# Patient Record
Sex: Female | Born: 1955 | Race: White | Hispanic: No | Marital: Married | Smoking: Former smoker
Health system: Southern US, Community
[De-identification: ages and names within clinical notes are randomized; demographics above are authoritative.]

## PROBLEM LIST (undated history)

## (undated) DIAGNOSIS — B029 Zoster without complications: Secondary | ICD-10-CM

## (undated) DIAGNOSIS — R918 Other nonspecific abnormal finding of lung field: Secondary | ICD-10-CM

## (undated) DIAGNOSIS — F32A Depression, unspecified: Secondary | ICD-10-CM

## (undated) DIAGNOSIS — E785 Hyperlipidemia, unspecified: Secondary | ICD-10-CM

## (undated) DIAGNOSIS — T7840XA Allergy, unspecified, initial encounter: Secondary | ICD-10-CM

## (undated) DIAGNOSIS — F329 Major depressive disorder, single episode, unspecified: Secondary | ICD-10-CM

## (undated) DIAGNOSIS — C801 Malignant (primary) neoplasm, unspecified: Secondary | ICD-10-CM

## (undated) DIAGNOSIS — F419 Anxiety disorder, unspecified: Secondary | ICD-10-CM

## (undated) HISTORY — DX: Hyperlipidemia, unspecified: E78.5

## (undated) HISTORY — PX: TONSILLECTOMY AND ADENOIDECTOMY: SUR1326

## (undated) HISTORY — DX: Major depressive disorder, single episode, unspecified: F32.9

## (undated) HISTORY — PX: RIGHT OOPHORECTOMY: SHX2359

## (undated) HISTORY — DX: Anxiety disorder, unspecified: F41.9

## (undated) HISTORY — DX: Depression, unspecified: F32.A

## (undated) HISTORY — DX: Allergy, unspecified, initial encounter: T78.40XA

## (undated) HISTORY — DX: Other nonspecific abnormal finding of lung field: R91.8

## (undated) HISTORY — DX: Zoster without complications: B02.9

## (undated) HISTORY — PX: WISDOM TOOTH EXTRACTION: SHX21

---

## 1998-05-29 ENCOUNTER — Other Ambulatory Visit: Admission: RE | Admit: 1998-05-29 | Discharge: 1998-05-29 | Payer: Self-pay | Admitting: Obstetrics and Gynecology

## 1999-06-13 ENCOUNTER — Other Ambulatory Visit: Admission: RE | Admit: 1999-06-13 | Discharge: 1999-06-13 | Payer: Self-pay | Admitting: Obstetrics and Gynecology

## 2000-06-20 ENCOUNTER — Other Ambulatory Visit: Admission: RE | Admit: 2000-06-20 | Discharge: 2000-06-20 | Payer: Self-pay | Admitting: Obstetrics and Gynecology

## 2001-07-02 ENCOUNTER — Other Ambulatory Visit: Admission: RE | Admit: 2001-07-02 | Discharge: 2001-07-02 | Payer: Self-pay | Admitting: Obstetrics and Gynecology

## 2002-07-06 ENCOUNTER — Other Ambulatory Visit: Admission: RE | Admit: 2002-07-06 | Discharge: 2002-07-06 | Payer: Self-pay | Admitting: Obstetrics and Gynecology

## 2003-09-01 ENCOUNTER — Other Ambulatory Visit: Admission: RE | Admit: 2003-09-01 | Discharge: 2003-09-01 | Payer: Self-pay | Admitting: Obstetrics and Gynecology

## 2004-05-15 ENCOUNTER — Encounter: Admission: RE | Admit: 2004-05-15 | Discharge: 2004-05-15 | Payer: Self-pay | Admitting: Obstetrics and Gynecology

## 2004-09-11 ENCOUNTER — Other Ambulatory Visit: Admission: RE | Admit: 2004-09-11 | Discharge: 2004-09-11 | Payer: Self-pay | Admitting: Obstetrics and Gynecology

## 2005-07-15 ENCOUNTER — Encounter: Admission: RE | Admit: 2005-07-15 | Discharge: 2005-07-15 | Payer: Self-pay | Admitting: Otolaryngology

## 2005-10-08 ENCOUNTER — Other Ambulatory Visit: Admission: RE | Admit: 2005-10-08 | Discharge: 2005-10-08 | Payer: Self-pay | Admitting: Obstetrics and Gynecology

## 2006-07-16 ENCOUNTER — Encounter: Admission: RE | Admit: 2006-07-16 | Discharge: 2006-07-16 | Payer: Self-pay | Admitting: Obstetrics and Gynecology

## 2006-10-14 ENCOUNTER — Other Ambulatory Visit: Admission: RE | Admit: 2006-10-14 | Discharge: 2006-10-14 | Payer: Self-pay | Admitting: Obstetrics and Gynecology

## 2008-06-24 HISTORY — PX: DENTAL SURGERY: SHX609

## 2008-09-14 ENCOUNTER — Encounter: Admission: RE | Admit: 2008-09-14 | Discharge: 2008-09-14 | Payer: Self-pay | Admitting: Family Medicine

## 2008-10-26 ENCOUNTER — Encounter: Admission: RE | Admit: 2008-10-26 | Discharge: 2008-10-26 | Payer: Self-pay | Admitting: Family Medicine

## 2008-11-08 ENCOUNTER — Encounter: Admission: RE | Admit: 2008-11-08 | Discharge: 2008-11-08 | Payer: Self-pay | Admitting: Family Medicine

## 2009-04-25 ENCOUNTER — Encounter: Admission: RE | Admit: 2009-04-25 | Discharge: 2009-04-25 | Payer: Self-pay | Admitting: Family Medicine

## 2009-11-15 ENCOUNTER — Encounter: Admission: RE | Admit: 2009-11-15 | Discharge: 2009-11-15 | Payer: Self-pay | Admitting: Family Medicine

## 2010-07-14 ENCOUNTER — Encounter: Payer: Self-pay | Admitting: Obstetrics and Gynecology

## 2010-07-15 ENCOUNTER — Encounter: Payer: Self-pay | Admitting: Orthopedic Surgery

## 2011-01-07 ENCOUNTER — Other Ambulatory Visit: Payer: Self-pay | Admitting: Family Medicine

## 2011-01-07 DIAGNOSIS — Z1231 Encounter for screening mammogram for malignant neoplasm of breast: Secondary | ICD-10-CM

## 2011-01-16 ENCOUNTER — Ambulatory Visit
Admission: RE | Admit: 2011-01-16 | Discharge: 2011-01-16 | Disposition: A | Discharge status: Home / Self Care | Payer: Private Health Insurance - Indemnity | Source: Ambulatory Visit | Attending: Family Medicine | Admitting: Family Medicine

## 2011-01-16 DIAGNOSIS — Z1231 Encounter for screening mammogram for malignant neoplasm of breast: Secondary | ICD-10-CM

## 2011-02-06 ENCOUNTER — Ambulatory Visit (AMBULATORY_SURGERY_CENTER): Payer: Private Health Insurance - Indemnity | Admitting: *Deleted

## 2011-02-06 VITALS — Ht 66.0 in | Wt 147.0 lb

## 2011-02-06 DIAGNOSIS — Z1211 Encounter for screening for malignant neoplasm of colon: Secondary | ICD-10-CM

## 2011-02-06 MED ORDER — PEG-KCL-NACL-NASULF-NA ASC-C 100 G PO SOLR: ORAL | Status: DC

## 2011-02-20 ENCOUNTER — Encounter: Payer: Self-pay | Admitting: Gastroenterology

## 2011-02-20 ENCOUNTER — Other Ambulatory Visit: Payer: Self-pay

## 2011-02-20 ENCOUNTER — Ambulatory Visit (AMBULATORY_SURGERY_CENTER): Payer: Private Health Insurance - Indemnity | Admitting: Gastroenterology

## 2011-02-20 VITALS — BP 125/77 | HR 65 | Temp 98.4°F | Resp 20 | Ht 66.0 in | Wt 147.0 lb

## 2011-02-20 DIAGNOSIS — D126 Benign neoplasm of colon, unspecified: Secondary | ICD-10-CM

## 2011-02-20 DIAGNOSIS — Z1211 Encounter for screening for malignant neoplasm of colon: Secondary | ICD-10-CM

## 2011-02-20 MED ORDER — HYDROCORTISONE ACE-PRAMOXINE 1-1 % RE CREA: TOPICAL_CREAM | Freq: Three times a day (TID) | RECTAL | Status: AC

## 2011-02-20 MED ORDER — PRAMOXINE-HC 1-2.5 % EX CREA: TOPICAL_CREAM | CUTANEOUS | Status: DC

## 2011-02-20 MED ORDER — SODIUM CHLORIDE 0.9 % IV SOLN: 500.0000 mL | INTRAVENOUS | Status: DC

## 2011-02-20 NOTE — Progress Notes (Signed)
Addended by: Harlow Mares D on: 02/20/2011 11:25 AM   Modules accepted: Orders

## 2011-02-20 NOTE — Patient Instructions (Signed)
Please refer to your blue and neon green sheets for instructions regarding diet and activity for the rest of today.  You may resume your medications as you would normally take them.  Hemorrhoids Hemorrhoids are dilated (enlarged) veins around the rectum. Sometimes clots will form in the veins. This makes them swollen and painful. These are called thrombosed hemorrhoids. Causes of hemorrhoids include:  Pregnancy: this increases the pressure in the hemorrhoidal veins.   Constipation.   Straining to have a bowel movement.  HOME CARE INSTRUCTIONS  Eat a well balanced diet and drink 6 to 8 glasses of water every day to avoid constipation. You may also use a bulk laxative.   Avoid straining to have bowel movements.   Keep anal area dry and clean.   Only take over-the-counter or prescription medicines for pain, discomfort, or fever as directed by your caregiver.  If thrombosed:  Take hot sitz baths for 20 to 30 minutes, 3 to 4 times per day.   If the hemorrhoids are very tender and swollen, place ice packs on area as tolerated. Using ice packs between sitz baths may be helpful. Fill a plastic bag with ice and use a towel between the bag of ice and your skin.   Special creams and suppositories (Anusol, Nupercainal, Wyanoids) may be used or applied as directed.   Do not use a donut shaped pillow or sit on the toilet for long periods. This increases blood pooling and pain.   Move your bowels when your body has the urge; this will require less straining and will decrease pain and pressure.   Only take over-the-counter or prescription medicines for pain, discomfort, or fever as directed by your caregiver.  SEEK MEDICAL CARE IF:  You have increasing pain and swelling that is not controlled with your prescription.   You have uncontrolled bleeding.   You have an inability or difficulty having a bowel movement.   You have pain or inflammation outside the area of the hemorrhoids.   You  have chills and/or an oral temperature that lasts for 2 days or longer, or as your caregiver suggests.  MAKE SURE YOU:   Understand these instructions.   Will watch your condition.   Will get help right away if you are not doing well or get worse.  Document Released: 06/07/2000 Document Re-Released: 05/23/2008 [facility] Patient Information 2011 [place], [place].  Polyps, Colon  A polyp is extra tissue that grows inside your body. Colon polyps grow in the large intestine. The large intestine, also called the colon, is part of your digestive system. It is a long, hollow tube at the end of your digestive tract where your body makes and stores stool. Most polyps are not dangerous. They are benign. This means they are not cancerous. But over time, some types of polyps can turn into cancer. Polyps that are smaller than a pea are usually not harmful. But larger polyps could someday become or may already be cancerous. To be safe, doctors remove all polyps and test them.  WHO GETS POLYPS? Anyone can get polyps, but certain people are more likely than others. You may have a greater chance of getting polyps if:  You are over 50.   You have had polyps before.   Someone in your family has had polyps.   Someone in your family has had cancer of the large intestine.   Find out if someone in your family has had polyps. You may also be more likely to get polyps if  you:   Eat a lot of fatty foods   Smoke   Drink alcohol   Do not exercise  Eat too much  SYMPTOMS Most small polyps do not cause symptoms. People often do not know they have one until their caregiver finds it during a regular checkup or while testing them for something else. Some people do have symptoms like these:  Bleeding from the anus. You might notice blood on your underwear or on toilet paper after you have had a bowel movement.   Constipation or diarrhea that lasts more than a week.   Blood in the stool. Blood can make stool look  black or it can show up as red streaks in the stool.  If you have any of these symptoms, see your caregiver. HOW DOES THE DOCTOR TEST FOR POLYPS? The doctor can use four tests to check for polyps:  Digital rectal exam. The caregiver wears gloves and checks your rectum (the last part of the large intestine) to see if it feels normal. This test would find polyps only in the rectum. Your caregiver may need to do one of the other tests listed below to find polyps higher up in the intestine.   Barium enema. The caregiver puts a liquid called barium into your rectum before taking x-rays of your large intestine. Barium makes your intestine look white in the pictures. Polyps are dark, so they are easy to see.   Sigmoidoscopy. With this test, the caregiver can see inside your large intestine. A thin flexible tube is placed into your rectum. The device is called a sigmoidoscope, which has a light and a tiny video camera in it. The caregiver uses the sigmoidoscope to look at the last third of your large intestine.   Colonoscopy. This test is like sigmoidoscopy, but the caregiver looks at all of the large intestine. It usually requires sedation. This is the most common method for finding and removing polyps.  TREATMENT  The caregiver will remove the polyp during sigmoidoscopy or colonoscopy. The polyp is then tested for cancer.   If you have had polyps, your caregiver may want you to get tested regularly in the future.  PREVENTION There is not one sure way to prevent polyps. You might be able to lower your risk of getting them if you:  Eat more fruits and vegetables and less fatty food.   Do not smoke.   Avoid alcohol.   Exercise every day.   Lose weight if you are overweight.   Eating more calcium and folate can also lower your risk of getting polyps. Some foods that are rich in calcium are milk, cheese, and broccoli. Some foods that are rich in folate are chickpeas, kidney beans, and spinach.    Aspirin might help prevent polyps. Studies are under way.  Document Released: 03/06/2004 Document Re-Released: 11/28/2009 [facility] Patient Information 2011 [place], [place].

## 2011-02-21 ENCOUNTER — Telehealth: Payer: Self-pay

## 2011-02-21 NOTE — Telephone Encounter (Signed)

## 2011-02-28 ENCOUNTER — Encounter: Payer: Self-pay | Admitting: Gastroenterology

## 2011-04-04 NOTE — Progress Notes (Signed)
Addended by: Maple Hudson on: 04/04/2011 09:51 AM   Modules accepted: Level of Service

## 2012-01-30 ENCOUNTER — Other Ambulatory Visit: Payer: Self-pay | Admitting: Family Medicine

## 2012-01-30 DIAGNOSIS — Z1231 Encounter for screening mammogram for malignant neoplasm of breast: Secondary | ICD-10-CM

## 2012-02-14 ENCOUNTER — Ambulatory Visit
Admission: RE | Admit: 2012-02-14 | Discharge: 2012-02-14 | Disposition: A | Discharge status: Home / Self Care | Payer: Private Health Insurance - Indemnity | Source: Ambulatory Visit | Attending: Family Medicine | Admitting: Family Medicine

## 2012-02-14 DIAGNOSIS — Z1231 Encounter for screening mammogram for malignant neoplasm of breast: Secondary | ICD-10-CM

## 2014-07-04 ENCOUNTER — Other Ambulatory Visit: Payer: Self-pay | Admitting: Family Medicine

## 2014-07-04 ENCOUNTER — Other Ambulatory Visit: Payer: Self-pay | Admitting: Physician Assistant

## 2014-07-04 DIAGNOSIS — Z1231 Encounter for screening mammogram for malignant neoplasm of breast: Secondary | ICD-10-CM

## 2014-07-08 ENCOUNTER — Ambulatory Visit: Payer: Private Health Insurance - Indemnity

## 2014-07-15 ENCOUNTER — Ambulatory Visit: Payer: Private Health Insurance - Indemnity

## 2014-07-19 ENCOUNTER — Ambulatory Visit
Admission: RE | Admit: 2014-07-19 | Discharge: 2014-07-19 | Disposition: A | Discharge status: Home / Self Care | Payer: 59 | Source: Ambulatory Visit | Attending: Physician Assistant | Admitting: Physician Assistant

## 2014-07-19 DIAGNOSIS — Z1231 Encounter for screening mammogram for malignant neoplasm of breast: Secondary | ICD-10-CM

## 2016-01-10 ENCOUNTER — Encounter: Payer: Self-pay | Admitting: Internal Medicine

## 2016-01-11 ENCOUNTER — Other Ambulatory Visit: Payer: Self-pay | Admitting: Family Medicine

## 2016-01-11 DIAGNOSIS — Z1231 Encounter for screening mammogram for malignant neoplasm of breast: Secondary | ICD-10-CM

## 2016-01-19 ENCOUNTER — Ambulatory Visit
Admission: RE | Admit: 2016-01-19 | Discharge: 2016-01-19 | Disposition: A | Discharge status: Home / Self Care | Payer: 59 | Source: Ambulatory Visit | Attending: Family Medicine | Admitting: Family Medicine

## 2016-01-19 DIAGNOSIS — Z1231 Encounter for screening mammogram for malignant neoplasm of breast: Secondary | ICD-10-CM

## 2017-07-22 ENCOUNTER — Encounter: Payer: Self-pay | Admitting: Internal Medicine

## 2017-08-26 ENCOUNTER — Encounter (HOSPITAL_BASED_OUTPATIENT_CLINIC_OR_DEPARTMENT_OTHER): Payer: Self-pay

## 2017-08-26 ENCOUNTER — Other Ambulatory Visit: Payer: Self-pay | Admitting: Family Medicine

## 2017-08-26 ENCOUNTER — Ambulatory Visit (HOSPITAL_BASED_OUTPATIENT_CLINIC_OR_DEPARTMENT_OTHER)
Admission: RE | Admit: 2017-08-26 | Discharge: 2017-08-26 | Disposition: A | Discharge status: Home / Self Care | Payer: 59 | Source: Ambulatory Visit | Attending: Family Medicine | Admitting: Family Medicine

## 2017-08-26 DIAGNOSIS — I700 Atherosclerosis of aorta: Secondary | ICD-10-CM | POA: Insufficient documentation

## 2017-08-26 DIAGNOSIS — R918 Other nonspecific abnormal finding of lung field: Secondary | ICD-10-CM | POA: Diagnosis not present

## 2017-08-26 MED ORDER — IOPAMIDOL (ISOVUE-300) INJECTION 61%
100.0000 mL | Freq: Once | INTRAVENOUS | Status: AC | PRN
  Administered 2017-08-26: 80 mL via INTRAVENOUS

## 2017-09-10 ENCOUNTER — Other Ambulatory Visit: Payer: Self-pay | Admitting: *Deleted

## 2017-09-10 ENCOUNTER — Encounter: Payer: Self-pay | Admitting: Cardiothoracic Surgery

## 2017-09-10 ENCOUNTER — Institutional Professional Consult (permissible substitution): Payer: 59 | Admitting: Cardiothoracic Surgery

## 2017-09-10 VITALS — BP 123/68 | HR 79 | Resp 20 | Ht 65.0 in | Wt 127.0 lb

## 2017-09-10 DIAGNOSIS — R918 Other nonspecific abnormal finding of lung field: Secondary | ICD-10-CM | POA: Diagnosis not present

## 2017-09-10 DIAGNOSIS — R911 Solitary pulmonary nodule: Secondary | ICD-10-CM

## 2017-09-10 NOTE — Patient Instructions (Signed)
Pulmonary Nodule A pulmonary nodule is a small, round growth of tissue in the lung. Pulmonary nodules can range in size from less than 1/5 inch (4 mm) to a little bigger than an inch (25 mm). Most pulmonary nodules are detected when imaging tests of the lung are being performed for a different problem. Pulmonary nodules are usually not cancerous (benign). However, some pulmonary nodules are cancerous (malignant). Follow-up treatment or testing is based on the size of the pulmonary nodule and your risk of getting lung cancer. What are the causes? Benign pulmonary nodules can be caused by various things. Some of the causes include:  Bacterial, fungal, or viral infections. This is usually an old infection that is no longer active, but it can sometimes be a current, active infection.  A benign mass of tissue.  Inflammation from conditions such as rheumatoid arthritis.  Abnormal blood vessels in the lungs.  Malignant pulmonary nodules can result from lung cancer or from cancers that spread to the lung from other places in the body. What are the signs or symptoms? Pulmonary nodules usually do not cause symptoms. How is this diagnosed? Most often, pulmonary nodules are found incidentally when an X-ray or CT scan is performed to look for some other problem in the lung area. To help determine whether a pulmonary nodule is benign or malignant, your health care provider will take a medical history and order a variety of tests. Tests done may include:  Blood tests.  A skin test called a tuberculin test. This test is used to determine if you have been exposed to the germ that causes tuberculosis.  Chest X-rays. If possible, a new X-ray may be compared with X-rays you have had in the past.  CT scan. This test shows smaller pulmonary nodules more clearly than an X-ray.  Positron emission tomography (PET) scan. In this test, a safe amount of a radioactive substance is injected into the bloodstream. Then,  the scan takes a picture of the pulmonary nodule. The radioactive substance is eliminated from your body in your urine.  Biopsy. A tiny piece of the pulmonary nodule is removed so it can be checked under a microscope.  How is this treated? Pulmonary nodules that are benign normally do not require any treatment because they usually do not cause symptoms or breathing problems. Your health care provider may want to monitor the pulmonary nodule through follow-up CT scans. The frequency of these CT scans will vary based on the size of the nodule and the risk factors for lung cancer. For example, CT scans will need to be done more frequently if the pulmonary nodule is larger and if you have a history of smoking and a family history of cancer. Further testing or biopsies may be done if any follow-up CT scan shows that the size of the pulmonary nodule has increased. Follow these instructions at home:  Only take over-the-counter or prescription medicines as directed by your health care provider.  Keep all follow-up appointments with your health care provider. Contact a health care provider if:  You have trouble breathing when you are active.  You feel sick or unusually tired.  You do not feel like eating.  You lose weight without trying to.  You develop chills or night sweats. Get help right away if:  You cannot catch your breath, or you begin wheezing.  You cannot stop coughing.  You cough up blood.  You become dizzy or feel like you are going to pass out.  You  have sudden chest pain.  You have a fever or persistent symptoms for more than 2-3 days.  You have a fever and your symptoms suddenly get worse. This information is not intended to replace advice given to you by your health care provider. Make sure you discuss any questions you have with your health care provider. Document Released: 04/07/2009 Document Revised: 11/16/2015 Document Reviewed: 11/30/2012 Elsevier Interactive  Patient Education  2017 [place] Lung cancer occurs when abnormal cells in the lung grow out of control and form a mass (tumor). There are several types of lung cancer. The two most common types are:  Non-small cell. In this type of lung cancer, abnormal cells are larger and grow more slowly than those of small cell lung cancer.  Small cell. In this type of lung cancer, abnormal cells are smaller than those of non-small cell lung cancer. Small cell lung cancer gets worse faster than non-small cell lung cancer.  What are the causes? The leading cause of lung cancer is smoking tobacco. The second leading cause is radon exposure. What increases the risk?  Smoking tobacco.  Exposure to secondhand tobacco smoke.  Exposure to radon gas.  Exposure to asbestos.  Exposure to arsenic in drinking water.  Air pollution.  Family or personal history of lung cancer.  Lung radiation therapy.  Being older than 70 years. What are the signs or symptoms? In the early stages, symptoms may not be present. As the cancer progresses, symptoms may include:  A lasting cough, possibly with blood.  Fatigue.  Unexplained weight loss.  Shortness of breath.  Wheezing.  Chest pain.  Loss of appetite.  Symptoms of advanced lung cancer include:  Hoarseness.  Bone or joint pain.  Weakness.  Nail problems.  Face or arm swelling.  Paralysis of the face.  Drooping eyelids.  How is this diagnosed? Lung cancer can be identified with a physical exam and with tests such as:  A chest X-ray.  A CT scan.  Blood tests.  A biopsy.  After a diagnosis is made, you will have more tests to determine the stage of the cancer. The stages of non-small cell lung cancer are:  Stage 0, also called carcinoma in situ. At this stage, abnormal cells are found in the inner lining of your lung or lungs.  Stage I. At this stage, abnormal cells have grown into a tumor that is no larger  than 5 cm across. The cancer has entered the deeper lung tissue but has not yet entered the lymph nodes or other parts of the body.  Stage II. At this stage, the tumor is 7 cm across or smaller and has entered nearby lymph nodes. Or, the tumor is 5 cm across or smaller and has invaded surrounding tissue but is not found in nearby lymph nodes. There may be more than one tumor present.  Stage III. At this stage, the tumor may be any size. There may be more than one tumor in the lungs. The cancer cells have spread to the lymph nodes and possibly to other organs.  Stage IV. At this stage, there are tumors in both lungs and the cancer has spread to other areas of the body.  The stages of small cell lung cancer are:  Limited. At this stage, the cancer is found only on one side of the chest.  Extensive. At this stage, the cancer is in the lungs and in tissues on the other side of the chest. The cancer  has spread to other organs or is found in the fluid between the layers of your lungs.  How is this treated? Depending on the type and stage of your lung cancer, you may be treated with:  Surgery. This is done to remove a tumor.  Radiation therapy. This treatment destroys cancer cells using X-rays or other types of radiation.  Chemotherapy. This treatment uses medicines to destroy cancer cells.  Targeted therapy. This treatment aims to destroy only cancer cells instead of all cells as other therapies do.  You may also have a combination of treatments. Follow these instructions at home:  Do not use any tobacco products. This includes cigarettes, chewing tobacco, and electronic cigarettes. If you need help quitting, ask your health care provider.  Take medicines only as directed by your health care provider.  Eat a healthy diet. Work with a dietitian to make sure you are getting the nutrition you need.  Consider joining a support group or seeking counseling to help you cope with the stress of  having lung cancer.  Let your cancer specialist (oncologist) know if you are admitted to the hospital.  Keep all follow-up visits as directed by your health care provider. This is important. Contact a health care provider if:  You lose weight without trying.  You have a persistent cough and wheezing.  You feel short of breath.  You tire easily.  You experience bone or joint pain.  You have difficulty swallowing.  You feel hoarse or notice your voice changing.  Your pain medicine is not helping. Get help right away if:  You cough up blood.  You have new breathing problems.  You develop chest pain.  You develop swelling in: ? One or both ankles or legs. ? Your face, neck, or arms.  You are confused.  You experience paralysis in your face or a drooping eyelid. This information is not intended to replace advice given to you by your health care provider. Make sure you discuss any questions you have with your health care provider. Document Released: 09/16/2000 Document Revised: 11/16/2015 Document Reviewed: 10/14/2013 Elsevier Interactive Patient Education  [employer].

## 2017-09-10 NOTE — Progress Notes (Signed)
[address]       [place] [address]             [phone]                    Brandi Crosby [place] Medical Record #[id number] Date of Birth: May 11, 1956  Referring: Kieth Brightly Primary Care: [name], MD Primary Cardiologist: No primary care provider on file.  Chief Complaint:    Chief Complaint  Patient presents with  . Lung Mass    Chest CT 08/26/2017    History of Present Illness:    Brandi Crosby 62 y.o. female is seen in the office  today for evaluation of left lung mass.  The patient has been a smoker for 20 years, most of this was proximally half pack a day.  She is now down to smoking 3 cigarettes a day.  She began having tooth and mouth pain, was seen by endodontist x-rays there suggested sinus infection rather than true dental issue.  She became violently ill with Augmentin treatment with nausea and vomiting.  While being evaluated for the sinus problems she was noted to be wheezing and a chest x-ray was obtained.  This led to a CT scan and referral because of  left lung mass      Current Activity/ Functional Status:  Patient is independent with mobility/ambulation, transfers, ADL's, IADL's.   Zubrod Score: At the time of surgery this patient's most appropriate activity status/level should be described as: [x]     0    Normal activity, no symptoms []     1    Restricted in physical strenuous activity but ambulatory, able to do out light work []     2    Ambulatory and capable of self care, unable to do work activities, up and about               >50 % of waking hours                              []     3    Only limited self care, in bed greater than 50% of waking hours []     4    Completely disabled, no self care, confined to bed or chair []     5    Moribund   Past Medical History:  Diagnosis Date  . Allergy   . Anxiety   . Depression   . Hyperlipemia   . Lung mass   . Shingles     Past Surgical History:   Procedure Laterality Date  . DENTAL SURGERY  2010   implant  . RIGHT OOPHORECTOMY    . TONSILLECTOMY AND ADENOIDECTOMY    . WISDOM TOOTH EXTRACTION      Family History  Problem Relation Age of Onset  . Melanoma Mother   . Early death Mother   . Alzheimer's disease Father   . Hyperlipidemia Father   . Heart disease Sister     Social History   Socioeconomic History  . Marital status: Married    Spouse name: Not on file  . Number of children: Not on file  . Years of education: Not on file  . Highest education level: Not on file  Occupational History  .  Works as Forensic psychologist  Tobacco Use  . Smoking status: Current Every Day Smoker  Packs/day: 0.50    Years: 15.00    Pack years: 7.50    Types: Cigarettes  . Smokeless tobacco: Never Used  Substance and Sexual Activity  . Alcohol use: Yes    Alcohol/week: 1.2 oz    Types: 2 Glasses of wine per week  . Drug use: No  . Sexual activity: Not on file    Social History   Tobacco Use  Smoking Status Current Every Day Smoker  . Packs/day: 0.50  . Years: 15.00  . Pack years: 7.50  . Types: Cigarettes  Smokeless Tobacco Never Used    Social History   Substance and Sexual Activity  Alcohol Use Yes  . Alcohol/week: 1.2 oz  . Types: 2 Glasses of wine per week     Allergies  Allergen Reactions  . Augmentin [Amoxicillin-Pot Clavulanate] Diarrhea and Nausea And Vomiting  . Penicillins Nausea And Vomiting    Current Outpatient Medications  Medication Sig Dispense Refill  . ALPRAZolam (XANAX) 0.5 MG tablet Take 0.25 mg by mouth as needed. Nerves or sleep    . Ascorbic Acid (VITAMIN C) 1000 MG tablet Take 1,000 mg by mouth daily.      . cholecalciferol (VITAMIN D) 1000 units tablet Take 1,000 Units by mouth daily.    [name] escitalopram (LEXAPRO) 10 MG tablet Take 10 mg by mouth Daily.    [name] ibuprofen (ADVIL,MOTRIN) 200 MG tablet Take 400 mg by mouth every 6 (six) hours as needed.       No current  facility-administered medications for this visit.     Pertinent items are noted in HPI.   Review of Systems:  Review of Systems  Constitutional: Positive for malaise/fatigue. Negative for diaphoresis, fever and weight loss.  HENT: Positive for congestion and sinus pain. Negative for ear discharge, ear pain, hearing loss, sore throat and tinnitus.   Eyes: Negative.   Respiratory: Positive for cough and wheezing. Negative for hemoptysis, sputum production, shortness of breath and stridor.   Cardiovascular: Negative.   Gastrointestinal: Negative.  Negative for abdominal pain, diarrhea and vomiting.  Genitourinary: Negative.   Musculoskeletal: Negative.   Skin: Negative.   Neurological: Negative.   Endo/Heme/Allergies: Negative.   Psychiatric/Behavioral: Negative.     Immunizations: Flu up to date [ n ]; Pneumococcal up to date [ n ];   Physical Exam: BP 123/68   Pulse 79   Resp 20   Ht 5\' 5"  (1.651 m)   Wt 127 lb (57.6 kg)   SpO2 98%   BMI 21.13 kg/m   PHYSICAL EXAMINATION: General appearance: alert, cooperative, appears stated age and no distress Head: Normocephalic, without obvious abnormality, atraumatic Neck: no adenopathy, no carotid bruit, no JVD, supple, symmetrical, trachea midline and thyroid not enlarged, symmetric, no tenderness/mass/nodules Lymph nodes: Cervical, supraclavicular, and axillary nodes normal. Resp: clear to auscultation bilaterally Back: symmetric, no curvature. ROM normal. No CVA tenderness. Cardio: regular rate and rhythm, S1, S2 normal, no murmur, click, rub or gallop GI: soft, non-tender; bowel sounds normal; no masses,  no organomegaly Extremities: extremities normal, atraumatic, no cyanosis or edema and Homans sign is negative, no sign of DVT Neurologic: Grossly normal Palpable radial DP and PT pulses  Diagnostic Studies & Laboratory data:     Recent Radiology Findings:   Ct Chest W Contrast  Result Date: 08/26/2017 CLINICAL DATA:   Abnormal chest radiography yesterday.  Smoker. EXAM: CT CHEST WITH CONTRAST TECHNIQUE: Multidetector CT imaging of the chest was performed during intravenous contrast administration. CONTRAST:  19mL ISOVUE-300  IOPAMIDOL (ISOVUE-300) INJECTION 61% COMPARISON:  Chest radiography 08/25/2017 FINDINGS: Cardiovascular: Heart size is normal. There is ordinary aortic atherosclerosis without aneurysm or dissection. Minimal coronary artery calcification evident. Mediastinum/Nodes: No enlarged mediastinal or hilar lymph nodes on either side. Lungs/Pleura: In the right lung, there is a 2 mm calcified granuloma laterally on image 49. There are a few other tiny subpleural nodular shadows which are not significant. In the left lung, there is a 3-3.5 cm mass like density anteriorly in the midportion of the left lower lobe with a broad surface along the major fissure. Branching tubular densities emanate from the inferior margin consistent with obstructed bronchi. Although the differential diagnosis does include a very large example of bronchial atresia, this certainly could represent a malignant pulmonary lesion. There is a small cluster of tree-in-bud pulmonary opacities extending superior to the main lesion, as expected with more proximal bronchial obstruction. Medially in the left lower lobe in the superior segment, image 67, there is a 3 mm opacity which is nonspecific. Upper Abdomen: Adrenal glands are within normal limits. No significant upper abdominal finding. Musculoskeletal: Curvature in degenerative changes of the spine. No skeletal destructive lesions are seen. IMPRESSION: 3-3.5 cm mass anteriorly within the midportion of the left lower lobe with a broad surface along the major fissure. Malignancy is the primary concern. No sign of metastatic adenopathy. Multi disciplinary thoracic Oncology referral would be appropriate in this case. Aortic Atherosclerosis (ICD10-I70.0). Electronically Signed   By: Nelson Chimes M.D.    On: 08/26/2017 14:57     I have independently reviewed the above radiology studies  and reviewed the findings with the patient.   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   As soon as the PET scan is done I will see the patient back in office in proceed with further workup and evaluation.      Grace Isaac MD      [address] [place] [address] Office [phone]   Beeper [id number]  09/11/2017 8:42 AM

## 2017-09-15 ENCOUNTER — Ambulatory Visit (HOSPITAL_COMMUNITY)
Admission: RE | Admit: 2017-09-15 | Discharge: 2017-09-15 | Disposition: A | Discharge status: Home / Self Care | Payer: 59 | Source: Ambulatory Visit | Attending: Cardiothoracic Surgery | Admitting: Cardiothoracic Surgery

## 2017-09-15 DIAGNOSIS — R918 Other nonspecific abnormal finding of lung field: Secondary | ICD-10-CM | POA: Diagnosis present

## 2017-09-15 DIAGNOSIS — J449 Chronic obstructive pulmonary disease, unspecified: Secondary | ICD-10-CM | POA: Insufficient documentation

## 2017-09-15 LAB — PULMONARY FUNCTION TEST
DL/VA % pred: 97 %
DL/VA: 4.8 ml/min/mmHg/L
DLCO unc % pred: 81 %
DLCO unc: 20.79 ml/min/mmHg
FEF 25-75 Post: 1.83 L/sec
FEF 25-75 Pre: 1.07 L/sec
FEF2575-%Change-Post: 71 %
FEF2575-%Pred-Post: 76 %
FEF2575-%Pred-Pre: 44 %
FEV1-%Change-Post: 16 %
FEV1-%Pred-Post: 76 %
FEV1-%Pred-Pre: 65 %
FEV1-Post: 2.02 L
FEV1-Pre: 1.73 L
FEV1FVC-%Change-Post: 1 %
FEV1FVC-%Pred-Pre: 87 %
FEV6-%Change-Post: 13 %
FEV6-%Pred-Post: 87 %
FEV6-%Pred-Pre: 77 %
FEV6-Post: 2.88 L
FEV6-Pre: 2.54 L
FEV6FVC-%Change-Post: -1 %
FEV6FVC-%Pred-Post: 102 %
FEV6FVC-%Pred-Pre: 104 %
FVC-%Change-Post: 15 %
FVC-%Pred-Post: 85 %
FVC-%Pred-Pre: 74 %
FVC-Post: 2.93 L
FVC-Pre: 2.54 L
Post FEV1/FVC ratio: 69 %
Post FEV6/FVC ratio: 98 %
Pre FEV1/FVC ratio: 68 %
Pre FEV6/FVC Ratio: 100 %
RV % pred: 124 %
RV: 2.55 L
TLC % pred: 97 %
TLC: 5.09 L

## 2017-09-15 MED ORDER — ALBUTEROL SULFATE (2.5 MG/3ML) 0.083% IN NEBU
2.5000 mg | INHALATION_SOLUTION | Freq: Once | RESPIRATORY_TRACT | Status: AC
  Administered 2017-09-15: 2.5 mg via RESPIRATORY_TRACT

## 2017-09-18 ENCOUNTER — Encounter (HOSPITAL_COMMUNITY)
Admission: RE | Admit: 2017-09-18 | Discharge: 2017-09-18 | Disposition: A | Discharge status: Home / Self Care | Payer: 59 | Source: Ambulatory Visit | Attending: Cardiothoracic Surgery | Admitting: Cardiothoracic Surgery

## 2017-09-18 DIAGNOSIS — R918 Other nonspecific abnormal finding of lung field: Secondary | ICD-10-CM | POA: Diagnosis not present

## 2017-09-18 LAB — GLUCOSE, CAPILLARY: Glucose-Capillary: 99 mg/dL (ref 65–99)

## 2017-09-18 MED ORDER — FLUDEOXYGLUCOSE F - 18 (FDG) INJECTION
6.2000 | Freq: Once | INTRAVENOUS | Status: AC | PRN
  Administered 2017-09-18: 6.2 via INTRAVENOUS

## 2017-09-19 ENCOUNTER — Other Ambulatory Visit: Payer: Self-pay | Admitting: *Deleted

## 2017-09-19 ENCOUNTER — Ambulatory Visit: Payer: 59 | Admitting: Cardiothoracic Surgery

## 2017-09-19 VITALS — BP 140/84 | HR 88 | Resp 20 | Ht 65.0 in | Wt 126.0 lb

## 2017-09-19 DIAGNOSIS — R918 Other nonspecific abnormal finding of lung field: Secondary | ICD-10-CM

## 2017-09-19 DIAGNOSIS — J984 Other disorders of lung: Secondary | ICD-10-CM

## 2017-09-19 NOTE — Patient Instructions (Signed)
Pulmonary Nodule A pulmonary nodule is a small, round growth of tissue in the lung. Pulmonary nodules can range in size from less than 1/5 inch (4 mm) to a little bigger than an inch (25 mm). Most pulmonary nodules are detected when imaging tests of the lung are being performed for a different problem. Pulmonary nodules are usually not cancerous (benign). However, some pulmonary nodules are cancerous (malignant). Follow-up treatment or testing is based on the size of the pulmonary nodule and your risk of getting lung cancer. What are the causes? Benign pulmonary nodules can be caused by various things. Some of the causes include:  Bacterial, fungal, or viral infections. This is usually an old infection that is no longer active, but it can sometimes be a current, active infection.  A benign mass of tissue.  Inflammation from conditions such as rheumatoid arthritis.  Abnormal blood vessels in the lungs.  Malignant pulmonary nodules can result from lung cancer or from cancers that spread to the lung from other places in the body. What are the signs or symptoms? Pulmonary nodules usually do not cause symptoms. How is this diagnosed? Most often, pulmonary nodules are found incidentally when an X-ray or CT scan is performed to look for some other problem in the lung area. To help determine whether a pulmonary nodule is benign or malignant, your health care provider will take a medical history and order a variety of tests. Tests done may include:  Blood tests.  A skin test called a tuberculin test. This test is used to determine if you have been exposed to the germ that causes tuberculosis.  Chest X-rays. If possible, a new X-ray may be compared with X-rays you have had in the past.  CT scan. This test shows smaller pulmonary nodules more clearly than an X-ray.  Positron emission tomography (PET) scan. In this test, a safe amount of a radioactive substance is injected into the bloodstream. Then,  the scan takes a picture of the pulmonary nodule. The radioactive substance is eliminated from your body in your urine.  Biopsy. A tiny piece of the pulmonary nodule is removed so it can be checked under a microscope.  How is this treated? Pulmonary nodules that are benign normally do not require any treatment because they usually do not cause symptoms or breathing problems. Your health care provider may want to monitor the pulmonary nodule through follow-up CT scans. The frequency of these CT scans will vary based on the size of the nodule and the risk factors for lung cancer. For example, CT scans will need to be done more frequently if the pulmonary nodule is larger and if you have a history of smoking and a family history of cancer. Further testing or biopsies may be done if any follow-up CT scan shows that the size of the pulmonary nodule has increased. Follow these instructions at home:  Only take over-the-counter or prescription medicines as directed by your health care provider.  Keep all follow-up appointments with your health care provider. Contact a health care provider if:  You have trouble breathing when you are active.  You feel sick or unusually tired.  You do not feel like eating.  You lose weight without trying to.  You develop chills or night sweats. Get help right away if:  You cannot catch your breath, or you begin wheezing.  You cannot stop coughing.  You cough up blood.  You become dizzy or feel like you are going to pass out.  You  have sudden chest pain.  You have a fever or persistent symptoms for more than 2-3 days.  You have a fever and your symptoms suddenly get worse. This information is not intended to replace advice given to you by your health care provider. Make sure you discuss any questions you have with your health care provider. Document Released: 04/07/2009 Document Revised: 11/16/2015 Document Reviewed: 11/30/2012 Elsevier Interactive  Patient Education  2017 [place] Lung cancer occurs when abnormal cells in the lung grow out of control and form a mass (tumor). There are several types of lung cancer. The two most common types are:  Non-small cell. In this type of lung cancer, abnormal cells are larger and grow more slowly than those of small cell lung cancer.  Small cell. In this type of lung cancer, abnormal cells are smaller than those of non-small cell lung cancer. Small cell lung cancer gets worse faster than non-small cell lung cancer.  What are the causes? The leading cause of lung cancer is smoking tobacco. The second leading cause is radon exposure. What increases the risk?  Smoking tobacco.  Exposure to secondhand tobacco smoke.  Exposure to radon gas.  Exposure to asbestos.  Exposure to arsenic in drinking water.  Air pollution.  Family or personal history of lung cancer.  Lung radiation therapy.  Being older than 16 years. What are the signs or symptoms? In the early stages, symptoms may not be present. As the cancer progresses, symptoms may include:  A lasting cough, possibly with blood.  Fatigue.  Unexplained weight loss.  Shortness of breath.  Wheezing.  Chest pain.  Loss of appetite.  Symptoms of advanced lung cancer include:  Hoarseness.  Bone or joint pain.  Weakness.  Nail problems.  Face or arm swelling.  Paralysis of the face.  Drooping eyelids.  How is this diagnosed? Lung cancer can be identified with a physical exam and with tests such as:  A chest X-ray.  A CT scan.  Blood tests.  A biopsy.  After a diagnosis is made, you will have more tests to determine the stage of the cancer. The stages of non-small cell lung cancer are:  Stage 0, also called carcinoma in situ. At this stage, abnormal cells are found in the inner lining of your lung or lungs.  Stage I. At this stage, abnormal cells have grown into a tumor that is no larger  than 5 cm across. The cancer has entered the deeper lung tissue but has not yet entered the lymph nodes or other parts of the body.  Stage II. At this stage, the tumor is 7 cm across or smaller and has entered nearby lymph nodes. Or, the tumor is 5 cm across or smaller and has invaded surrounding tissue but is not found in nearby lymph nodes. There may be more than one tumor present.  Stage III. At this stage, the tumor may be any size. There may be more than one tumor in the lungs. The cancer cells have spread to the lymph nodes and possibly to other organs.  Stage IV. At this stage, there are tumors in both lungs and the cancer has spread to other areas of the body.  The stages of small cell lung cancer are:  Limited. At this stage, the cancer is found only on one side of the chest.  Extensive. At this stage, the cancer is in the lungs and in tissues on the other side of the chest. The cancer  has spread to other organs or is found in the fluid between the layers of your lungs.  How is this treated? Depending on the type and stage of your lung cancer, you may be treated with:  Surgery. This is done to remove a tumor.  Radiation therapy. This treatment destroys cancer cells using X-rays or other types of radiation.  Chemotherapy. This treatment uses medicines to destroy cancer cells.  Targeted therapy. This treatment aims to destroy only cancer cells instead of all cells as other therapies do.  You may also have a combination of treatments. Follow these instructions at home:  Do not use any tobacco products. This includes cigarettes, chewing tobacco, and electronic cigarettes. If you need help quitting, ask your health care provider.  Take medicines only as directed by your health care provider.  Eat a healthy diet. Work with a dietitian to make sure you are getting the nutrition you need.  Consider joining a support group or seeking counseling to help you cope with the stress of  having lung cancer.  Let your cancer specialist (oncologist) know if you are admitted to the hospital.  Keep all follow-up visits as directed by your health care provider. This is important. Contact a health care provider if:  You lose weight without trying.  You have a persistent cough and wheezing.  You feel short of breath.  You tire easily.  You experience bone or joint pain.  You have difficulty swallowing.  You feel hoarse or notice your voice changing.  Your pain medicine is not helping. Get help right away if:  You cough up blood.  You have new breathing problems.  You develop chest pain.  You develop swelling in: ? One or both ankles or legs. ? Your face, neck, or arms.  You are confused.  You experience paralysis in your face or a drooping eyelid. This information is not intended to replace advice given to you by your health care provider. Make sure you discuss any questions you have with your health care provider. Document Released: 09/16/2000 Document Revised: 11/16/2015 Document Reviewed: 10/14/2013 Elsevier Interactive Patient Education  2018 [employer].  Lung Resection A lung resection is a procedure to remove part or all of a lung. When an entire lung is removed, the procedure is called a pneumonectomy. When only part of a lung is removed, the procedure is called a lobectomy. A lung resection is typically done to get rid of a tumor or cancer, but it may be done to treat other conditions. This procedure can help relieve some or all of your symptoms and can also help keep the problem from getting worse. Lung resection may provide the best chance for curing your disease. However, the procedure may not necessarily cure lung cancer if that is the problem. Tell a health care provider about:  Any allergies you have.  All medicines you are taking, including vitamins, herbs, eye drops, creams, and over-the-counter medicines.  Any problems you or family  members have had with anesthetic medicines.  Any blood disorders you have.  Any surgeries you have had.  Any medical conditions you have. What are the risks? Generally, lung resection is a safe procedure. However, problems can occur and include:  Excessive bleeding.  Infection.  Inability to breathe without a ventilator.  Persistent shortness of breath.  Heart problems, including abnormal rhythms and a risk of heart attack or heart failure.  Blood clots.  Injury to a blood vessel.  Injury to a nerve.  Failure to heal properly.  Stroke.  Bronchopleural fistula. This is a small hole between one of the main breathing tubes (bronchus) and the lining of the lungs. This is rare.  Reaction to anesthesia.  What happens before the procedure? You may have tests done before the procedure, including:  Blood tests.  Urine tests.  X-rays.  Other imaging tests (such as CT scans, MRI scans, and PET scans). These tests are done to find the exact size and location of the condition being treated with this surgery.  Pulmonary function tests. These are breathing tests to assess the function of your lungs before surgery and to decide how to best help your breathing after surgery.  Heart testing. This is done to make sure your heart is strong enough for the procedure.  Bronchoscopy. This is a technique that allows your health care provider to look at the inside of your airways. This is done using a soft, flexible tube (bronchoscope). Along with imaging tests, this can help your health care provider know the exact location and size of the area that will be removed during surgery.  Lymph node sampling. This may need to be done to see if the tumor has spread. It may be done as a separate surgery or right before your lung resection procedure.  What happens during the procedure?  An IV tube will be placed in your arm. You will be given a medicine that makes you fall asleep (general  anesthetic). You may also get pain medicine through a thin, flexible tube (catheter) in your back.  A breathing tube will be placed in your throat.  Once the surgical team has prepared you for surgery, your surgeon will make an incision on your side. Some resections are done through large incisions, while others can be done through small incisions using smaller instruments and assisted with small cameras (laparoscopic surgery).  Your surgeon will carefully cut the veins, arteries, and bronchus leading to your lung. After being cut, each of these pieces will be sewn or stapled closed. The lung or part of the lung will then be removed.  Your surgeon will check inside your chest to make sure there is no bleeding in or around the lungs. Lymph nodes near the lung may also be removed for later tests.  Your surgeon may put tubes into your chest to drain extra fluid and air after surgery.  Your incision will be closed. This may be done using: ? Stitches that absorb into your body and do not need to be removed. ? Stitches that must be removed. ? Staples that must be removed. What happens after the procedure?  You will be taken to the recovery area and your progress will be monitored. You may still have a breathing tube and other tubes or catheters in your body immediately after surgery. These will be removed during your recovery. You may be put on a respirator following surgery if some assistance is needed to help your breathing. When you are awake and not experiencing immediate problems from surgery, you will be moved to the intensive care unit (ICU) where you will continue your recovery.  You may feel pain in your chest and throat. Sometimes during recovery, patients may shiver or feel nauseous. You will be given medicine to help with pain and nausea.  The breathing tube will be taken out as soon as your health care providers feel you can breathe on your own. For most people, this happens on the same  day as the  surgery.  If your surgery and time in the ICU go well, most of the tubes and equipment will be taken out within 1-2 days after surgery. This is about how long most people stay in the ICU. You may need to stay longer, depending on how you are doing.  You should also start respiratory therapy in the ICU. This therapy uses breathing exercises to help your other lung stay healthy and get stronger.  As you improve, you will be moved to a regular hospital room for continued respiratory therapy, help with your bladder and bowels, and to continue medicines.  After your lung or part of your lung is taken out, there will be a space inside your chest. This space will often fill up with fluid over time. The amount of time this takes is different for each person.  You will receive care until you are doing well and your health care provider feels it is safe for you to go home or to transfer to an extended care facility. This information is not intended to replace advice given to you by your health care provider. Make sure you discuss any questions you have with your health care provider. Document Released: 08/31/2002 Document Revised: 11/16/2015 Document Reviewed: 07/30/2013 Elsevier Interactive Patient Education  2018 [place].  Lung Resection, Care After Refer to this sheet in the next few weeks. These instructions provide you with information on caring for yourself after your procedure. Your health care provider may also give you more specific instructions. Your treatment has been planned according to current medical practices, but problems sometimes occur. Call your health care provider if you have any problems or questions after your procedure. What can I expect after the procedure? After your procedure, it is typical to have the following:  You may feel pain in your chest and throat.  Patients may sometimes shiver or feel nauseous during recovery.  Follow these instructions at  home:  You may resume a normal diet and activities as directed by your health care provider.  Do not use any tobacco products, including cigarettes, chewing tobacco, or electronic cigarettes. If you need help quitting, ask your health care provider.  There are many different ways to close and cover an incision, including stitches, skin glue, and adhesive strips. Follow your health care provider's instructions on: ? Incision care. ? Bandage (dressing) changes and removal. ? Incision closure removal.  Take medicines only as directed by your health care provider.  Keep all follow-up visits as directed by your health care provider. This is important.  Try to breathe deeply and cough as directed. Holding a pillow firmly over your ribs may help with discomfort.  If you were given an incentive spirometer in the hospital, continue to use it as directed by your health care provider.  Walk as directed by your health care provider.  You may take a shower and gently wash the area of your incision with water and soap as directed by your health care provider. Do not use anything else to clean your incision except as directed by your health care provider. Do not take baths, swim, or use a hot tub until your health care provider approves. Contact a health care provider if:  You notice redness, swelling, or increasing pain at the incision site.  You are bleeding at the incision site.  You see pus coming from the incision site.  You notice a bad smell coming from the incision site or bandage.  Your incision breaks open.  You cough up blood or pus, or you develop a cough that produces bad-smelling sputum.  You have pain or swelling in your legs.  You have increasing pain that is not controlled with medicine.  You have trouble managing any of the tubes that have been left in place after surgery.  You have fever or chills. Get help right away if:  You have chest pain or an irregular or rapid  heartbeat.  You have dizzy episodes or faint.  You have shortness of breath or difficulty breathing.  You have persistent nausea or vomiting.  You have a rash. This information is not intended to replace advice given to you by your health care provider. Make sure you discuss any questions you have with your health care provider. Document Released: 12/28/2004 Document Revised: 11/16/2015 Document Reviewed: 07/30/2013 Elsevier Interactive Patient Education  2018 [employer].

## 2017-09-19 NOTE — Progress Notes (Signed)
[address]       [name] [address]             [phone]                    Brandi Crosby [place] Medical Record #[id number] Date of Birth: [birth year]  Referring: Kieth Brightly Primary Care: [name], MD Primary Cardiologist: No primary care provider on file.  Chief Complaint:    Chief Complaint  Patient presents with  . Lung Mass    History of Present Illness:    Brandi Crosby 62 y.o. female is seen in the office  Last week  for evaluation of left lung mass.  The patient has been a smoker for 20 years, most of this was proximally half pack a day.  She is now down to smoking 3 cigarettes a day.  She began having tooth and mouth pain, was seen by endodontist x-rays there suggested sinus infection rather than true dental issue.  She became violently ill with Augmentin treatment with nausea and vomiting.  While being evaluated for the sinus problems she was noted to be wheezing and a chest x-ray was obtained.  This led to a CT scan and referral because of  left lung mass   Since seen last week, PET scan and PFT done    Current Activity/ Functional Status:  Patient is independent with mobility/ambulation, transfers, ADL's, IADL's.   Zubrod Score: At the time of surgery this patient's most appropriate activity status/level should be described as: [x]     0    Normal activity, no symptoms []     1    Restricted in physical strenuous activity but ambulatory, able to do out light work []     2    Ambulatory and capable of self care, unable to do work activities, up and about               >50 % of waking hours                              []     3    Only limited self care, in bed greater than 50% of waking hours []     4    Completely disabled, no self care, confined to bed or chair []     5    Moribund   Past Medical History:  Diagnosis Date  . Allergy   . Anxiety   . Depression   . Hyperlipemia   . Lung mass   . Shingles       Past Surgical History:  Procedure Laterality Date  . DENTAL SURGERY  2010   implant  . RIGHT OOPHORECTOMY    . TONSILLECTOMY AND ADENOIDECTOMY    . WISDOM TOOTH EXTRACTION      Family History  Problem Relation Age of Onset  . Melanoma Mother   . Early death Mother   . Alzheimer's disease Father   . Hyperlipidemia Father   . Heart disease Sister     Social History   Socioeconomic History  . Marital status: Married    Spouse name: Not on file  . Number of children: Not on file  . Years of education: Not on file  . Highest education level: Not on file  Occupational History  .  Works as Forensic psychologist  Tobacco Use  . Smoking status: Current  Every Day Smoker    Packs/day: 0.50    Years: 15.00    Pack years: 7.50    Types: Cigarettes  . Smokeless tobacco: Never Used  Substance and Sexual Activity  . Alcohol use: Yes    Alcohol/week: 1.2 oz    Types: 2 Glasses of wine per week  . Drug use: No  . Sexual activity: Not on file    Social History   Tobacco Use  Smoking Status Current Every Day Smoker  . Packs/day: 0.50  . Years: 15.00  . Pack years: 7.50  . Types: Cigarettes  Smokeless Tobacco Never Used    Social History   Substance and Sexual Activity  Alcohol Use Yes  . Alcohol/week: 1.2 oz  . Types: 2 Glasses of wine per week     Allergies  Allergen Reactions  . Augmentin [Amoxicillin-Pot Clavulanate] Diarrhea and Nausea And Vomiting  . Penicillins Nausea And Vomiting and Other (See Comments)    Has patient had a PCN reaction causing immediate rash, facial/tongue/throat swelling, SOB or lightheadedness with hypotension: Yes Has patient had a PCN reaction causing severe rash involving mucus membranes or skin necrosis: No Has patient had a PCN reaction that required hospitalization: No Has patient had a PCN reaction occurring within the last 10 years: Yes If all of the above answers are "NO", then may proceed with Cephalosporin use.     Current  Outpatient Medications  Medication Sig Dispense Refill  . ALPRAZolam (XANAX) 0.5 MG tablet Take 0.5 mg by mouth at bedtime as needed for anxiety or sleep.     . Ascorbic Acid (VITAMIN C) 1000 MG tablet Take 1,000 mg by mouth daily.      . cholecalciferol (VITAMIN D) 1000 units tablet Take 1,000 Units by mouth daily.    [name] escitalopram (LEXAPRO) 10 MG tablet Take 10 mg by mouth daily.     [name] ibuprofen (ADVIL,MOTRIN) 200 MG tablet Take 400 mg by mouth every 6 (six) hours as needed for headache or moderate pain.      No current facility-administered medications for this visit.     Pertinent items are noted in HPI.   Review of Systems:  Review of Systems  Constitutional: Positive for malaise/fatigue. Negative for diaphoresis, fever and weight loss.  HENT: Positive for congestion and sinus pain. Negative for ear discharge, ear pain, hearing loss, sore throat and tinnitus.   Eyes: Negative.   Respiratory: Positive for cough and wheezing. Negative for hemoptysis, sputum production, shortness of breath and stridor.   Cardiovascular: Negative.   Gastrointestinal: Negative.  Negative for abdominal pain, diarrhea and vomiting.  Genitourinary: Negative.   Musculoskeletal: Negative.   Skin: Negative.   Neurological: Negative.   Endo/Heme/Allergies: Negative.   Psychiatric/Behavioral: Negative.     Immunizations: Flu up to date [ n ]; Pneumococcal up to date [ n ];   Physical Exam: BP 140/84   Pulse 88   Resp 20   Ht 5\' 5"  (1.651 m)   Wt 126 lb (57.2 kg)   SpO2 99% Comment: RA  BMI 20.97 kg/m   PHYSICAL EXAMINATION: General appearance: alert and cooperative Head: Normocephalic, without obvious abnormality, atraumatic Neck: no adenopathy, no carotid bruit, no JVD, supple, symmetrical, trachea midline and thyroid not enlarged, symmetric, no tenderness/mass/nodules Lymph nodes: Cervical, supraclavicular, and axillary nodes normal. Resp: clear to auscultation bilaterally Back: symmetric,  no curvature. ROM normal. No CVA tenderness. Cardio: regular rate and rhythm, S1, S2 normal, no murmur, click, rub or gallop GI: soft,  non-tender; bowel sounds normal; no masses,  no organomegaly Extremities: extremities normal, atraumatic, no cyanosis or edema Neurologic: Grossly normal  Diagnostic Studies & Laboratory data:     Recent Radiology Findings: Nm Pet Image Initial (pi) Skull Base To Thigh  Result Date: 09/18/2017 CLINICAL DATA:  Initial treatment strategy for left lower lobe pulmonary lesion. EXAM: NUCLEAR MEDICINE PET SKULL BASE TO THIGH TECHNIQUE: 6.2 mCi F-18 FDG was injected intravenously. Full-ring PET imaging was performed from the skull base to thigh after the radiotracer. CT data was obtained and used for attenuation correction and anatomic localization. Fasting blood glucose: 99 mg/dl COMPARISON:  CT chest 08/26/2017 FINDINGS: Mediastinal blood pool activity: SUV max 2.02 NECK: There is a 18.5 x 13.5 mm solid lesion in the posterior inferior aspect of the right parotid gland which is hypermetabolic with SUV max of 4.09. 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 No associated neck adenopathy. Recommend ENT consultation/evaluation and consideration for biopsy. Incidental CT findings: none CHEST: The 2.7 cm left lower lobe pulmonary lesion is hypermetabolic with SUV max of 7.35 and consistent with primary lung neoplasm. No enlarged or hypermetabolic mediastinal or hilar lymph nodes. No other pulmonary lesions are identified. Stable underlying emphysematous changes. No breast masses, supraclavicular or axillary adenopathy. Small scattered lymph nodes are noted. Incidental CT findings: None ABDOMEN/PELVIS: No abnormal hypermetabolic activity within the liver, pancreas, adrenal glands, or spleen. No hypermetabolic lymph nodes in the abdomen or pelvis. Incidental CT findings: none SKELETON: No focal hypermetabolic activity to suggest skeletal metastasis. Incidental CT  findings: none IMPRESSION: 1. 2.7 cm left lower lobe pulmonary lesion is hypermetabolic and consistent with primary lung neoplasm. No mediastinal or hilar adenopathy or evidence of metastatic disease. 2. 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 Recommend ENT consultation. Electronically Signed   By: Marijo Sanes M.D.   On: 09/18/2017 09:37    I have independently reviewed the above radiology studies  and reviewed the findings with the patient.    Ct Chest W Contrast  Result Date: 08/26/2017 CLINICAL DATA:  Abnormal chest radiography yesterday.  Smoker. EXAM: CT CHEST WITH CONTRAST TECHNIQUE: Multidetector CT imaging of the chest was performed during intravenous contrast administration. CONTRAST:  59mL ISOVUE-300 IOPAMIDOL (ISOVUE-300) INJECTION 61% COMPARISON:  Chest radiography 08/25/2017 FINDINGS: Cardiovascular: Heart size is normal. There is ordinary aortic atherosclerosis without aneurysm or dissection. Minimal coronary artery calcification evident. Mediastinum/Nodes: No enlarged mediastinal or hilar lymph nodes on either side. Lungs/Pleura: In the right lung, there is a 2 mm calcified granuloma laterally on image 49. There are a few other tiny subpleural nodular shadows which are not significant. In the left lung, there is a 3-3.5 cm mass like density anteriorly in the midportion of the left lower lobe with a broad surface along the major fissure. Branching tubular densities emanate from the inferior margin consistent with obstructed bronchi. Although the differential diagnosis does include a very large example of bronchial atresia, this certainly could represent a malignant pulmonary lesion. There is a small cluster of tree-in-bud pulmonary opacities extending superior to the main lesion, as expected with more proximal bronchial obstruction. Medially in the left lower lobe in the superior segment, image 67, there is a 3 mm opacity which is  nonspecific. Upper Abdomen: Adrenal glands are within normal limits. No significant upper abdominal finding. Musculoskeletal: Curvature in degenerative changes of the spine. No skeletal destructive lesions are seen. IMPRESSION: 3-3.5 cm mass anteriorly within the midportion of the left lower  lobe with a broad surface along the major fissure. Malignancy is the primary concern. No sign of metastatic adenopathy. Multi disciplinary thoracic Oncology referral would be appropriate in this case. Aortic Atherosclerosis (ICD10-I70.0). Electronically Signed   By: Nelson Chimes M.D.   On: 08/26/2017 14:57     I have independently reviewed the above radiology studies  and reviewed the findings with the patient.   Recent Lab Findings: No results found for: WBC, HGB, HCT, PLT, GLUCOSE, CHOL, TRIG, HDL, LDLDIRECT, LDLCALC, ALT, AST, NA, K, CL, CREATININE, BUN, CO2, TSH, INR, GLUF, HGBA1C  PFT's 08/2017 FEV1 1.73 65%  With broncho dilator +16% DLCO 20.79  81% Interpretation: The FVC, FEV1, FEV1/FVC ratio and FEF25-75% are reduced indicating airway obstruction. The increased airway resistance and decreased specific conductance indicate a central airway disease. The SVC is reduced, but the TLC is within normal limits. Following administration of bronchodilators, there is a significant response indicated by the increased FVC. The diffusing capacity is normal. However, the diffusing capacity was not corrected for the patient's hemoglobin. Conclusions: Moderate airway obstruction is present. 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 No mediastinal or hilar adenopathy or evidence of  metastatic disease.    2/   Solid hypermetabolic right parotid gland lesion worrisome for primary parotid gland neoplasm.  3/    Moderate Obstructive Airways Disease by pfts  With a highly suspicious radiographic findings including the PET scan recommended to the patient that we proceed with primary resection with bronchoscopy of her treatment and to obtain a tissue diagnosis.  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 Risks and treatment options have been discussed with patient and her husband in detail, including surgical risk of death infection bleeding blood transfusion, prolonged air leak pulmonary embolus, cardiac complications.  The patient is willing to proceed and had her questions answered .  Grace Isaac MD      [address] [place] [address] Office [phone]   Beeper [id number]  09/22/2017 9:37 PM

## 2017-09-23 ENCOUNTER — Encounter: Payer: 59 | Admitting: Internal Medicine

## 2017-09-24 NOTE — Pre-Procedure Instructions (Signed)
Brandi Crosby  09/24/2017      [id number] - [place], [place] - [address] [place] [address] Phone: [phone] Fax: [phone]    Your procedure is scheduled on Tuesday April 9.  Report to [facility] Admitting at 5:30 A.M.  Call this number if you have problems the morning of surgery:  [phone]   Remember:  Do not eat food or drink liquids after midnight.  Take these medicines the morning of surgery with A SIP OF WATER: Escitalopram (Lexapro)  7 days prior to surgery STOP taking any Aspirin(unless otherwise instructed by your surgeon), Aleve, Naproxen, Ibuprofen, Motrin, Advil, Goody's, BC's, all herbal medications, fish oil, and all vitamins    Do not wear jewelry, make-up or nail polish.  Do not wear lotions, powders, or perfumes, or deodorant.  Do not shave 48 hours prior to surgery.  Men may shave face and neck.  Do not bring valuables to the hospital.  [facility] is not responsible for any belongings or valuables.  Contacts, dentures or bridgework may not be worn into surgery.  Leave your suitcase in the car.  After surgery it may be brought to your room.  For patients admitted to the hospital, discharge time will be determined by your treatment team.  Patients discharged the day of surgery will not be allowed to drive home.   Special instructions:    Brandi Crosby- Preparing For Surgery  Before surgery, you can play an important role. Because skin is not sterile, your skin needs to be as free of germs as possible. You can reduce the number of germs on your skin by washing with CHG (chlorahexidine gluconate) Soap before surgery.  CHG is an antiseptic cleaner which kills germs and bonds with the skin to continue killing germs even after washing.  Please do not use if you have an allergy to CHG or antibacterial soaps. If your skin becomes reddened/irritated stop using the CHG.  Do not shave  (including legs and underarms) for at least 48 hours prior to first CHG shower. It is OK to shave your face.  Please follow these instructions carefully.   1. Shower the NIGHT BEFORE SURGERY and the MORNING OF SURGERY with CHG.   2. If you chose to wash your hair, wash your hair first as usual with your normal shampoo.  3. After you shampoo, rinse your hair and body thoroughly to remove the shampoo.  4. Use CHG as you would any other liquid soap. You can apply CHG directly to the skin and wash gently with a scrungie or a clean washcloth.   5. Apply the CHG Soap to your body ONLY FROM THE NECK DOWN.  Do not use on open wounds or open sores. Avoid contact with your eyes, ears, mouth and genitals (private parts). Wash Face and genitals (private parts)  with your normal soap.  6. Wash thoroughly, paying special attention to the area where your surgery will be performed.  7. Thoroughly rinse your body with warm water from the neck down.  8. DO NOT shower/wash with your normal soap after using and rinsing off the CHG Soap.  9. Pat yourself dry with a CLEAN TOWEL.  10. Wear CLEAN PAJAMAS to bed the night before surgery, wear comfortable clothes the morning of surgery  11. Place CLEAN SHEETS on your bed the night of your first shower and DO NOT SLEEP WITH PETS.  Day of Surgery: Do not apply any deodorants/lotions. Please wear clean clothes to the hospital/surgery center.      Please read over the following fact sheets that you were given. Coughing and Deep Breathing, MRSA Information and Surgical Site Infection Prevention

## 2017-09-25 ENCOUNTER — Other Ambulatory Visit: Payer: Self-pay

## 2017-09-25 ENCOUNTER — Encounter (HOSPITAL_COMMUNITY)
Admission: RE | Admit: 2017-09-25 | Discharge: 2017-09-25 | Disposition: A | Discharge status: Home / Self Care | Payer: 59 | Source: Ambulatory Visit | Attending: Cardiothoracic Surgery | Admitting: Cardiothoracic Surgery

## 2017-09-25 ENCOUNTER — Encounter (HOSPITAL_COMMUNITY): Payer: Self-pay

## 2017-09-25 DIAGNOSIS — Z01812 Encounter for preprocedural laboratory examination: Secondary | ICD-10-CM | POA: Insufficient documentation

## 2017-09-25 DIAGNOSIS — R918 Other nonspecific abnormal finding of lung field: Secondary | ICD-10-CM | POA: Insufficient documentation

## 2017-09-25 DIAGNOSIS — Z01818 Encounter for other preprocedural examination: Secondary | ICD-10-CM | POA: Diagnosis present

## 2017-09-25 DIAGNOSIS — J984 Other disorders of lung: Secondary | ICD-10-CM

## 2017-09-25 DIAGNOSIS — Z0183 Encounter for blood typing: Secondary | ICD-10-CM | POA: Insufficient documentation

## 2017-09-25 LAB — CBC
HCT: 40.1 % (ref 36.0–46.0)
Hemoglobin: 13.5 g/dL (ref 12.0–15.0)
MCH: 32.1 pg (ref 26.0–34.0)
MCHC: 33.7 g/dL (ref 30.0–36.0)
MCV: 95.5 fL (ref 78.0–100.0)
Platelets: 216 10*3/uL (ref 150–400)
RBC: 4.2 MIL/uL (ref 3.87–5.11)
RDW: 12.5 % (ref 11.5–15.5)
WBC: 6.8 10*3/uL (ref 4.0–10.5)

## 2017-09-25 LAB — URINALYSIS, ROUTINE W REFLEX MICROSCOPIC
Bilirubin Urine: NEGATIVE
Glucose, UA: NEGATIVE mg/dL
Hgb urine dipstick: NEGATIVE
Ketones, ur: NEGATIVE mg/dL
Leukocytes, UA: NEGATIVE
Nitrite: NEGATIVE
Protein, ur: NEGATIVE mg/dL
Specific Gravity, Urine: 1.012 (ref 1.005–1.030)
pH: 5 (ref 5.0–8.0)

## 2017-09-25 LAB — COMPREHENSIVE METABOLIC PANEL
ALT: 23 U/L (ref 14–54)
AST: 21 U/L (ref 15–41)
Albumin: 4.4 g/dL (ref 3.5–5.0)
Alkaline Phosphatase: 64 U/L (ref 38–126)
Anion gap: 13 (ref 5–15)
BUN: 12 mg/dL (ref 6–20)
CO2: 19 mmol/L — ABNORMAL LOW (ref 22–32)
Calcium: 9.2 mg/dL (ref 8.9–10.3)
Chloride: 104 mmol/L (ref 101–111)
Creatinine, Ser: 0.67 mg/dL (ref 0.44–1.00)
GFR calc Af Amer: >60 mL/min (ref >60)
GFR calc non Af Amer: >60 mL/min (ref >60)
Glucose, Bld: 97 mg/dL (ref 65–99)
Potassium: 4.4 mmol/L (ref 3.5–5.1)
Sodium: 136 mmol/L (ref 135–145)
Total Bilirubin: 0.8 mg/dL (ref 0.3–1.2)
Total Protein: 6.9 g/dL (ref 6.5–8.1)

## 2017-09-25 LAB — BLOOD GAS, ARTERIAL
Acid-base deficit: 0.1 mmol/L (ref 0.0–2.0)
Bicarbonate: 23.7 mmol/L (ref 20.0–28.0)
Drawn by: 470591
FIO2: 21
O2 Saturation: 97.6 %
Patient temperature: 98.6
pCO2 arterial: 36.2 mmHg (ref 32.0–48.0)
pH, Arterial: 7.432 (ref 7.350–7.450)
pO2, Arterial: 94.1 mmHg (ref 83.0–108.0)

## 2017-09-25 LAB — ABO/RH: ABO/RH(D): O POS

## 2017-09-25 LAB — SURGICAL PCR SCREEN
MRSA, PCR: NEGATIVE
Staphylococcus aureus: NEGATIVE

## 2017-09-25 LAB — TYPE AND SCREEN
ABO/RH(D): O POS
Antibody Screen: NEGATIVE

## 2017-09-25 LAB — PROTIME-INR
INR: 0.98
Prothrombin Time: 12.9 seconds (ref 11.4–15.2)

## 2017-09-25 LAB — APTT: aPTT: 28 seconds (ref 24–36)

## 2017-09-25 NOTE — Progress Notes (Signed)
PCP - Dr. Laurice [facility] Cardiologist - patient denies  Chest x-ray - n/a EKG - 09/25/2017 Stress Test - patient denies ECHO - patient denies Cardiac Cath - patient denies  Sleep Study - patient denies  Anesthesia review: n/a  Patient denies shortness of breath, fever, cough and chest pain at PAT appointment   Patient verbalized understanding of instructions that were given to them at the PAT appointment. Patient was also instructed that they will need to review over the PAT instructions again at home before surgery.

## 2017-09-30 ENCOUNTER — Inpatient Hospital Stay (HOSPITAL_COMMUNITY): Payer: 59

## 2017-09-30 ENCOUNTER — Inpatient Hospital Stay (HOSPITAL_COMMUNITY): Payer: 59 | Admitting: Certified Registered"

## 2017-09-30 ENCOUNTER — Encounter (HOSPITAL_COMMUNITY): Payer: Self-pay

## 2017-09-30 ENCOUNTER — Inpatient Hospital Stay (HOSPITAL_COMMUNITY)
Admission: RE | Admit: 2017-09-30 | Discharge: 2017-10-04 | DRG: 164 | Disposition: A | Discharge status: Home / Self Care | Payer: 59 | Source: Ambulatory Visit | Attending: Cardiothoracic Surgery | Admitting: Cardiothoracic Surgery

## 2017-09-30 ENCOUNTER — Encounter (HOSPITAL_COMMUNITY)
Admission: RE | Disposition: A | Discharge status: Home / Self Care | Payer: Self-pay | Source: Ambulatory Visit | Attending: Cardiothoracic Surgery

## 2017-09-30 DIAGNOSIS — Z808 Family history of malignant neoplasm of other organs or systems: Secondary | ICD-10-CM | POA: Diagnosis not present

## 2017-09-30 DIAGNOSIS — C3432 Malignant neoplasm of lower lobe, left bronchus or lung: Principal | ICD-10-CM | POA: Diagnosis present

## 2017-09-30 DIAGNOSIS — D62 Acute posthemorrhagic anemia: Secondary | ICD-10-CM | POA: Diagnosis not present

## 2017-09-30 DIAGNOSIS — K5900 Constipation, unspecified: Secondary | ICD-10-CM | POA: Diagnosis present

## 2017-09-30 DIAGNOSIS — I10 Essential (primary) hypertension: Secondary | ICD-10-CM | POA: Diagnosis present

## 2017-09-30 DIAGNOSIS — F419 Anxiety disorder, unspecified: Secondary | ICD-10-CM | POA: Diagnosis present

## 2017-09-30 DIAGNOSIS — Z8349 Family history of other endocrine, nutritional and metabolic diseases: Secondary | ICD-10-CM

## 2017-09-30 DIAGNOSIS — Z902 Acquired absence of lung [part of]: Secondary | ICD-10-CM

## 2017-09-30 DIAGNOSIS — R918 Other nonspecific abnormal finding of lung field: Secondary | ICD-10-CM | POA: Diagnosis present

## 2017-09-30 DIAGNOSIS — R222 Localized swelling, mass and lump, trunk: Secondary | ICD-10-CM

## 2017-09-30 DIAGNOSIS — Z90721 Acquired absence of ovaries, unilateral: Secondary | ICD-10-CM

## 2017-09-30 DIAGNOSIS — E785 Hyperlipidemia, unspecified: Secondary | ICD-10-CM | POA: Diagnosis present

## 2017-09-30 DIAGNOSIS — F329 Major depressive disorder, single episode, unspecified: Secondary | ICD-10-CM | POA: Diagnosis present

## 2017-09-30 DIAGNOSIS — Z880 Allergy status to penicillin: Secondary | ICD-10-CM

## 2017-09-30 DIAGNOSIS — Z8249 Family history of ischemic heart disease and other diseases of the circulatory system: Secondary | ICD-10-CM

## 2017-09-30 DIAGNOSIS — F17210 Nicotine dependence, cigarettes, uncomplicated: Secondary | ICD-10-CM | POA: Diagnosis present

## 2017-09-30 DIAGNOSIS — Z881 Allergy status to other antibiotic agents status: Secondary | ICD-10-CM

## 2017-09-30 DIAGNOSIS — Z9689 Presence of other specified functional implants: Secondary | ICD-10-CM

## 2017-09-30 DIAGNOSIS — J984 Other disorders of lung: Secondary | ICD-10-CM

## 2017-09-30 DIAGNOSIS — Z4682 Encounter for fitting and adjustment of non-vascular catheter: Secondary | ICD-10-CM

## 2017-09-30 DIAGNOSIS — J939 Pneumothorax, unspecified: Secondary | ICD-10-CM

## 2017-09-30 HISTORY — PX: VIDEO BRONCHOSCOPY: SHX5072

## 2017-09-30 HISTORY — PX: NODE DISSECTION: SHX5269

## 2017-09-30 HISTORY — PX: VIDEO ASSISTED THORACOSCOPY (VATS)/WEDGE RESECTION: SHX6174

## 2017-09-30 SURGERY — BRONCHOSCOPY, VIDEO-ASSISTED
Anesthesia: General | Site: Chest

## 2017-09-30 MED ORDER — OXYCODONE HCL 5 MG PO TABS: 5.0000 mg | ORAL_TABLET | Freq: Once | ORAL | Status: DC | PRN

## 2017-09-30 MED ORDER — EPINEPHRINE PF 1 MG/ML IJ SOLN
INTRAMUSCULAR | Status: DC | PRN
  Administered 2017-09-30: 1 mg

## 2017-09-30 MED ORDER — ONDANSETRON HCL 4 MG/2ML IJ SOLN: 4.0000 mg | Freq: Four times a day (QID) | INTRAMUSCULAR | Status: DC | PRN

## 2017-09-30 MED ORDER — LACTATED RINGERS IV SOLN
INTRAVENOUS | Status: DC
  Administered 2017-09-30 (×2): via INTRAVENOUS

## 2017-09-30 MED ORDER — PROPOFOL 10 MG/ML IV BOLUS
INTRAVENOUS | Status: DC | PRN
  Administered 2017-09-30: 50 mg via INTRAVENOUS
  Administered 2017-09-30: 150 mg via INTRAVENOUS
  Administered 2017-09-30: 50 mg via INTRAVENOUS

## 2017-09-30 MED ORDER — SUGAMMADEX SODIUM 200 MG/2ML IV SOLN
INTRAVENOUS | Status: DC | PRN
  Administered 2017-09-30: 120 mg via INTRAVENOUS

## 2017-09-30 MED ORDER — PROPOFOL 10 MG/ML IV BOLUS
INTRAVENOUS | Status: AC
  Filled 2017-09-30: qty 20

## 2017-09-30 MED ORDER — GLYCOPYRROLATE 0.2 MG/ML IJ SOLN
INTRAMUSCULAR | Status: DC | PRN
  Administered 2017-09-30: 0.2 mg via INTRAVENOUS

## 2017-09-30 MED ORDER — ALPRAZOLAM 0.5 MG PO TABS
0.5000 mg | ORAL_TABLET | Freq: Every evening | ORAL | Status: DC | PRN
Start: 2017-09-30 — End: 2017-10-04
  Administered 2017-10-02 – 2017-10-03 (×2): 0.5 mg via ORAL
  Filled 2017-09-30 (×2): qty 1

## 2017-09-30 MED ORDER — EPINEPHRINE PF 1 MG/ML IJ SOLN
INTRAMUSCULAR | Status: AC
  Filled 2017-09-30: qty 2

## 2017-09-30 MED ORDER — HEMOSTATIC AGENTS (NO CHARGE) OPTIME
TOPICAL | Status: DC | PRN
  Administered 2017-09-30: 1 via TOPICAL

## 2017-09-30 MED ORDER — ESCITALOPRAM OXALATE 10 MG PO TABS
10.0000 mg | ORAL_TABLET | Freq: Every day | ORAL | Status: DC
  Administered 2017-10-01 – 2017-10-04 (×4): 10 mg via ORAL
  Filled 2017-09-30 (×4): qty 1

## 2017-09-30 MED ORDER — FENTANYL CITRATE (PF) 250 MCG/5ML IJ SOLN
INTRAMUSCULAR | Status: AC
  Filled 2017-09-30: qty 5

## 2017-09-30 MED ORDER — ROCURONIUM BROMIDE 100 MG/10ML IV SOLN
INTRAVENOUS | Status: DC | PRN
  Administered 2017-09-30 (×2): 10 mg via INTRAVENOUS
  Administered 2017-09-30: 50 mg via INTRAVENOUS
  Administered 2017-09-30 (×2): 30 mg via INTRAVENOUS

## 2017-09-30 MED ORDER — 0.9 % SODIUM CHLORIDE (POUR BTL) OPTIME
TOPICAL | Status: DC | PRN
  Administered 2017-09-30: 1000 mL
  Administered 2017-09-30: 3000 mL

## 2017-09-30 MED ORDER — SODIUM CHLORIDE 0.9% FLUSH: 9.0000 mL | INTRAVENOUS | Status: DC | PRN

## 2017-09-30 MED ORDER — BUPIVACAINE 0.5 % ON-Q PUMP SINGLE CATH 400 ML
400.0000 mL | INJECTION | Status: DC
  Filled 2017-09-30: qty 400

## 2017-09-30 MED ORDER — FENTANYL 40 MCG/ML IV SOLN
INTRAVENOUS | Status: DC
  Administered 2017-09-30: 14:00:00 via INTRAVENOUS
  Administered 2017-09-30: 50 ug via INTRAVENOUS
  Administered 2017-09-30: 80 ug via INTRAVENOUS
  Administered 2017-10-01 (×2): 20 ug via INTRAVENOUS
  Administered 2017-10-01: 80 ug via INTRAVENOUS
  Administered 2017-10-01: 40 ug via INTRAVENOUS
  Administered 2017-10-01 – 2017-10-02 (×3): 10 ug via INTRAVENOUS
  Filled 2017-09-30: qty 25

## 2017-09-30 MED ORDER — NALOXONE HCL 0.4 MG/ML IJ SOLN: 0.4000 mg | INTRAMUSCULAR | Status: DC | PRN

## 2017-09-30 MED ORDER — BUPIVACAINE HCL (PF) 0.5 % IJ SOLN
INTRAMUSCULAR | Status: AC
  Filled 2017-09-30: qty 10

## 2017-09-30 MED ORDER — VANCOMYCIN HCL IN DEXTROSE 1-5 GM/200ML-% IV SOLN
1000.0000 mg | Freq: Two times a day (BID) | INTRAVENOUS | Status: AC
  Administered 2017-09-30: 1000 mg via INTRAVENOUS
  Filled 2017-09-30: qty 200

## 2017-09-30 MED ORDER — DIPHENHYDRAMINE HCL 12.5 MG/5ML PO ELIX
12.5000 mg | ORAL_SOLUTION | Freq: Four times a day (QID) | ORAL | Status: DC | PRN
  Filled 2017-09-30: qty 5

## 2017-09-30 MED ORDER — LACTATED RINGERS IV SOLN
INTRAVENOUS | Status: DC | PRN
  Administered 2017-09-30: 07:00:00 via INTRAVENOUS

## 2017-09-30 MED ORDER — BISACODYL 5 MG PO TBEC
10.0000 mg | DELAYED_RELEASE_TABLET | Freq: Every day | ORAL | Status: DC
  Administered 2017-10-01 – 2017-10-03 (×2): 10 mg via ORAL
  Filled 2017-09-30 (×4): qty 2

## 2017-09-30 MED ORDER — DIPHENHYDRAMINE HCL 50 MG/ML IJ SOLN: 12.5000 mg | Freq: Four times a day (QID) | INTRAMUSCULAR | Status: DC | PRN

## 2017-09-30 MED ORDER — BUPIVACAINE HCL 0.5 % IJ SOLN
INTRAMUSCULAR | Status: DC | PRN
  Administered 2017-09-30: 5 mL

## 2017-09-30 MED ORDER — EPINEPHRINE PF 1 MG/ML IJ SOLN
INTRAMUSCULAR | Status: AC
  Filled 2017-09-30: qty 1

## 2017-09-30 MED ORDER — ACETAMINOPHEN 500 MG PO TABS
1000.0000 mg | ORAL_TABLET | Freq: Four times a day (QID) | ORAL | Status: DC
  Administered 2017-09-30 – 2017-10-04 (×13): 1000 mg via ORAL
  Filled 2017-09-30 (×13): qty 2

## 2017-09-30 MED ORDER — DEXTROSE-NACL 5-0.45 % IV SOLN
INTRAVENOUS | Status: DC
  Administered 2017-09-30 – 2017-10-01 (×3): via INTRAVENOUS

## 2017-09-30 MED ORDER — ONDANSETRON HCL 4 MG/2ML IJ SOLN
INTRAMUSCULAR | Status: DC | PRN
  Administered 2017-09-30: 4 mg via INTRAVENOUS

## 2017-09-30 MED ORDER — SENNOSIDES-DOCUSATE SODIUM 8.6-50 MG PO TABS
1.0000 | ORAL_TABLET | Freq: Every day | ORAL | Status: DC
  Administered 2017-10-01 – 2017-10-02 (×2): 1 via ORAL
  Filled 2017-09-30 (×4): qty 1

## 2017-09-30 MED ORDER — OXYCODONE HCL 5 MG/5ML PO SOLN: 5.0000 mg | Freq: Once | ORAL | Status: DC | PRN

## 2017-09-30 MED ORDER — HEMOSTATIC AGENTS (NO CHARGE) OPTIME
TOPICAL | Status: DC | PRN
  Administered 2017-09-30 (×2): 1 via TOPICAL

## 2017-09-30 MED ORDER — LEVALBUTEROL HCL 0.63 MG/3ML IN NEBU
0.6300 mg | INHALATION_SOLUTION | Freq: Three times a day (TID) | RESPIRATORY_TRACT | Status: DC
  Administered 2017-10-01 – 2017-10-03 (×5): 0.63 mg via RESPIRATORY_TRACT
  Filled 2017-09-30 (×9): qty 3

## 2017-09-30 MED ORDER — FENTANYL CITRATE (PF) 100 MCG/2ML IJ SOLN
INTRAMUSCULAR | Status: DC | PRN
  Administered 2017-09-30 (×2): 25 ug via INTRAVENOUS
  Administered 2017-09-30 (×5): 50 ug via INTRAVENOUS
  Administered 2017-09-30: 25 ug via INTRAVENOUS
  Administered 2017-09-30: 50 ug via INTRAVENOUS
  Administered 2017-09-30: 25 ug via INTRAVENOUS

## 2017-09-30 MED ORDER — LEVALBUTEROL HCL 0.63 MG/3ML IN NEBU
0.6300 mg | INHALATION_SOLUTION | Freq: Four times a day (QID) | RESPIRATORY_TRACT | Status: DC
  Administered 2017-09-30 (×2): 0.63 mg via RESPIRATORY_TRACT
  Filled 2017-09-30 (×2): qty 3

## 2017-09-30 MED ORDER — TRAMADOL HCL 50 MG PO TABS
50.0000 mg | ORAL_TABLET | Freq: Four times a day (QID) | ORAL | Status: DC | PRN
  Administered 2017-10-03: 50 mg via ORAL
  Filled 2017-09-30: qty 1

## 2017-09-30 MED ORDER — MIDAZOLAM HCL 5 MG/5ML IJ SOLN
INTRAMUSCULAR | Status: DC | PRN
  Administered 2017-09-30: .5 mg via INTRAVENOUS
  Administered 2017-09-30: 1 mg via INTRAVENOUS
  Administered 2017-09-30: 0.5 mg via INTRAVENOUS

## 2017-09-30 MED ORDER — ACETAMINOPHEN 160 MG/5ML PO SOLN: 1000.0000 mg | Freq: Four times a day (QID) | ORAL | Status: DC

## 2017-09-30 MED ORDER — VANCOMYCIN HCL IN DEXTROSE 1-5 GM/200ML-% IV SOLN
1000.0000 mg | INTRAVENOUS | Status: AC
  Administered 2017-09-30: 1000 mg via INTRAVENOUS
  Filled 2017-09-30: qty 200

## 2017-09-30 MED ORDER — DEXAMETHASONE SODIUM PHOSPHATE 10 MG/ML IJ SOLN
INTRAMUSCULAR | Status: DC | PRN
  Administered 2017-09-30: 8 mg via INTRAVENOUS

## 2017-09-30 MED ORDER — POTASSIUM CHLORIDE 10 MEQ/50ML IV SOLN: 10.0000 meq | Freq: Every day | INTRAVENOUS | Status: DC | PRN

## 2017-09-30 MED ORDER — OXYCODONE HCL 5 MG PO TABS
5.0000 mg | ORAL_TABLET | ORAL | Status: DC | PRN
  Administered 2017-10-03 (×2): 5 mg via ORAL
  Filled 2017-09-30 (×2): qty 1

## 2017-09-30 MED ORDER — METOCLOPRAMIDE HCL 5 MG/ML IJ SOLN
10.0000 mg | Freq: Four times a day (QID) | INTRAMUSCULAR | Status: AC
  Administered 2017-09-30 – 2017-10-01 (×4): 10 mg via INTRAVENOUS
  Filled 2017-09-30 (×4): qty 2

## 2017-09-30 MED ORDER — HYDROMORPHONE HCL 1 MG/ML IJ SOLN: 0.2500 mg | INTRAMUSCULAR | Status: DC | PRN

## 2017-09-30 MED ORDER — MIDAZOLAM HCL 2 MG/2ML IJ SOLN
INTRAMUSCULAR | Status: AC
  Filled 2017-09-30: qty 2

## 2017-09-30 SURGICAL SUPPLY — 104 items
ADH SKN CLS APL DERMABOND .7 (GAUZE/BANDAGES/DRESSINGS) ×2
APL SRG 22X2 LUM MLBL SLNT (VASCULAR PRODUCTS)
APL SRG 7X2 LUM MLBL SLNT (VASCULAR PRODUCTS)
APPLICATOR TIP COSEAL (VASCULAR PRODUCTS) IMPLANT
APPLICATOR TIP EXT COSEAL (VASCULAR PRODUCTS) IMPLANT
BLADE SURG 11 STRL SS (BLADE) ×3 IMPLANT
BRUSH CYTOL CELLEBRITY 1.5X140 (MISCELLANEOUS) IMPLANT
BRUSH CYTOL CELLEBRITY 1.9X150 (MISCELLANEOUS) ×3 IMPLANT
CANISTER SUCT 3000ML PPV (MISCELLANEOUS) ×3 IMPLANT
CAP TITANIUM CLICK LOCK 3D (MISCELLANEOUS) ×3 IMPLANT
CATH KIT ON Q 5IN SLV (PAIN MANAGEMENT) ×3 IMPLANT
CATH MALECOT BARD  28FR (CATHETERS) ×3 IMPLANT
CATH THORACIC 28FR (CATHETERS) ×3 IMPLANT
CATH THORACIC 36FR (CATHETERS) IMPLANT
CATH THORACIC 36FR RT ANG (CATHETERS) IMPLANT
CLIP VESOCCLUDE MED 6/CT (CLIP) ×3 IMPLANT
CONN ST 1/4X3/8  BEN (MISCELLANEOUS) ×2
CONN ST 1/4X3/8 BEN (MISCELLANEOUS) ×4 IMPLANT
CONT SPEC 4OZ CLIKSEAL STRL BL (MISCELLANEOUS) ×48 IMPLANT
COVER BACK TABLE 60X90IN (DRAPES) ×3 IMPLANT
COVER SURGICAL LIGHT HANDLE (MISCELLANEOUS) ×3 IMPLANT
DECANTER SPIKE VIAL GLASS SM (MISCELLANEOUS) ×3 IMPLANT
DERMABOND ADVANCED (GAUZE/BANDAGES/DRESSINGS) ×1
DERMABOND ADVANCED .7 DNX12 (GAUZE/BANDAGES/DRESSINGS) ×2 IMPLANT
DRAIN CHANNEL 28F RND 3/8 FF (WOUND CARE) IMPLANT
DRAIN CHANNEL 32F RND 10.7 FF (WOUND CARE) IMPLANT
DRAPE LAPAROSCOPIC ABDOMINAL (DRAPES) ×3 IMPLANT
DRAPE WARM FLUID 44X44 (DRAPE) ×3 IMPLANT
DRILL BIT 7/64X5 (BIT) ×3 IMPLANT
ELECT BLADE 4.0 EZ CLEAN MEGAD (MISCELLANEOUS) ×3
ELECT BLADE 6.5 EXT (BLADE) ×3 IMPLANT
ELECT REM PT RETURN 9FT ADLT (ELECTROSURGICAL) ×3
ELECTRODE BLDE 4.0 EZ CLN MEGD (MISCELLANEOUS) ×2 IMPLANT
ELECTRODE REM PT RTRN 9FT ADLT (ELECTROSURGICAL) ×2 IMPLANT
FILTER STRAW FLUID ASPIR (MISCELLANEOUS) ×3 IMPLANT
FORCEPS BIOP RJ4 1.8 (CUTTING FORCEPS) IMPLANT
GAUZE SPONGE 4X4 12PLY STRL (GAUZE/BANDAGES/DRESSINGS) ×3 IMPLANT
GAUZE SPONGE 4X4 12PLY STRL LF (GAUZE/BANDAGES/DRESSINGS) ×3 IMPLANT
GLOVE BIO SURGEON STRL SZ 6.5 (GLOVE) ×18 IMPLANT
GLOVE BIOGEL PI IND STRL 6 (GLOVE) ×6 IMPLANT
GLOVE BIOGEL PI INDICATOR 6 (GLOVE) ×3
GOWN STRL REUS W/ TWL LRG LVL3 (GOWN DISPOSABLE) ×14 IMPLANT
GOWN STRL REUS W/TWL LRG LVL3 (GOWN DISPOSABLE) ×21
HEMOSTAT SURGICEL 2X14 (HEMOSTASIS) ×3 IMPLANT
KIT BASIN OR (CUSTOM PROCEDURE TRAY) ×3 IMPLANT
KIT CLEAN ENDO COMPLIANCE (KITS) ×3 IMPLANT
KIT SUCTION CATH 14FR (SUCTIONS) ×3 IMPLANT
KIT TURNOVER KIT B (KITS) ×3 IMPLANT
MARKER SKIN DUAL TIP RULER LAB (MISCELLANEOUS) ×3 IMPLANT
NS IRRIG 1000ML POUR BTL (IV SOLUTION) ×6 IMPLANT
OIL SILICONE PENTAX (PARTS (SERVICE/REPAIRS)) ×6 IMPLANT
PACK CHEST (CUSTOM PROCEDURE TRAY) ×3 IMPLANT
PAD ARMBOARD 7.5X6 YLW CONV (MISCELLANEOUS) ×9 IMPLANT
PASSER SUT SWANSON 36MM LOOP (INSTRUMENTS) ×3 IMPLANT
POUCH ENDO CATCH II 15MM (MISCELLANEOUS) ×6 IMPLANT
RELOAD STAPLER LINE PROX 30 GR (STAPLE) ×2 IMPLANT
SCISSORS LAP 5X35 DISP (ENDOMECHANICALS) IMPLANT
SEALANT PROGEL (MISCELLANEOUS) ×3 IMPLANT
SEALANT SURG COSEAL 4ML (VASCULAR PRODUCTS) IMPLANT
SEALANT SURG COSEAL 8ML (VASCULAR PRODUCTS) IMPLANT
SOLUTION ANTI FOG 6CC (MISCELLANEOUS) ×6 IMPLANT
STAPLE RELOAD 2.5MM WHITE (STAPLE) ×9 IMPLANT
STAPLE RELOAD 45 GRN (STAPLE) ×6 IMPLANT
STAPLE RELOAD 45MM GOLD (STAPLE) ×3 IMPLANT
STAPLE RELOAD 45MM GREEN (STAPLE) ×9
STAPLER ECHELON POWERED (MISCELLANEOUS) ×3 IMPLANT
STAPLER RELOAD LINE PROX 30 GR (STAPLE) ×3
STAPLER VASCULAR ECHELON 35 (CUTTER) ×3 IMPLANT
SUT PROLENE 3 0 SH DA (SUTURE) IMPLANT
SUT PROLENE 4 0 RB 1 (SUTURE)
SUT PROLENE 4-0 RB1 .5 CRCL 36 (SUTURE) IMPLANT
SUT SILK  1 MH (SUTURE) ×9
SUT SILK 1 MH (SUTURE) ×18 IMPLANT
SUT SILK 1 TIES 10X30 (SUTURE) IMPLANT
SUT SILK 2 0 SH (SUTURE) IMPLANT
SUT SILK 2 0SH CR/8 30 (SUTURE) ×9 IMPLANT
SUT SILK 3 0SH CR/8 30 (SUTURE) IMPLANT
SUT STEEL 1 (SUTURE) IMPLANT
SUT VIC AB 0 CTX 18 (SUTURE) ×3 IMPLANT
SUT VIC AB 1 CTX 18 (SUTURE) ×3 IMPLANT
SUT VIC AB 1 CTX 36 (SUTURE) ×3
SUT VIC AB 1 CTX36XBRD ANBCTR (SUTURE) ×2 IMPLANT
SUT VIC AB 2-0 CTX 36 (SUTURE) ×3 IMPLANT
SUT VIC AB 2-0 UR6 27 (SUTURE) IMPLANT
SUT VIC AB 3-0 SH 8-18 (SUTURE) IMPLANT
SUT VIC AB 3-0 X1 27 (SUTURE) ×3 IMPLANT
SUT VICRYL 0 UR6 27IN ABS (SUTURE) IMPLANT
SUT VICRYL 2 TP 1 (SUTURE) ×3 IMPLANT
SYR 20ML ECCENTRIC (SYRINGE) ×9 IMPLANT
SYRINGE 3CC LL L/F (MISCELLANEOUS) ×3 IMPLANT
SYSTEM SAHARA CHEST DRAIN ATS (WOUND CARE) ×3 IMPLANT
TAPE CLOTH SURG 6X10 WHT LF (GAUZE/BANDAGES/DRESSINGS) ×3 IMPLANT
TAPE UMBILICAL COTTON 1/8X30 (MISCELLANEOUS) IMPLANT
TOWEL GREEN STERILE (TOWEL DISPOSABLE) ×3 IMPLANT
TOWEL GREEN STERILE FF (TOWEL DISPOSABLE) ×3 IMPLANT
TRAP SPECIMEN MUCOUS 40CC (MISCELLANEOUS) ×3 IMPLANT
TRAY FOLEY W/METER SILVER 16FR (SET/KITS/TRAYS/PACK) ×3 IMPLANT
TROCAR BLADELESS 12MM (ENDOMECHANICALS) IMPLANT
TROCAR XCEL 12X100 BLDLESS (ENDOMECHANICALS) IMPLANT
TROCAR XCEL NON-BLD 5MMX100MML (ENDOMECHANICALS) ×3 IMPLANT
TUBE CONNECTING 20X1/4 (TUBING) ×3 IMPLANT
TUNNELER SHEATH ON-Q 11GX8 DSP (PAIN MANAGEMENT) ×3 IMPLANT
VALVE DISPOSABLE (MISCELLANEOUS) ×3 IMPLANT
WATER STERILE IRR 1000ML POUR (IV SOLUTION) ×6 IMPLANT

## 2017-09-30 NOTE — H&P (Signed)
[address]       [place] [address]             [phone]                    Brandi Crosby [facility] Medical Record #[id number] Date of Birth: 26-Jan-1956   Referring: Kieth Brightly Primary Care: [name], MD Primary Cardiologist: No primary care provider on file.  Chief Complaint:       Chief Complaint  Patient presents with  . Lung Mass    History of Present Illness:    Brandi Crosby 62 y.o. female was  seen in the office  Last week  for evaluation of left lung mass.  The patient has been a smoker for 20 years, most of this was proximally half pack a day.  She is now down to smoking 3 cigarettes a day.  She began having tooth and mouth pain, was seen by endodontist x-rays there suggested sinus infection rather than true dental issue.  She became violently ill with Augmentin treatment with nausea and vomiting.  While being evaluated for the sinus problems she was noted to be wheezing and a chest x-ray was obtained.  This led to a CT scan and referral because of  left lung mass   Since seen last week, PET scan and PFT done    Current Activity/ Functional Status:  Patient is independent with mobility/ambulation, transfers, ADL's, IADL's.   Zubrod Score: At the time of surgery this patient's most appropriate activity status/level should be described as: [x]     0    Normal activity, no symptoms []     1    Restricted in physical strenuous activity but ambulatory, able to do out light work []     2    Ambulatory and capable of self care, unable to do work activities, up and about               >50 % of waking hours                              []     3    Only limited self care, in bed greater than 50% of waking hours []     4    Completely disabled, no self care, confined to bed or chair []     5    Moribund   Past Medical History:  Diagnosis Date  . Allergy   . Anxiety   . Depression   . Hyperlipemia   . Lung mass   .  Shingles     Past Surgical History:  Procedure Laterality Date  . DENTAL SURGERY  2010   implant  . RIGHT OOPHORECTOMY    . TONSILLECTOMY AND ADENOIDECTOMY    . WISDOM TOOTH EXTRACTION      Family History  Problem Relation Age of Onset  . Melanoma Mother   . Early death Mother   . Alzheimer's disease Father   . Hyperlipidemia Father   . Heart disease Sister     Social History   Socioeconomic History  . Marital status: Married    Spouse name: Not on file  . Number of children: Not on file  . Years of education: Not on file  . Highest education level: Not on file  Occupational History  .  Works as Forensic psychologist  Tobacco Use  .  Smoking status: Current Every Day Smoker    Packs/day: 0.50    Years: 15.00    Pack years: 7.50    Types: Cigarettes  . Smokeless tobacco: Never Used  Substance and Sexual Activity  . Alcohol use: Yes    Alcohol/week: 1.2 oz    Types: 2 Glasses of wine per week  . Drug use: No  . Sexual activity: Not on file    Social History   Tobacco Use  Smoking Status Current Every Day Smoker  . Packs/day: 0.25  . Years: 15.00  . Pack years: 3.75  . Types: Cigarettes  Smokeless Tobacco Never Used    Social History   Substance and Sexual Activity  Alcohol Use Yes  . Alcohol/week: 8.4 oz  . Types: 14 Glasses of wine per week     Allergies  Allergen Reactions  . Augmentin [Amoxicillin-Pot Clavulanate] Diarrhea and Nausea And Vomiting  . Penicillins Nausea And Vomiting and Other (See Comments)    Has patient had a PCN reaction causing immediate rash, facial/tongue/throat swelling, SOB or lightheadedness with hypotension: Yes Has patient had a PCN reaction causing severe rash involving mucus membranes or skin necrosis: No Has patient had a PCN reaction that required hospitalization: No Has patient had a PCN reaction occurring within the last 10 years: Yes If all of the above answers are "NO", then may proceed with Cephalosporin use.      Current Facility-Administered Medications  Medication Dose Route Frequency Provider Last Rate Last Dose  . lactated ringers infusion   Intravenous Continuous Rica Koyanagi, MD 10 mL/hr at 09/30/17 [phone]    . vancomycin (VANCOCIN) IVPB 1000 mg/200 mL premix  1,000 mg Intravenous On Call to OR Grace Isaac, MD 200 mL/hr at 09/30/17 0621 1,000 mg at 09/30/17 9604   Facility-Administered Medications Ordered in Other Encounters  Medication Dose Route Frequency Provider Last Rate Last Dose  . fentaNYL (SUBLIMAZE) injection    Anesthesia Intra-op Josie Dixon, CRNA   25 mcg at 09/30/17 0659  . midazolam (VERSED) 5 MG/5ML injection    Anesthesia Intra-op Josie Dixon, CRNA   0.5 mg at 09/30/17 0658    Pertinent items are noted in HPI.   Review of Systems:  Review of Systems  Constitutional: Positive for malaise/fatigue. Negative for diaphoresis, fever and weight loss.  HENT: Positive for congestion and sinus pain. Negative for ear discharge, ear pain, hearing loss, sore throat and tinnitus.   Eyes: Negative.   Respiratory: Positive for cough and wheezing. Negative for hemoptysis, sputum production, shortness of breath and stridor.   Cardiovascular: Negative.   Gastrointestinal: Negative.  Negative for abdominal pain, diarrhea and vomiting.  Genitourinary: Negative.   Musculoskeletal: Negative.   Skin: Negative.   Neurological: Negative.   Endo/Heme/Allergies: Negative.   Psychiatric/Behavioral: Negative.     Immunizations: Flu up to date [ n ]; Pneumococcal up to date [ n ];   Physical Exam: BP 135/61   Pulse 64   Temp 97.9 F (36.6 C) (Oral)   Resp 20   Ht 5\' 5"  (1.651 m)   Wt 126 lb (57.2 kg)   SpO2 99%   BMI 20.97 kg/m   PHYSICAL EXAMINATION: General appearance: alert and cooperative Head: Normocephalic, without obvious abnormality, atraumatic Neck: no adenopathy, no carotid bruit, no JVD, supple, symmetrical, trachea midline and thyroid not enlarged, symmetric,  no tenderness/mass/nodules Lymph nodes: Cervical, supraclavicular, and axillary nodes normal. Resp: clear to auscultation bilaterally Back: symmetric, no curvature. ROM normal. No CVA tenderness. Cardio:  regular rate and rhythm, S1, S2 normal, no murmur, click, rub or gallop GI: soft, non-tender; bowel sounds normal; no masses,  no organomegaly Extremities: extremities normal, atraumatic, no cyanosis or edema Neurologic: Grossly normal  Diagnostic Studies & Laboratory data:     Recent Radiology Findings: Nm Pet Image Initial (pi) Skull Base To Thigh  Result Date: 09/18/2017 CLINICAL DATA:  Initial treatment strategy for left lower lobe pulmonary lesion. EXAM: NUCLEAR MEDICINE PET SKULL BASE TO THIGH TECHNIQUE: 6.2 mCi F-18 FDG was injected intravenously. Full-ring PET imaging was performed from the skull base to thigh after the radiotracer. CT data was obtained and used for attenuation correction and anatomic localization. Fasting blood glucose: 99 mg/dl COMPARISON:  CT chest 08/26/2017 FINDINGS: Mediastinal blood pool activity: SUV max 2.02 NECK: There is a 18.5 x 13.5 mm solid lesion in the posterior inferior aspect of the right parotid gland which is hypermetabolic with SUV max of 1.61. 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 No associated neck adenopathy. Recommend ENT consultation/evaluation and consideration for biopsy. Incidental CT findings: none CHEST: The 2.7 cm left lower lobe pulmonary lesion is hypermetabolic with SUV max of 0.96 and consistent with primary lung neoplasm. No enlarged or hypermetabolic mediastinal or hilar lymph nodes. No other pulmonary lesions are identified. Stable underlying emphysematous changes. No breast masses, supraclavicular or axillary adenopathy. Small scattered lymph nodes are noted. Incidental CT findings: None ABDOMEN/PELVIS: No abnormal hypermetabolic activity within the liver, pancreas, adrenal glands, or spleen. No hypermetabolic lymph  nodes in the abdomen or pelvis. Incidental CT findings: none SKELETON: No focal hypermetabolic activity to suggest skeletal metastasis. Incidental CT findings: none IMPRESSION: 1. 2.7 cm left lower lobe pulmonary lesion is hypermetabolic and consistent with primary lung neoplasm. No mediastinal or hilar adenopathy or evidence of metastatic disease. 2. 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 Recommend ENT consultation. Electronically Signed   By: Marijo Sanes M.D.   On: 09/18/2017 09:37    I have independently reviewed the above radiology studies  and reviewed the findings with the patient.    Ct Chest W Contrast  Result Date: 08/26/2017 CLINICAL DATA:  Abnormal chest radiography yesterday.  Smoker. EXAM: CT CHEST WITH CONTRAST TECHNIQUE: Multidetector CT imaging of the chest was performed during intravenous contrast administration. CONTRAST:  49mL ISOVUE-300 IOPAMIDOL (ISOVUE-300) INJECTION 61% COMPARISON:  Chest radiography 08/25/2017 FINDINGS: Cardiovascular: Heart size is normal. There is ordinary aortic atherosclerosis without aneurysm or dissection. Minimal coronary artery calcification evident. Mediastinum/Nodes: No enlarged mediastinal or hilar lymph nodes on either side. Lungs/Pleura: In the right lung, there is a 2 mm calcified granuloma laterally on image 49. There are a few other tiny subpleural nodular shadows which are not significant. In the left lung, there is a 3-3.5 cm mass like density anteriorly in the midportion of the left lower lobe with a broad surface along the major fissure. Branching tubular densities emanate from the inferior margin consistent with obstructed bronchi. Although the differential diagnosis does include a very large example of bronchial atresia, this certainly could represent a malignant pulmonary lesion. There is a small cluster of tree-in-bud pulmonary opacities extending superior to the main lesion, as expected  with more proximal bronchial obstruction. Medially in the left lower lobe in the superior segment, image 67, there is a 3 mm opacity which is nonspecific. Upper Abdomen: Adrenal glands are within normal limits. No significant upper abdominal finding. Musculoskeletal: Curvature in degenerative changes of the spine. No skeletal destructive  lesions are seen. IMPRESSION: 3-3.5 cm mass anteriorly within the midportion of the left lower lobe with a broad surface along the major fissure. Malignancy is the primary concern. No sign of metastatic adenopathy. Multi disciplinary thoracic Oncology referral would be appropriate in this case. Aortic Atherosclerosis (ICD10-I70.0). Electronically Signed   By: Nelson Chimes M.D.   On: 08/26/2017 14:57     I have independently reviewed the above radiology studies  and reviewed the findings with the patient.   Recent Lab Findings: Lab Results  Component Value Date   WBC 6.8 09/25/2017   HGB 13.5 09/25/2017   HCT 40.1 09/25/2017   PLT 216 09/25/2017   GLUCOSE 97 09/25/2017   ALT 23 09/25/2017   AST 21 09/25/2017   NA 136 09/25/2017   K 4.4 09/25/2017   CL 104 09/25/2017   CREATININE 0.67 09/25/2017   BUN 12 09/25/2017   CO2 19 (L) 09/25/2017   INR 0.98 09/25/2017    PFT's 08/2017 FEV1 1.73 65%  With broncho dilator +16% DLCO 20.79  81% Interpretation: The FVC, FEV1, FEV1/FVC ratio and FEF25-75% are reduced indicating airway obstruction. The increased airway resistance and decreased specific conductance indicate a central airway disease. The SVC is reduced, but the TLC is within normal limits. Following administration of bronchodilators, there is a significant response indicated by the increased FVC. The diffusing capacity is normal. However, the diffusing capacity was not corrected for the patient's hemoglobin. Conclusions: Moderate airway obstruction is present. 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 No mediastinal or hilar adenopathy or evidence of metastatic disease.    2/   Solid hypermetabolic right parotid gland lesion worrisome for primary parotid gland neoplasm.  3/    Moderate Obstructive Airways Disease by pfts  With a highly suspicious radiographic findings including the PET scan I have  recommended to the patient that we proceed with primary resection with bronchoscopy of her treatment and to obtain a tissue diagnosis.  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 The risks of the procedure including death, infection, persistent air leak stroke, myocardial infarction, bleeding, blood transfusion have all been discussed specifically.  I have quoted Pattricia Boss a 2 % of perioperative mortality and a complication rate as high as 30 %. The patient's questions have been answered.AKSHITA ITALIANO is willing  to proceed with the planned procedure.  Grace Isaac MD      [address] [place] [address] Office [phone]   Beeper [phone]  09/30/2017 7:17 AM

## 2017-09-30 NOTE — Progress Notes (Signed)
[facility] BRIEF SICU PROGRESS NOTE  Day of Surgery  S/P Procedure(s) (LRB): VIDEO BRONCHOSCOPY (N/A) VIDEO ASSISTED THORACOSCOPY (VATS)/LUNG RESECTION (Left) NODE DISSECTION (Left)   No complaints Minimal pain Breathing comfortably NSR w/ stable BP Chest tube output low  Plan: Continue routine early postop  Rexene Alberts, MD 09/30/2017 6:45 PM

## 2017-09-30 NOTE — Anesthesia Procedure Notes (Signed)
Arterial Line Insertion Start/End4/02/2018 6:52 AM, 09/30/2017 6:55 AM Performed by: Josie Dixon, CRNA, CRNA  Patient location: Pre-op. Preanesthetic checklist: patient identified, IV checked, site marked, risks and benefits discussed, surgical consent, monitors and equipment checked, pre-op evaluation and anesthesia consent Lidocaine 1% used for infiltration and patient sedated Right, radial was placed Catheter size: 20 G Hand hygiene performed , maximum sterile barriers used  and Seldinger technique used Allen's test indicative of satisfactory collateral circulation Attempts: 1 Procedure performed without using ultrasound guided technique. Ultrasound Notes:anatomy identified, needle tip was noted to be adjacent to the nerve/plexus identified and no ultrasound evidence of intravascular and/or intraneural injection Following insertion, dressing applied and Biopatch. Post procedure assessment: normal  Patient tolerated the procedure well with no immediate complications.

## 2017-09-30 NOTE — Progress Notes (Signed)
Patient complaining about something leaking on her neck. All connections tightened. RN noticed central line pulled out more than previous. Called Dr. Orene Desanctis who states to pull the central line.

## 2017-09-30 NOTE — Anesthesia Procedure Notes (Addendum)
Procedure Name: Intubation Date/Time: 09/30/2017 7:41 AM Performed by: Josie Dixon, CRNA Pre-anesthesia Checklist: Patient identified, Emergency Drugs available, Suction available and Patient being monitored Patient Re-evaluated:Patient Re-evaluated prior to induction Oxygen Delivery Method: Circle system utilized Preoxygenation: Pre-oxygenation with 100% oxygen Induction Type: IV induction Ventilation: Mask ventilation throughout procedure Laryngoscope Size: Mac and 3 Grade View: Grade I Tube type: Oral Tube size: 8.5 (per surgeon's request. ) mm Number of attempts: 1 Airway Equipment and Method: Stylet Placement Confirmation: ETT inserted through vocal cords under direct vision,  positive ETCO2 and breath sounds checked- equal and bilateral Secured at: 20 cm Tube secured with: Tape Dental Injury: Teeth and Oropharynx as per pre-operative assessment  Comments: 0254: SLT changed to DLT; mask ventilation performed; 37Fr was too big; Dr. Orene Desanctis intubated with 35Fr. Grade 1 view with Miller 2.

## 2017-09-30 NOTE — Anesthesia Procedure Notes (Addendum)
Procedure Name: Intubation Date/Time: 09/30/2017 8:14 AM Performed by: Josie Dixon, CRNA Pre-anesthesia Checklist: Patient identified, Emergency Drugs available, Suction available and Patient being monitored Patient Re-evaluated:Patient Re-evaluated prior to induction Oxygen Delivery Method: Circle system utilized Preoxygenation: Pre-oxygenation with 100% oxygen Induction Type: IV induction Ventilation: Mask ventilation throughout procedure Laryngoscope Size: Miller and 2 Grade View: Grade I Endobronchial tube: Left and Double lumen EBT and 35 Fr Laser Tube: Cuffed inflated with minimal occlusive pressure - saline Number of attempts: 2 (37 Fr tube was too big; Dr. Orene Desanctis intubated with 35Fr DLT.) Airway Equipment and Method: Stylet and Fiberoptic brochoscope Placement Confirmation: ETT inserted through vocal cords under direct vision,  positive ETCO2 and breath sounds checked- equal and bilateral Secured at: 29 (at the lips.) cm Tube secured with: Tape Dental Injury: Teeth and Oropharynx as per pre-operative assessment

## 2017-09-30 NOTE — Transfer of Care (Signed)
Immediate Anesthesia Transfer of Care Note  Patient: Brandi Crosby  Procedure(s) Performed: VIDEO BRONCHOSCOPY (N/A ) VIDEO ASSISTED THORACOSCOPY (VATS)/LUNG RESECTION (Left Chest) NODE DISSECTION (Left Chest)  Patient Location: PACU  Anesthesia Type:General  Level of Consciousness: awake, alert  and oriented  Airway & Oxygen Therapy: Patient connected to face mask oxygen  Post-op Assessment: Post -op Vital signs reviewed and stable  Post vital signs: stable  Last Vitals:  Vitals Value Taken Time  BP    Temp    Pulse    Resp    SpO2      Last Pain:  Vitals:   09/30/17 0603  TempSrc:   PainSc: 0-No pain         Complications: No apparent anesthesia complications

## 2017-09-30 NOTE — Anesthesia Procedure Notes (Addendum)
Central Venous Catheter Insertion Performed by: Rica Koyanagi, MD, anesthesiologist Start/End4/02/2018 7:00 AM, 09/30/2017 7:13 AM Patient location: Pre-op. Preanesthetic checklist: patient identified, IV checked, site marked, risks and benefits discussed, surgical consent, monitors and equipment checked, pre-op evaluation and timeout performed Position: Trendelenburg Lidocaine 1% used for infiltration Hand hygiene performed , maximum sterile barriers used  and Seldinger technique used Catheter size: 7.5 Fr Central line was placed.Double lumen Procedure performed using ultrasound guided technique. Ultrasound Notes:anatomy identified, needle tip was noted to be adjacent to the nerve/plexus identified, no ultrasound evidence of intravascular and/or intraneural injection and image(s) printed for medical record Attempts: 1 Following insertion, line sutured, dressing applied and Biopatch. Post procedure assessment: blood return through all ports  Patient tolerated the procedure well with no immediate complications.

## 2017-09-30 NOTE — Brief Op Note (Addendum)
09/30/2017  12:40 PM  PATIENT:  Pattricia Boss  62 y.o. female  PRE-OPERATIVE DIAGNOSIS:  LLL LUNG MASS  POST-OPERATIVE DIAGNOSIS:  LLL LUNG MASS  PROCEDURE:  Procedure(s):  VIDEO BRONCHOSCOPY (N/A)  VIDEO ASSISTED THORACOSCOPY/MINI THORACOTOMY -Left Lower Lobectomy -Lymph Node Dissection -Insertion of On-Q Anesthetic Catheter  SURGEON:  Surgeon(s) and Role:    * Grace Isaac, MD - Primary  PHYSICIAN ASSISTANT: Ellwood Handler PA-C  ANESTHESIA:   general  EBL:  350 mL   BLOOD ADMINISTERED:none  DRAINS: 28 Straight and 28 Blake   LOCAL MEDICATIONS USED:  BUPIVICAINE   SPECIMEN:  Source of Specimen:  Left Lower Lobe, Lymph Nodes, Additional Bronchial Margin  DISPOSITION OF SPECIMEN:  PATHOLOGY  COUNTS:  YES  TOURNIQUET:  * No tourniquets in log *  DICTATION: .Dragon Dictation  PLAN OF CARE: Admit to inpatient   PATIENT DISPOSITION:  ICU - extubated and stable.   Delay start of Pharmacological VTE agent (>24hrs) due to surgical blood loss or risk of bleeding: yes

## 2017-09-30 NOTE — Anesthesia Preprocedure Evaluation (Addendum)
Anesthesia Evaluation  Patient identified by MRN, date of birth, ID band Patient awake    Reviewed: Allergy & Precautions, NPO status , Patient's Chart, lab work & pertinent test results  Airway Mallampati: II  TM Distance: <3 FB Neck ROM: Full  Mouth opening: Limited Mouth Opening  Dental  (+) Teeth Intact   Pulmonary Current Smoker,  Lung mass   breath sounds clear to auscultation       Cardiovascular negative cardio ROS   Rhythm:Regular Rate:Normal     Neuro/Psych    GI/Hepatic negative GI ROS, Neg liver ROS,   Endo/Other  negative endocrine ROS  Renal/GU negative Renal ROS     Musculoskeletal   Abdominal   Peds  Hematology negative hematology ROS (+)   Anesthesia Other Findings   Reproductive/Obstetrics                            Anesthesia Physical Anesthesia Plan  ASA: III  Anesthesia Plan: General   Post-op Pain Management:    Induction: Intravenous  PONV Risk Score and Plan: 3 and Treatment may vary due to age or medical condition, Dexamethasone, Ondansetron and Midazolam  Airway Management Planned: Double Lumen EBT  Additional Equipment: Arterial line and CVP  Intra-op Plan:   Post-operative Plan: Extubation in OR  Informed Consent: I have reviewed the patients History and Physical, chart, labs and discussed the procedure including the risks, benefits and alternatives for the proposed anesthesia with the patient or authorized representative who has indicated his/her understanding and acceptance.   Dental advisory given  Plan Discussed with: CRNA  Anesthesia Plan Comments:        Anesthesia Quick Evaluation

## 2017-10-01 ENCOUNTER — Other Ambulatory Visit: Payer: Self-pay

## 2017-10-01 ENCOUNTER — Encounter (HOSPITAL_COMMUNITY): Payer: Self-pay | Admitting: Cardiothoracic Surgery

## 2017-10-01 ENCOUNTER — Inpatient Hospital Stay (HOSPITAL_COMMUNITY): Payer: 59

## 2017-10-01 LAB — BASIC METABOLIC PANEL
Anion gap: 11 (ref 5–15)
BUN: <5 mg/dL — ABNORMAL LOW (ref 6–20)
CO2: 24 mmol/L (ref 22–32)
Calcium: 8.6 mg/dL — ABNORMAL LOW (ref 8.9–10.3)
Chloride: 103 mmol/L (ref 101–111)
Creatinine, Ser: 0.74 mg/dL (ref 0.44–1.00)
GFR calc Af Amer: >60 mL/min (ref >60)
GFR calc non Af Amer: >60 mL/min (ref >60)
Glucose, Bld: 127 mg/dL — ABNORMAL HIGH (ref 65–99)
Potassium: 3.7 mmol/L (ref 3.5–5.1)
Sodium: 138 mmol/L (ref 135–145)

## 2017-10-01 LAB — CBC
HCT: 35.6 % — ABNORMAL LOW (ref 36.0–46.0)
Hemoglobin: 12.2 g/dL (ref 12.0–15.0)
MCH: 32.8 pg (ref 26.0–34.0)
MCHC: 34.3 g/dL (ref 30.0–36.0)
MCV: 95.7 fL (ref 78.0–100.0)
Platelets: 238 10*3/uL (ref 150–400)
RBC: 3.72 MIL/uL — ABNORMAL LOW (ref 3.87–5.11)
RDW: 12.3 % (ref 11.5–15.5)
WBC: 14.5 10*3/uL — ABNORMAL HIGH (ref 4.0–10.5)

## 2017-10-01 LAB — POCT I-STAT 3, ART BLOOD GAS (G3+)
Acid-Base Excess: 1 mmol/L (ref 0.0–2.0)
Bicarbonate: 24.9 mmol/L (ref 20.0–28.0)
O2 Saturation: 93 %
Patient temperature: 98.1
TCO2: 26 mmol/L (ref 22–32)
pCO2 arterial: 36.8 mmHg (ref 32.0–48.0)
pH, Arterial: 7.437 (ref 7.350–7.450)
pO2, Arterial: 63 mmHg — ABNORMAL LOW (ref 83.0–108.0)

## 2017-10-01 NOTE — Progress Notes (Signed)
Pt arrived on unit from [facility]. Current dressing on chest tube site soaked through. This RN changed dressing. Noted air movement around chest tube site when changing the dressing. No air leak present in sahara. Tacy Dura, [place] paged and stated that air movement was not new and to continue to monitor site and sahara for air leak. RN will continue to monitor pt.

## 2017-10-01 NOTE — Progress Notes (Addendum)
[facility] DAILY ICU PROGRESS NOTE                   [address]            [place], [address]          [phone]   1 Day Post-Op Procedure(s) (LRB): VIDEO BRONCHOSCOPY (N/A) VIDEO ASSISTED THORACOSCOPY (VATS)/LUNG RESECTION (Left) NODE DISSECTION (Left)  Total Length of Stay:  LOS: 1 day   Subjective:  States doing okay.  Has some pain which is relieved with pain medication.  Denies N/V.  Tolerating oral intake.  Objective: Vital signs in last 24 hours: Temp:  [97.7 F (36.5 C)-99.5 F (37.5 C)] 99.5 F (37.5 C) (04/10 0400) Pulse Rate:  [65-84] 72 (04/09 2100) Cardiac Rhythm: Normal sinus rhythm (04/09 1530) Resp:  [9-35] 16 (04/10 0632) BP: (98-133)/(54-90) 133/75 (04/09 1658) SpO2:  [95 %-99 %] 98 % (04/10 4696) Arterial Line BP: (115-131)/(59-71) 119/71 (04/09 2100) FiO2 (%):  [98 %] 98 % (04/10 0632)  Filed Weights   09/30/17 0546  Weight: 126 lb (57.2 kg)    Weight change:    Intake/Output from previous day: 04/09 0701 - 04/10 0700 In: 3385 [I.V.:3085; IV Piggyback:200] Out: 3435 [Urine:2755; Blood:350; Chest Tube:330]  Current Meds: Scheduled Meds: . acetaminophen  1,000 mg Oral Q6H   Or  . acetaminophen (TYLENOL) oral liquid 160 mg/5 mL  1,000 mg Oral Q6H  . bisacodyl  10 mg Oral Daily  . escitalopram  10 mg Oral Daily  . fentaNYL   Intravenous Q4H  . levalbuterol  0.63 mg Nebulization TID  . metoCLOPramide (REGLAN) injection  10 mg Intravenous Q6H  . senna-docusate  1 tablet Oral QHS   Continuous Infusions: . dextrose 5 % and 0.45% NaCl 100 mL/hr at 10/01/17 0630  . potassium chloride     PRN Meds:.ALPRAZolam, diphenhydrAMINE **OR** diphenhydrAMINE, naloxone **AND** sodium chloride flush, ondansetron (ZOFRAN) IV, oxyCODONE, potassium chloride, traMADol  General appearance: alert, cooperative and no distress Heart: regular rate and rhythm Lungs: clear to auscultation bilaterally Abdomen: soft, non-tender; bowel sounds normal; no  masses,  no organomegaly Extremities: extremities normal, atraumatic, no cyanosis or edema Wound: clean and dr  Lab Results: CBC: Recent Labs    10/01/17 0518  WBC 14.5*  HGB 12.2  HCT 35.6*  PLT 238   BMET:  Recent Labs    10/01/17 0518  NA 138  K 3.7  CL 103  CO2 24  GLUCOSE 127*  BUN <5*  CREATININE 0.74  CALCIUM 8.6*    CMET: Lab Results  Component Value Date   WBC 14.5 (H) 10/01/2017   HGB 12.2 10/01/2017   HCT 35.6 (L) 10/01/2017   PLT 238 10/01/2017   GLUCOSE 127 (H) 10/01/2017   ALT 23 09/25/2017   AST 21 09/25/2017   NA 138 10/01/2017   K 3.7 10/01/2017   CL 103 10/01/2017   CREATININE 0.74 10/01/2017   BUN <5 (L) 10/01/2017   CO2 24 10/01/2017   INR 0.98 09/25/2017      PT/INR: No results for input(s): LABPROT, INR in the last 72 hours. Radiology: Dg Chest Port 1 View  Result Date: 09/30/2017 CLINICAL DATA:  Status post VATS.  Left chest tubes. EXAM: PORTABLE CHEST 1 VIEW COMPARISON:  09/30/2017. FINDINGS: Right IJ line noted with tip over superior vena cava. Postsurgical changes left lung with left chest tubes noted. Mild left apical and left base pneumothorax may be present. Atelectasis right upper lung. Mild  subsegmental atelectasis both lung bases. Cardiomegaly with normal pulmonary vascularity. IMPRESSION: 1. Postsurgical changes left lung with chest tubes noted. Mild left apical and left base pneumothorax . Right IJ line in good anatomic position. 2. Right upper lobe atelectasis. Mild subsegmental atelectasis both lung bases. Critical Value/emergent results were called by telephone at the time of interpretation on 09/30/2017 at 1:32 pm to nurse Corey Skains, who verbally acknowledged these results. Electronically Signed   By: Marcello Moores  Register   On: 09/30/2017 13:35     Assessment/Plan: S/P Procedure(s) (LRB): VIDEO BRONCHOSCOPY (N/A) VIDEO ASSISTED THORACOSCOPY (VATS)/LUNG RESECTION (Left) NODE DISSECTION (Left)  1. S/P Left Lower Lobectomy- chest  tube output has been low since surgery, no air leak is present, CXR is free from pneumothorax.. Will place chest tubes to water seal today 2. CV- hemodynamically stable 3. Pulm- wean oxygen as tolerated, continue IS 4. D/C Arterial line 5. GI- tolerating oral intake, ok to progress diet, will place IV Fluid to KVO 6. D/C Foley catheter- patient would like out, instructed patient to see how she feels and make sure she can get up and move around and then we can remove catheter this afternoon if she doesn't have difficulty. 7. Dispo- patient stable, chest tubes to water seal, d/c arterial line, fluids to [facility], possibly remove foley later today, transfer to [place] 10/01/2017 7:54 AM   No air leak  Ct to water seal I have seen and examined Brandi Crosby and agree with the above assessment  and plan.  Grace Isaac MD Beeper [phone] Office [phone] 10/01/2017 9:04 AM

## 2017-10-01 NOTE — Care Management Note (Signed)
Case Management Note Marvetta Gibbons RN, BSN Unit 4E-Case Manager-- [place] coverage [phone]  Patient Details  Name: Brandi Crosby MRN: [id number] Date of Birth: 17-Mar-1956  Subjective/Objective:   Pt admitted s/p VIDEO BRONCHOSCOPY (N/A) [place] (VATS)/LUNG RESECTION (Left) NODE DISSECTION (Left)   Action/Plan: PTA pt lived at home with spouse- anticipate return home- CM to follow for transition of care needs-  Expected Discharge Date:                  Expected Discharge Plan:  Home/Self Care  In-House Referral:     Discharge planning Services  CM Consult  Post Acute Care Choice:    Choice offered to:     DME Arranged:    DME Agency:     HH Arranged:    [place] Agency:     Status of Service:  In process, will continue to follow  If discussed at Long Length of Stay Meetings, dates discussed:    Discharge Disposition:   Additional Comments:  Dawayne Patricia, RN 10/01/2017, 11:30 AM

## 2017-10-01 NOTE — Plan of Care (Signed)
  Problem: Respiratory: Goal: Respiratory status will improve Outcome: Progressing   Problem: Clinical Measurements: Goal: Respiratory complications will improve Outcome: Progressing

## 2017-10-01 NOTE — Progress Notes (Signed)
Nurse paged me as patient with bloody ooze from left chest tube site. She has already changed the dressing once. I removed the dressing. The anterior chest tube site has minor bloody ooze. All sutures are intact. Nurse instructed to place abd, 4x4s and tape. Change as needed. She also has minor sero sanguinous ooze from On Q site.

## 2017-10-01 NOTE — Anesthesia Postprocedure Evaluation (Signed)
Anesthesia Post Note  Patient: Brandi Crosby  Procedure(s) Performed: VIDEO BRONCHOSCOPY (N/A ) VIDEO ASSISTED THORACOSCOPY (VATS)/LUNG RESECTION (Left Chest) NODE DISSECTION (Left Chest)     Patient location during evaluation: PACU Anesthesia Type: General Level of consciousness: awake and sedated Pain management: pain level controlled Vital Signs Assessment: post-procedure vital signs reviewed and stable Respiratory status: spontaneous breathing, nonlabored ventilation, respiratory function stable and patient connected to nasal cannula oxygen Cardiovascular status: blood pressure returned to baseline and stable Postop Assessment: no apparent nausea or vomiting Anesthetic complications: no    Last Vitals:  Vitals:   10/01/17 0808 10/01/17 0832  BP:  135/70  Pulse:  72  Resp: 17 19  Temp: 36.8 C   SpO2: 97% 93%    Last Pain:  Vitals:   10/01/17 0808  TempSrc: Oral  PainSc: 2                  Paiton Fosco,JAMES TERRILL

## 2017-10-01 NOTE — Discharge Summary (Signed)
Physician Discharge Summary  Patient ID: Brandi Crosby MRN: [id number] DOB/AGE: [birth year] 62 y.o.  Admit date: 09/30/2017 Discharge date: 10/04/2017  Admission Diagnoses:  Patient Active Problem List   Diagnosis Date Noted  . S/P lobectomy of lung 09/30/2017   Discharge Diagnoses:   Patient Active Problem List   Diagnosis Date Noted  . S/P lobectomy of lung 09/30/2017    Discharged Condition: good  History of Present Illness:  Brandi Crosby is a 62 yo white female referred to [facility] for evaluation of a left sided lung mass.  The patient has a long standing smoking history of a 1/2 ppd for the past 20 years.  She developed a complaint of tooth and mouth pain.  She was evaluated at an Endodontist office at which time xrays were obtained.  These showed evidence of a sinus infection rather than oral issues.  She was treated with Augmentin, but became quite ill due to the medication.  During her course of treatment she was noted to have wheezing on exam and a CXR was obtained which was abnormal.  CT scan was obtained and revealed the left lung mass.   PET CT scan was also obtained and the lesion was noted to be hypermetabolic.  She was evaluated by Dr. Servando Snare who recommended she undergo VATS procedure with surgical resection for definitive diagnosis.  The risks and benefits of the procedure were explained to the patient and she was agreeable to proceed.   Hospital Course:   Brandi Crosby presented to [facility] on 09/30/2017.  She was taken to the operating room and underwent Video Bronchoscopy. During this procedure bronchial brushings were obtained and ultimately showed malignancy.  Surgery continued with VATS procedure with Mini Thoracotomy with Left Lower Lobectomy, lymph node sampling, and insertion of ON-Q anesthetic catheter.  She tolerated the procedure without difficulty, was extubated and taken to the SICU in stable condition.  She did well post operatively. Her arterial line and  foley were removed without difficulty. Follow up CXR was free from pneuothorax.  Her chest tubes did not exhibit and air leak and were placed on water seal on POD #1. She was ambulating and felt stable for transfer to the step down unit on 10/01/2017. Anterior chest tube was removed on 04/11 and remaining chest tube was removed on 04/12. Follow up chest x ray showed minor atx, tiny pneumothorax that is stable on left . [name]    Significant Diagnostic Studies: nuclear medicine:   PET CT:1. 2.7 cm left lower lobe pulmonary lesion is hypermetabolic and consistent with primary lung neoplasm. No mediastinal or hilar adenopathy or evidence of metastatic disease. 2. 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 Recommend ENT consultation.  Treatments: surgery:   FINAL DIAGNOSIS Diagnosis: PATHOLOGY 1. 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 Bronchial margin(s), resection, New - NO CARCINOMA IDENTIFIED 3. Lymph node, biopsy, 8 - NO CARCINOMA IDENTIFIED IN ONE LYMPH NODE (0/1) - SEE COMMENT 4. Lymph node, biopsy, 11 - NO CARCINOMA IDENTIFIED IN ONE LYMPH NODE (0/1) - SEE COMMENT 5. Lymph node, biopsy, 12L - NO CARCINOMA IDENTIFIED IN ONE LYMPH NODE (0/1) - SEE COMMENT 6. Lymph node, biopsy, 10 - NO CARCINOMA IDENTIFIED IN ONE LYMPH NODE (0/1) - SEE COMMENT 7. Lymph node, biopsy, 11 #2 - NO CARCINOMA IDENTIFIED IN ONE LYMPH NODE (0/1) -  SEE COMMENT 8. Lymph node, biopsy, 4L - NO CARCINOMA IDENTIFIED IN ONE LYMPH NODE (0/1) - SEE COMMENT 9. Lymph node, biopsy, 10 #2 - NO CARCINOMA IDENTIFIED IN ONE LYMPH NODE (0/1) - SEE COMMENT 1 of 3 FINAL for Brandi Crosby, Brandi Crosby ([id number]) Microscopic Comment 1. LUNG: Procedure: Lobectomy Specimen Laterality: Left Tumor Site: Lower lobe Tumor Size: 3.5  cm Tumor Focality: Unifocal Histologic Type: Small cell carcinoma Visceral Pleura Invasion: Not identified Lymphovascular Invasion: Not identified Direct Invasion of Adjacent Structures: Tumor invades the bronchus Margins: Uninvolved by carcinoma Treatment effect: No known presurgical therapy 1080Regional Lymph Nodes: Number of Lymph Nodes Involved: 0 Number of Lymph Nodes Examined: 7 Pathologic Stage Classification (pTNM, AJCC 8th Edition): pT2a pN0 Representative Tumor Block: 1D Comment(s): The tumor is positive for cytokeratin AE1/3 and synaptophysin but negative for chromogranin. Dr. Vic Ripper reviewed the case and agrees with the above diagnosis. 3. 4, and 5. Cytokeratin AE1/3 was performed to exclude nodal metastasis. 6. -9. Cytokeratin AE1/3 is pending to exclude nodal metastasis. Thressa Sheller MD Pathologist, Electronic Signature (Case signed 10/03/2017) Intraoperative Diagnosis 1. LEFT LOWER LOBE, FROZEN SECTION DIAGNOSIS (THREE BLOCKS) - SMALL CELL CARCINOMA. VASCULAR MARGIN IS NEGATIVE. BRONCHIAL MARGIN SHOWS DETACHED FRAGMENTS OF TUMOR IN THE BRONCHIAL LUMEN. TUMOR GROSSLY ABUTS THE BRONCHIAL MARGIN. (NK) 2. NEW BRONCHIAL MARGIN, FROZEN SECTION DIAGNOSIS - NEGATIVE FOR CARCINOMA. (NK) Specimen Gross and Clinical Information Specimen(s) Obtained: 1. Lung, resection (segmental or lobe), Left Lower Lobe 2. Bronchial margin(s), resection, New 3. Lymph node, biopsy, 8 4. Lymph node, biopsy, 11 5. Lymph node, biopsy, 12L 6. Lymph node, biopsy, 10 7. Lymph node, biopsy, 11 #2 8. Lymph node, biopsy, 4L 9. Lymph node, biopsy, 10 #2 Specimen Clinical Information 1. LLL lung mass (nt) 2 of 3 FINAL for Brandi Crosby, Brandi Crosby ([id number]) Gross 1. Received fresh for rapid intraoperative consult is a 15.5 x 10.2 x 4.6 cm, 196-gram lobe of lung, clinically left lower lobe. The pleura is smooth, tan-red to anthracotic. The cut surface shows a 3.5 x 3.2 x 1.8 cm firm tan nodular mass.  The mass is located at the hilum and involves the lumen of the bronchus with intraluminal tumor abutting the stapled bronchial margin of resection. The vascular margins grossly appear negative. The remainder of the parenchyma is spongy tan-red. At the hilum there is a 0.7 cm fragment of anthracotic lymph node. Sections are submitted for frozen section in three blocks. Sections are submitted in a total of eight cassettes. A = frozen section of bronchial margin. B = frozen section of vascular margins. C = frozen section of tumor. D, E, F = tumor. G = uninvolved parenchyma. H = lymph node. 2. Received fresh for rapid intraoperative consult is a 1.5 x 1.2 x 0.5 cm portion of bronchus with staple lines present at the ends. The specimen is oriented with black suture at the new bronchial margin and blue ink on the old margin. A section parallel to the staples at the new margin is submitted for frozen section in one block. 3. Received fresh are 1.0 x 0.8 x 0.3 cm of soft tan-yellow to anthracotic tissue. The specimen is submitted in toto. 4. Received fresh are 0.6 x 0.6 x 0.2 cm of soft to anthracotic tissue. The specimen is submitted in toto. 5. Received fresh are 0.4 x 0.4 x 0.2 cm of soft tan tissue. The specimen is submitted in toto. 6. Received fresh are 0.7 x 0.5 x 0.2 cm of soft tan tissue. The specimen is submitted  in toto. 7. Received fresh are 0.6 x 0.5 x 0.2 cm of soft to anthracotic tissue. The specimen is submitted in toto. 8. Received fresh are 0.6 x 0.3 x 0.2 cm of soft tan tissue. The specimen is submitted in toto. 9. Received fresh are 0.5 x 0.5 x 0.3 cm of soft to anthracotic tissue. The specimen is submitted in toto. (GRP:ecj 09/30/2017) Stain(s) used in Diagnosis: The following stain(s) were used in diagnosing the case: Chromogranin A, Synaptophysin, CK AE1AE3. The control(s) stained appropriately. Disclaimer Some of these immunohistochemical stains may have been developed and the  performance characteristics determined by [facility]. Some may not have been cleared or approved by the U.S. Food and Drug Administration. The FDA has determined that such clearance or approval is not necessary. This test is used for clinical purposes. It should not be regarded as investigational or for research. This laboratory is certified under the [place] (CLIA-88) as qualified to perform high complexity clinical laboratory testing. Report signed out from the following location(s) Technical Component was performed at [facility] [address] [place], [place], [place] [address]. CLIA #: [id number], Interpretation was performed at [facility] [place], [place], [place] [address]. CLIA #: [id number], 3 of       Discharge Exam: Blood pressure 122/64, pulse 82, temperature 98.6 F (37 C), temperature source Oral, resp. rate 16, height 5\' 5"  (1.651 m), weight 156 lb 6.4 oz (70.9 kg), SpO2 100 %.  General appearance: alert, cooperative and no distress Heart: regular rate and rhythm Lungs: mildly dim in bases Abdomen: benign Extremities: no edema or calf tenderness Wound: incis healing well   Disposition: Discharge disposition: 01-Home or Self Care       Discharge Instructions    Discharge patient   Complete by:  As directed    Discharge disposition:  01-Home or Self Care   Discharge patient date:  10/04/2017     Allergies as of 10/04/2017      Reactions   Augmentin [amoxicillin-pot Clavulanate] Diarrhea, Nausea And Vomiting   Penicillins Nausea And Vomiting, Other (See Comments)   Has patient had a PCN reaction causing immediate rash, facial/tongue/throat swelling, SOB or lightheadedness with hypotension: Yes Has patient had a PCN reaction causing severe rash involving mucus membranes or skin necrosis: No Has patient had a PCN reaction that required hospitalization: No Has patient  had a PCN reaction occurring within the last 10 years: Yes If all of the above answers are "NO", then may proceed with Cephalosporin use.      Medication List    TAKE these medications   ALPRAZolam 0.5 MG tablet Commonly known as:  XANAX Take 0.5 mg by mouth at bedtime as needed for anxiety or sleep.   cholecalciferol 1000 units tablet Commonly known as:  VITAMIN D Take 1,000 Units by mouth daily.   escitalopram 10 MG tablet Commonly known as:  LEXAPRO Take 10 mg by mouth daily.   ibuprofen 200 MG tablet Commonly known as:  ADVIL,MOTRIN Take 400 mg by mouth every 6 (six) hours as needed for headache or moderate pain.   oxyCODONE 5 MG immediate release tablet Commonly known as:  Oxy IR/ROXICODONE Take 1-2 tablets (5-10 mg total) by mouth every 6 (six) hours as needed for up to 7 days for severe pain.   vitamin C 1000 MG tablet Take 1,000 mg by mouth daily.      Follow-up Information    Lanelle Bal  B, MD Follow up on 10/16/2017.   Specialty:  Cardiothoracic Surgery Why:  Appointment is at 3:00, please get CXR at 2:30 at [facility] located on first floor of our office building Contact information: [place] [address] [phone]           Signed: John Giovanni 10/04/2017, 10:24 AM

## 2017-10-02 ENCOUNTER — Inpatient Hospital Stay (HOSPITAL_COMMUNITY): Payer: 59

## 2017-10-02 LAB — CBC
HCT: 28.1 % — ABNORMAL LOW (ref 36.0–46.0)
Hemoglobin: 9.7 g/dL — ABNORMAL LOW (ref 12.0–15.0)
MCH: 33 pg (ref 26.0–34.0)
MCHC: 34.5 g/dL (ref 30.0–36.0)
MCV: 95.6 fL (ref 78.0–100.0)
Platelets: 176 10*3/uL (ref 150–400)
RBC: 2.94 MIL/uL — ABNORMAL LOW (ref 3.87–5.11)
RDW: 12.3 % (ref 11.5–15.5)
WBC: 10 10*3/uL (ref 4.0–10.5)

## 2017-10-02 LAB — COMPREHENSIVE METABOLIC PANEL
ALT: 19 U/L (ref 14–54)
AST: 20 U/L (ref 15–41)
Albumin: 3.3 g/dL — ABNORMAL LOW (ref 3.5–5.0)
Alkaline Phosphatase: 48 U/L (ref 38–126)
Anion gap: 8 (ref 5–15)
BUN: <5 mg/dL — ABNORMAL LOW (ref 6–20)
CO2: 26 mmol/L (ref 22–32)
Calcium: 8.7 mg/dL — ABNORMAL LOW (ref 8.9–10.3)
Chloride: 105 mmol/L (ref 101–111)
Creatinine, Ser: 0.64 mg/dL (ref 0.44–1.00)
GFR calc Af Amer: >60 mL/min (ref >60)
GFR calc non Af Amer: >60 mL/min (ref >60)
Glucose, Bld: 131 mg/dL — ABNORMAL HIGH (ref 65–99)
Potassium: 3.4 mmol/L — ABNORMAL LOW (ref 3.5–5.1)
Sodium: 139 mmol/L (ref 135–145)
Total Bilirubin: 1 mg/dL (ref 0.3–1.2)
Total Protein: 5.6 g/dL — ABNORMAL LOW (ref 6.5–8.1)

## 2017-10-02 MED ORDER — FENTANYL 40 MCG/ML IV SOLN
INTRAVENOUS | Status: AC
  Administered 2017-10-02: 0 ug via INTRAVENOUS

## 2017-10-02 MED ORDER — POTASSIUM CHLORIDE CRYS ER 20 MEQ PO TBCR
40.0000 meq | EXTENDED_RELEASE_TABLET | Freq: Once | ORAL | Status: AC
  Administered 2017-10-02: 40 meq via ORAL
  Filled 2017-10-02: qty 2

## 2017-10-02 MED ORDER — POTASSIUM CHLORIDE 20 MEQ PO PACK
40.0000 meq | PACK | Freq: Once | ORAL | Status: DC
  Filled 2017-10-02: qty 2

## 2017-10-02 NOTE — Progress Notes (Addendum)
      [address]       [place] [address]             [phone]       2 Days Post-Op Procedure(s) (LRB): VIDEO BRONCHOSCOPY (N/A) VIDEO ASSISTED THORACOSCOPY (VATS)/LUNG RESECTION (Left) NODE DISSECTION (Left)  Subjective: Patient has no specific complaints this am. She is passing flatus.  Objective: Vital signs in last 24 hours: Temp:  [98 F (36.7 C)-98.6 F (37 C)] 98.6 F (37 C) (04/11 0325) Pulse Rate:  [70-82] 80 (04/11 0535) Cardiac Rhythm: Normal sinus rhythm (04/11 0325) Resp:  [12-21] 20 (04/11 0535) BP: (135-158)/(56-80) 158/56 (04/11 0325) SpO2:  [93 %-98 %] 96 % (04/11 0535) FiO2 (%):  [97 %] 97 % (04/10 1450) Weight:  [156 lb 6.4 oz (70.9 kg)] 156 lb 6.4 oz (70.9 kg) (04/11 0628)      Intake/Output from previous day: 04/10 0701 - 04/11 0700 In: 712 [P.O.:240; I.V.:472] Out: 410 [Urine:200; Chest Tube:210]   Physical Exam:  Cardiovascular: RRR, Pulmonary: Coarse on the left and clear on the right. Abdomen: Soft, non tender, bowel sounds present. Extremities: No lower extremity edema. Wounds: There is bloody drainage from chest tube sites Chest Tubes:to water seal, no air leak  Lab Results: CBC: Recent Labs    10/01/17 0518 10/02/17 0316  WBC 14.5* 10.0  HGB 12.2 9.7*  HCT 35.6* 28.1*  PLT 238 176   BMET:  Recent Labs    10/01/17 0518 10/02/17 0316  NA 138 139  K 3.7 3.4*  CL 103 105  CO2 24 26  GLUCOSE 127* 131*  BUN <5* <5*  CREATININE 0.74 0.64  CALCIUM 8.6* 8.7*    PT/INR: No results for input(s): LABPROT, INR in the last 72 hours. ABG:  INR: Will add last result for INR, ABG once components are confirmed Will add last 4 CBG results once components are confirmed  Assessment/Plan:  1. CV - SR in the 70-80's. 2.  Pulmonary - Chest tubes with 210 last 24 hours. Chest tubes are to water seal . There is no air leak. CXR this am appears stable. Hope to remove one chest tube soon. Encourage incentive  spirometer.Await final pathology. 3. Ambulate tid  Donielle M ZimmermanPA-C 10/02/2017,7:43 AM     Will d/c anterio chest tube  Path still pending  Expected Acute  Blood - loss Anemia- continue to monitor  I have seen and examined Pattricia Boss and agree with the above assessment  and plan.  Grace Isaac MD Beeper [phone] Office [phone] 10/02/2017 8:40 AM

## 2017-10-02 NOTE — Progress Notes (Signed)
A lot of drainage from Chest tube insertion area, changed dressing three times. Anterior chest tube removed at 1230. Wasted fentanyl 15 ml, witness by [employer]. HS [employer]

## 2017-10-03 ENCOUNTER — Inpatient Hospital Stay (HOSPITAL_COMMUNITY): Payer: 59

## 2017-10-03 LAB — BASIC METABOLIC PANEL
Anion gap: 8 (ref 5–15)
BUN: <5 mg/dL — ABNORMAL LOW (ref 6–20)
CO2: 24 mmol/L (ref 22–32)
Calcium: 8.6 mg/dL — ABNORMAL LOW (ref 8.9–10.3)
Chloride: 105 mmol/L (ref 101–111)
Creatinine, Ser: 0.63 mg/dL (ref 0.44–1.00)
GFR calc Af Amer: >60 mL/min (ref >60)
GFR calc non Af Amer: >60 mL/min (ref >60)
Glucose, Bld: 103 mg/dL — ABNORMAL HIGH (ref 65–99)
Potassium: 3.4 mmol/L — ABNORMAL LOW (ref 3.5–5.1)
Sodium: 137 mmol/L (ref 135–145)

## 2017-10-03 LAB — CBC
HCT: 26.3 % — ABNORMAL LOW (ref 36.0–46.0)
Hemoglobin: 9.2 g/dL — ABNORMAL LOW (ref 12.0–15.0)
MCH: 33 pg (ref 26.0–34.0)
MCHC: 35 g/dL (ref 30.0–36.0)
MCV: 94.3 fL (ref 78.0–100.0)
Platelets: 161 10*3/uL (ref 150–400)
RBC: 2.79 MIL/uL — ABNORMAL LOW (ref 3.87–5.11)
RDW: 12.2 % (ref 11.5–15.5)
WBC: 7.3 10*3/uL (ref 4.0–10.5)

## 2017-10-03 MED ORDER — POLYETHYLENE GLYCOL 3350 17 G PO PACK
17.0000 g | PACK | Freq: Every day | ORAL | Status: DC | PRN
  Administered 2017-10-03: 17 g via ORAL
  Filled 2017-10-03: qty 1

## 2017-10-03 MED ORDER — LEVALBUTEROL HCL 0.63 MG/3ML IN NEBU
0.6300 mg | INHALATION_SOLUTION | Freq: Two times a day (BID) | RESPIRATORY_TRACT | Status: DC
  Filled 2017-10-03: qty 3

## 2017-10-03 NOTE — Progress Notes (Addendum)
      [address]       [place] [address]             [phone]      3 Days Post-Op Procedure(s) (LRB): VIDEO BRONCHOSCOPY (N/A) VIDEO ASSISTED THORACOSCOPY (VATS)/LUNG RESECTION (Left) NODE DISSECTION (Left)   Subjective:  Patient feels depressed this morning.  Doesn't have any specific complaints.  Just tired of being at hospital, wants chest tube out.  No BM  Objective: Vital signs in last 24 hours: Temp:  [97.9 F (36.6 C)-98.8 F (37.1 C)] 98.1 F (36.7 C) (04/12 0747) Pulse Rate:  [74-81] 78 (04/12 0747) Cardiac Rhythm: Normal sinus rhythm (04/12 0747) Resp:  [17-22] 17 (04/12 0747) BP: (125-151)/(56-86) 151/69 (04/12 0747) SpO2:  [96 %-100 %] 98 % (04/12 0747)  Intake/Output from previous day: 04/11 0701 - 04/12 0700 In: 855 [P.O.:800; I.V.:55] Out: 50 [Chest Tube:50]  General appearance: alert, cooperative and no distress Heart: regular rate and rhythm Lungs: clear to auscultation bilaterally Abdomen: soft, non-tender; bowel sounds normal; no masses,  no organomegaly Extremities: extremities normal, atraumatic, no cyanosis or edema Wound: clean and dry  Lab Results: Recent Labs    10/02/17 0316 10/03/17 0323  WBC 10.0 7.3  HGB 9.7* 9.2*  HCT 28.1* 26.3*  PLT 176 161   BMET:  Recent Labs    10/02/17 0316 10/03/17 0323  NA 139 137  K 3.4* 3.4*  CL 105 105  CO2 26 24  GLUCOSE 131* 103*  BUN <5* <5*  CREATININE 0.64 0.63  CALCIUM 8.7* 8.6*    PT/INR: No results for input(s): LABPROT, INR in the last 72 hours. ABG    Component Value Date/Time   PHART 7.437 10/01/2017 0525   HCO3 24.9 10/01/2017 0525   TCO2 26 10/01/2017 0525   ACIDBASEDEF 0.1 09/25/2017 1201   O2SAT 93.0 10/01/2017 0525   CBG (last 3)  No results for input(s): GLUCAP in the last 72 hours.  Assessment/Plan: S/P Procedure(s) (LRB): VIDEO BRONCHOSCOPY (N/A) VIDEO ASSISTED THORACOSCOPY (VATS)/LUNG RESECTION (Left) NODE DISSECTION (Left)  1. CV-  NSR, + HTN- likely pain/stress related- instructed patient to follow up with PCP  2. Pulm- no acute issues, CT without air leak, minimal output, CXR is free from pneumothorax will d/c chest tube today 3. GI- mild constipation, will add Miralax prn, patient is going to try prune juice this morning 4. D/C central line 5. Dispo- patient stable, d/c chst tube, On-Q today, home in AM if remains stable   LOS: 3 days    Ellwood Handler 10/03/2017  Tubes out Chest xray in am Poss d/c tomorrow after xray Path still pending I have seen and examined Brandi Crosby and agree with the above assessment  and plan.  Grace Isaac MD Beeper [phone] Office [phone] 10/03/2017 4:16 PM

## 2017-10-04 ENCOUNTER — Inpatient Hospital Stay (HOSPITAL_COMMUNITY): Payer: 59

## 2017-10-04 MED ORDER — OXYCODONE HCL 5 MG PO TABS: 5.0000 mg | ORAL_TABLET | Freq: Four times a day (QID) | ORAL | 0 refills | Status: AC | PRN

## 2017-10-04 NOTE — Progress Notes (Signed)
[address]       [place] [address]             [phone]      4 Days Post-Op Procedure(s) (LRB): VIDEO BRONCHOSCOPY (N/A) VIDEO ASSISTED THORACOSCOPY (VATS)/LUNG RESECTION (Left) NODE DISSECTION (Left) Subjective: Anxious for discharge Feels well  Objective: Vital signs in last 24 hours: Temp:  [98 F (36.7 C)-99.2 F (37.3 C)] 98.6 F (37 C) (04/13 0800) Pulse Rate:  [68-102] 82 (04/13 0916) Cardiac Rhythm: Normal sinus rhythm (04/13 0726) Resp:  [16-25] 16 (04/13 0916) BP: (122-148)/(64-98) 122/64 (04/13 0800) SpO2:  [96 %-100 %] 100 % (04/13 0916)  Hemodynamic parameters for last 24 hours:    Intake/Output from previous day: 04/12 0701 - 04/13 0700 In: 720 [P.O.:720] Out: -  Intake/Output this shift: Total I/O In: 240 [P.O.:240] Out: -   General appearance: alert, cooperative and no distress Heart: regular rate and rhythm Lungs: mildly dim in bases Abdomen: benign Extremities: no edema or calf tenderness Wound: incis healing well  Lab Results: Recent Labs    10/02/17 0316 10/03/17 0323  WBC 10.0 7.3  HGB 9.7* 9.2*  HCT 28.1* 26.3*  PLT 176 161   BMET:  Recent Labs    10/02/17 0316 10/03/17 0323  NA 139 137  K 3.4* 3.4*  CL 105 105  CO2 26 24  GLUCOSE 131* 103*  BUN <5* <5*  CREATININE 0.64 0.63  CALCIUM 8.7* 8.6*    PT/INR: No results for input(s): LABPROT, INR in the last 72 hours. ABG    Component Value Date/Time   PHART 7.437 10/01/2017 0525   HCO3 24.9 10/01/2017 0525   TCO2 26 10/01/2017 0525   ACIDBASEDEF 0.1 09/25/2017 1201   O2SAT 93.0 10/01/2017 0525   CBG (last 3)  No results for input(s): GLUCAP in the last 72 hours.  Meds Scheduled Meds: . acetaminophen  1,000 mg Oral Q6H   Or  . acetaminophen (TYLENOL) oral liquid 160 mg/5 mL  1,000 mg Oral Q6H  . bisacodyl  10 mg Oral Daily  . escitalopram  10 mg Oral Daily  . levalbuterol  0.63 mg Nebulization BID  . senna-docusate  1 tablet Oral  QHS   Continuous Infusions: . dextrose 5 % and 0.45% NaCl Stopped (10/02/17 1230)  . potassium chloride     PRN Meds:.ALPRAZolam, diphenhydrAMINE **OR** diphenhydrAMINE, naloxone **AND** sodium chloride flush, ondansetron (ZOFRAN) IV, oxyCODONE, polyethylene glycol, potassium chloride, traMADol  Xrays Dg Chest 2 View  Result Date: 10/04/2017 CLINICAL DATA:  Chest tube removal. EXAM: CHEST - 2 VIEW COMPARISON:  Chest x-ray dated 10/03/2017. FINDINGS: LEFT-sided chest tube has been removed. Tiny residual pneumothorax at the LEFT lung apex, stable compared to yesterday's exam. Probable mild atelectasis at the LEFT lung base. Lungs otherwise clear. Heart size and mediastinal contours are stable. No acute or suspicious osseous finding. IMPRESSION: Status post LEFT-sided chest tube removal. Persistent tiny pneumothorax at the LEFT lung apex, stable. Probable mild atelectasis at the LEFT lung base. Electronically Signed   By: Franki Cabot M.D.   On: 10/04/2017 07:44   Dg Chest Port 1 View  Result Date: 10/03/2017 CLINICAL DATA:  62 year old female postoperative day 3 status post VATS, mini-thoracotomy with left lower lobectomy for lung mass. EXAM: PORTABLE CHEST 1 VIEW COMPARISON:  10/02/2017 and earlier. FINDINGS: Portable AP semi upright view at 0710 hours. Stable left chest tube. Trace residual left pneumothorax only visible at the apex. Improved left lung volume and  decreased opacity at the left lung base. Mediastinal contours are within normal limits. Visualized tracheal air column is within normal limits. Negative right lung. Negative visible bowel gas pattern. IMPRESSION: 1. Decreasing left pneumothorax, now trace. Stable left chest tube. Improved left lung base ventilation. 2. No new cardiopulmonary abnormality. Electronically Signed   By: Genevie Ann M.D.   On: 10/03/2017 08:53    FINAL DIAGNOSIS Diagnosis 1. Lung, resection (segmental or lobe), Left Lower Lobe - SMALL CELL CARCINOMA, 3.5 CM -  MARGINS UNINVOLVED BY CARCINOMA - EMPHYSEMATOUS CHANGES - SEE ONCOLOGY TABLE AND COMMENT BELOW 2. Bronchial margin(s), resection, New - NO CARCINOMA IDENTIFIED 3. Lymph node, biopsy, 8 - NO CARCINOMA IDENTIFIED IN ONE LYMPH NODE (0/1) - SEE COMMENT 4. Lymph node, biopsy, 11 - NO CARCINOMA IDENTIFIED IN ONE LYMPH NODE (0/1) - SEE COMMENT 5. Lymph node, biopsy, 12L - NO CARCINOMA IDENTIFIED IN ONE LYMPH NODE (0/1) - SEE COMMENT 6. Lymph node, biopsy, 10 - NO CARCINOMA IDENTIFIED IN ONE LYMPH NODE (0/1) - SEE COMMENT 7. Lymph node, biopsy, 11 #2 - NO CARCINOMA IDENTIFIED IN ONE LYMPH NODE (0/1) - SEE COMMENT 8. Lymph node, biopsy, 4L - NO CARCINOMA IDENTIFIED IN ONE LYMPH NODE (0/1) - SEE COMMENT 9. Lymph node, biopsy, 10 #2 - NO CARCINOMA IDENTIFIED IN ONE LYMPH NODE (0/1) - SEE COMMENT 1 of 3 FINAL for NELIAH, CUYLER ([id number]) Microscopic Comment 1. LUNG: Procedure: Lobectomy Specimen Laterality: Left Tumor Site: Lower lobe Tumor Size: 3.5 cm Tumor Focality: Unifocal Histologic Type: Small cell carcinoma Visceral Pleura Invasion: Not identified Lymphovascular Invasion: Not identified Direct Invasion of Adjacent Structures: Tumor invades the bronchus Margins: Uninvolved by carcinoma Treatment effect: No known presurgical therapy 1080Regional Lymph Nodes: Number of Lymph Nodes Involved: 0 Number of Lymph Nodes Examined: 7 Pathologic Stage Classification (pTNM, AJCC 8th Edition): pT2a pN0 Representative Tumor Block: 1D Comment(s): The tumor is positive for cytokeratin AE1/3 and synaptophysin but negative for chromogranin. Dr. Vic Ripper reviewed the case and agrees with the above diagnosis. 3. 4, and 5. Cytokeratin AE1/3 was performed to exclude nodal metastasis. 6. -9. Cytokeratin AE1/3 is pending to exclude nodal metastasis. Thressa Sheller MD Pathologist, Electronic Signature (Case signed 10/03/2017) Intraoperative Diagnosis 1. LEFT LOWER LOBE, FROZEN SECTION  DIAGNOSIS (THREE BLOCKS) - SMALL CELL CARCINOMA. VASCULAR MARGIN IS NEGATIVE. BRONCHIAL MARGIN SHOWS DETACHED FRAGMENTS OF TUMOR IN THE BRONCHIAL LUMEN. TUMOR GROSSLY ABUTS THE BRONCHIAL MARGIN. (NK) 2. NEW BRONCHIAL MARGIN, FROZEN SECTION DIAGNOSIS - NEGATIVE FOR CARCINOMA. (NK) Specimen Gross and Clinical Information Specimen(s) Obtained: 1. Lung, resection (segmental or lobe), Left Lower Lobe 2. Bronchial margin(s), resection, New 3. Lymph node, biopsy, 8 4. Lymph node, biopsy, 11 5. Lymph node, biopsy, 12L 6. Lymph node, biopsy, 10 7. Lymph node, biopsy, 11 #2 8. Lymph node, biopsy, 4L 9. Lymph node, biopsy, 10 #2 Specimen Clinical Information 1. LLL lung mass (nt) 2 of 3 FINAL for STEPHENI, CAMERON ([id number]) Gross 1. Received fresh for rapid intraoperative consult is a 15.5 x 10.2 x 4.6 cm, 196-gram lobe of lung, clinically left lower lobe. The pleura is smooth, tan-red to anthracotic. The cut surface shows a 3.5 x 3.2 x 1.8 cm firm tan nodular mass. The mass is located at the hilum and involves the lumen of the bronchus with intraluminal tumor abutting the stapled bronchial margin of resection. The vascular margins grossly appear negative. The remainder of the parenchyma is spongy tan-red. At the hilum there is a 0.7 cm fragment of anthracotic lymph node. Sections  are submitted for frozen section in three blocks. Sections are submitted in a total of eight cassettes. A = frozen section of bronchial margin. B = frozen section of vascular margins. C = frozen section of tumor. D, E, F = tumor. G = uninvolved parenchyma. H = lymph node. 2. Received fresh for rapid intraoperative consult is a 1.5 x 1.2 x 0.5 cm portion of bronchus with staple lines present at the ends. The specimen is oriented with black suture at the new bronchial margin and blue ink on the old margin. A section parallel to the staples at the new margin is submitted for frozen section in one block. 3. Received  fresh are 1.0 x 0.8 x 0.3 cm of soft tan-yellow to anthracotic tissue. The specimen is submitted in toto. 4. Received fresh are 0.6 x 0.6 x 0.2 cm of soft to anthracotic tissue. The specimen is submitted in toto. 5. Received fresh are 0.4 x 0.4 x 0.2 cm of soft tan tissue. The specimen is submitted in toto. 6. Received fresh are 0.7 x 0.5 x 0.2 cm of soft tan tissue. The specimen is submitted in toto. 7. Received fresh are 0.6 x 0.5 x 0.2 cm of soft to anthracotic tissue. The specimen is submitted in toto. 8. Received fresh are 0.6 x 0.3 x 0.2 cm of soft tan tissue. The specimen is submitted in toto. 9. Received fresh are 0.5 x 0.5 x 0.3 cm of soft to anthracotic tissue. The specimen is submitted in toto. (GRP:ecj 09/30/2017) Stain(s) used in Diagnosis: The following stain(s) were used in diagnosing the case: Chromogranin A, Synaptophysin, CK AE1AE3. The control(s) stained appropriately. Disclaimer Some of these immunohistochemical stains may have been developed and the performance characteristics determined by [facility]. Some may not have been cleared or approved by the U.S. Food and Drug Administration. The FDA has determined that such clearance or approval is not necessary. This test is used for clinical purposes. It should not be regarded as investigational or for research. This laboratory is certified under the [place] (CLIA-88) as qualified to perform high complexity clinical laboratory testing. Report signed out from the following location(s) Technical Component was performed at [facility] [address], [place], [place] [address]. CLIA #: [id number], Interpretation was performed at [facility] [place], [place], [place] [address]. CLIA #: [id number], 3 of  Assessment/Plan: S/P Procedure(s) (LRB): VIDEO BRONCHOSCOPY (N/A) VIDEO ASSISTED THORACOSCOPY (VATS)/LUNG RESECTION (Left) NODE  DISSECTION (Left) Plan for discharge: see discharge orders CXR is stable Path noted above No new labs  LOS: 4 days    Brandi Crosby 10/04/2017

## 2017-10-04 NOTE — Plan of Care (Signed)
Pt. Received orders for discharge. Received all discharge paperwork and instructions verbalized understanding. All personal belongings with pt.

## 2017-10-04 NOTE — Plan of Care (Signed)
  Problem: Clinical Measurements: Goal: Ability to maintain clinical measurements within normal limits will improve Outcome: Progressing Goal: Will remain free from infection Outcome: Progressing Goal: Diagnostic test results will improve Outcome: Progressing Goal: Respiratory complications will improve Outcome: Progressing Goal: Cardiovascular complication will be avoided Outcome: Progressing   Problem: Activity: Goal: Risk for activity intolerance will decrease Outcome: Progressing   Problem: Nutrition: Goal: Adequate nutrition will be maintained Outcome: Progressing   Problem: Coping: Goal: Level of anxiety will decrease Outcome: Progressing   Problem: Elimination: Goal: Will not experience complications related to bowel motility Outcome: Progressing   Problem: Safety: Goal: Ability to remain free from injury will improve Outcome: Progressing   Problem: Cardiac: Goal: Hemodynamic stability will improve Outcome: Progressing   Problem: Clinical Measurements: Goal: Postoperative complications will be avoided or minimized Outcome: Progressing   Problem: Respiratory: Goal: Respiratory status will improve Outcome: Progressing   Problem: Pain Management: Goal: Pain level will decrease Outcome: Progressing   Problem: Skin Integrity: Goal: Wound healing without signs and symptoms infection will improve Outcome: Progressing

## 2017-10-04 NOTE — Discharge Instructions (Signed)
1. You may shower, please wash incisions daily with soap and water and keep dry.  If you wish to cover wounds with dressing you may do so but please keep clean and change daily.  No tub baths or swimming until incisions have completely healed.  If your incisions become red or develop any drainage please call our office at [phone]  2. No Driving until cleared by Dr. Everrett Coombe office and you are no longer using narcotic pain medications  3. Monitor your weight daily.. Please use the same scale and weigh at same time... If you gain 5-10 lbs in 48 hours with associated lower extremity swelling, please contact our office at [phone]  4. Fever of 101.5 for at least 24 hours with no source, please contact our office at [phone]  5. Activity- up as tolerated, please walk at least 3 times per day.  Avoid strenuous activity, no lifting, pushing, or pulling with your arms over 8-10 lbs   6. If any questions or concerns arise, please do not hesitate to contact our office at [phone]   Chest Tube Insertion, Adult A chest tube is a thin, flexible tube that is inserted into the space between your lung and your chest wall. You may need a chest tube if you have a collapsed lung from illness or a severe injury. A collapsed lung can be caused by:  An air leak (pneumothorax).  Blood collection (hemothorax).  Fluid buildup from an infection (empyema).  The chest tube drains the fluid or air from your lung. It may be attached to a suction device to help with drainage. You will need to stay in the hospital while the chest tube is in place. Tell a health care provider about:  Any allergies you have.  All medicines you are taking, including vitamins, herbs, eye drops, creams, and over-the-counter medicines.  Any problems you or family members have had with anesthetic medicines.  Any blood disorders you have.  Any surgeries you have had.  Any medical conditions you have, including any  recent fever or cold symptoms.  Whether you are pregnant or may be pregnant. What are the risks? Generally, this is a safe procedure. However, problems may occur, including:  Bleeding.  Infection.  Allergic reaction to medicines.  Lung damage.  Damage to the blood vessels or nerves near the lung.  Failure of the chest tube to work properly.  What happens before the procedure? Staying hydrated Follow instructions from your health care provider about hydration, which may include:  Up to 2 hours before the procedure - you may continue to drink clear liquids, such as water, clear fruit juice, black coffee, and plain tea.  Eating and drinking restrictions Follow instructions from your health care provider about eating and drinking, which may include:  8 hours before the procedure - stop eating heavy meals or foods such as meat, fried foods, or fatty foods.  6 hours before the procedure - stop eating light meals or foods, such as toast or cereal.  6 hours before the procedure - stop drinking milk or drinks that contain milk.  2 hours before the procedure - stop drinking clear liquids.  Medicines  Ask your health care provider about: ? Changing or stopping your regular medicines. This is especially important if you are taking diabetes medicines or blood thinners. ? Taking medicines such as aspirin and ibuprofen. These medicines can thin your blood. Do not take these medicines before your procedure if your health care provider instructs you not  to. General instructions  You will have a chest X-ray or other imaging studies of the lung.  Plan to have someone take you home from the hospital or clinic.  If you will be going home right after the procedure, plan to have someone with you for 24 hours. What happens during the procedure?  To reduce your risk of infection: ? Your health care team will wash or sanitize their hands. ? Your skin will be washed with soap. ? Hair may be  removed from the surgical area.  An IV tube will be inserted into one of your veins.  You will be given one or more of the following: ? A medicine to help you relax (sedative). ? A medicine to numb the area (local anesthetic).  You may be given antibiotic and pain medicines through the IV tube.  The surgeon will make a small incision in a space between your ribs.  The chest tube will be placed through the incision into the space between the lung and your chest wall.  Stitches (sutures) will be used to close the incision around the tube.  The chest tube may be attached to a suction device.  The incision site will be covered with an airtight bandage (dressing).  Another chest X-ray will be done to check the position of the tube. The procedure may vary among health care providers and hospitals. What happens after the procedure?  Your blood pressure, heart rate, breathing rate, and blood oxygen level will be monitored until the medicines you were given have worn off.  You may continue to get pain medicine or antibiotics through the IV tube.  The tube and dressing will be checked regularly.  You will be encouraged to cough and take deep breaths.  You may be given oxygen to breathe.  Chest X-rays will be done to find out if the lung is inflating. After the lung is inflated: ? Another chest X-ray may be done. ? The chest tube can be removed after the lung remains inflated and you are breathing easily. ? A new dressing will be put on.  Do not drive for 24 hours if you were given a sedative. This information is not intended to replace advice given to you by your health care provider. Make sure you discuss any questions you have with your health care provider. Document Released: 09/18/2006 Document Revised: 12/29/2015 Document Reviewed: 11/22/2015 Elsevier Interactive Patient Education  2018 [employer].   Chest Tube Insertion, Adult A chest tube is a thin, flexible tube that is  inserted into the space between your lung and your chest wall. You may need a chest tube if you have a collapsed lung from illness or a severe injury. A collapsed lung can be caused by:  An air leak (pneumothorax).  Blood collection (hemothorax).  Fluid buildup from an infection (empyema).  The chest tube drains the fluid or air from your lung. It may be attached to a suction device to help with drainage. You will need to stay in the hospital while the chest tube is in place. Tell a health care provider about:  Any allergies you have.  All medicines you are taking, including vitamins, herbs, eye drops, creams, and over-the-counter medicines.  Any problems you or family members have had with anesthetic medicines.  Any blood disorders you have.  Any surgeries you have had.  Any medical conditions you have, including any recent fever or cold symptoms.  Whether you are pregnant or may be pregnant. What  are the risks? Generally, this is a safe procedure. However, problems may occur, including:  Bleeding.  Infection.  Allergic reaction to medicines.  Lung damage.  Damage to the blood vessels or nerves near the lung.  Failure of the chest tube to work properly.  What happens before the procedure? Staying hydrated Follow instructions from your health care provider about hydration, which may include:  Up to 2 hours before the procedure - you may continue to drink clear liquids, such as water, clear fruit juice, black coffee, and plain tea.  Eating and drinking restrictions Follow instructions from your health care provider about eating and drinking, which may include:  8 hours before the procedure - stop eating heavy meals or foods such as meat, fried foods, or fatty foods.  6 hours before the procedure - stop eating light meals or foods, such as toast or cereal.  6 hours before the procedure - stop drinking milk or drinks that contain milk.  2 hours before the procedure  - stop drinking clear liquids.  Medicines  Ask your health care provider about: ? Changing or stopping your regular medicines. This is especially important if you are taking diabetes medicines or blood thinners. ? Taking medicines such as aspirin and ibuprofen. These medicines can thin your blood. Do not take these medicines before your procedure if your health care provider instructs you not to. General instructions  You will have a chest X-ray or other imaging studies of the lung.  Plan to have someone take you home from the hospital or clinic.  If you will be going home right after the procedure, plan to have someone with you for 24 hours. What happens during the procedure?  To reduce your risk of infection: ? Your health care team will wash or sanitize their hands. ? Your skin will be washed with soap. ? Hair may be removed from the surgical area.  An IV tube will be inserted into one of your veins.  You will be given one or more of the following: ? A medicine to help you relax (sedative). ? A medicine to numb the area (local anesthetic).  You may be given antibiotic and pain medicines through the IV tube.  The surgeon will make a small incision in a space between your ribs.  The chest tube will be placed through the incision into the space between the lung and your chest wall.  Stitches (sutures) will be used to close the incision around the tube.  The chest tube may be attached to a suction device.  The incision site will be covered with an airtight bandage (dressing).  Another chest X-ray will be done to check the position of the tube. The procedure may vary among health care providers and hospitals. What happens after the procedure?  Your blood pressure, heart rate, breathing rate, and blood oxygen level will be monitored until the medicines you were given have worn off.  You may continue to get pain medicine or antibiotics through the IV tube.  The tube and  dressing will be checked regularly.  You will be encouraged to cough and take deep breaths.  You may be given oxygen to breathe.  Chest X-rays will be done to find out if the lung is inflating. After the lung is inflated: ? Another chest X-ray may be done. ? The chest tube can be removed after the lung remains inflated and you are breathing easily. ? A new dressing will be put on.  Do not  drive for 24 hours if you were given a sedative. This information is not intended to replace advice given to you by your health care provider. Make sure you discuss any questions you have with your health care provider. Document Released: 09/18/2006 Document Revised: 12/29/2015 Document Reviewed: 11/22/2015 Elsevier Interactive Patient Education  2018 [employer]. Discharge Instructions: 1. You may shower, please wash incisions daily with soap and water and keep dry.  If you wish to cover wounds with dressing you may do so but please keep clean and change daily.  No tub baths or swimming until incisions have completely healed.  If your incisions become red or develop any drainage please call our office at [phone]  2. No Driving until cleared by Dr. Everrett Coombe office and you are no longer using narcotic pain medications  3. Monitor your weight daily.. Please use the same scale and weigh at same time... If you gain 5-10 lbs in 48 hours with associated lower extremity swelling, please contact our office at [phone]  4. Fever of 101.5 for at least 24 hours with no source, please contact our office at [phone]  5. Activity- up as tolerated, please walk at least 3 times per day.  Avoid strenuous activity, no lifting, pushing, or pulling with your arms over 8-10 lbs   6. If any questions or concerns arise, please do not hesitate to contact our office at [phone]

## 2017-10-05 NOTE — Op Note (Deleted)
  The note originally documented on this encounter has been moved the the encounter in which it belongs.  

## 2017-10-05 NOTE — Op Note (Signed)
NAME:  Brandi Crosby, GRIFFING NO.:  [id number]  MEDICAL RECORD NO.:  [id number]  LOCATION:                                 FACILITY:  PHYSICIAN:  Lanelle Bal, MD    DATE OF BIRTH:  [birth year]  DATE OF PROCEDURE:  09/30/2017 DATE OF DISCHARGE:                              OPERATIVE REPORT   PREOPERATIVE DIAGNOSIS:  Left lower lobe lung mass.  POSTOPERATIVE DIAGNOSIS:  Left lower lobe lung malignancy, small cell carcinoma.  PROCEDURE PERFORMED:  Video bronchoscopy with brushings, left video- assisted thoracoscopy, mini thoracotomy, left lower lobectomy with lymph node dissection and placement of On-Q device.  SURGEON:  Lanelle Bal, MD  FIRST ASSISTANT:  Ellwood Handler, PA  BRIEF HISTORY:  The patient is a 62 year old female with previous history of smoking who presented with cough and also allergic reaction to p.o. antibiotic.  This led to visit with her primary care physician and they did chest x-ray, which revealed a left lower lobe lung mass. Preoperative evaluation included a CT scan of the chest and PET scan. The PET scan suggested hypermetabolic left lower lobe lung mass without nodal involvement, highly suggestive of lung malignancy with this highly suspicious radiographic findings for what appeared to be stage I lung cancer, proceeding with primary resection was suggested to the patient who agreed and signed informed consent.  DESCRIPTION OF PROCEDURE:  With central line and arterial line in place, the patient underwent general endotracheal anesthesia with a single- lumen endotracheal tube.  The appropriate time-out was performed and we proceeded with video bronchoscopy to the subsegmental level in the right and left tracheobronchial tree.  No definitive endobronchial lesions were appreciated.  We then proceeded with brushings into the left lower lobe bronchus.  These brushings on quick smear were interpreted by Pathology as consistent with  malignancy, likely small cell carcinoma. With this information, we then decided to proceed with planned resection as the patient had a double-lumen endotracheal tube placed and was turned in the lateral decubitus position with the left side up.  Two port sites were created in the left chest and this utilitarian incision, the left lung was deflated.  The fissure was fairly well demarcated.  On the preop CT, there was suggestion that the mass may involve the upper lobe as it was abutting the fissure, but as we proceeded with resection, there was no involvement of the upper lobe.  We proceeded with dividing the inferior pulmonary ligament and isolating the inferior pulmonary vein, which was encircled easily and the vascular stapler was used to divide the inferior pulmonary vein.  We then dissected along the left lower lobe pulmonary artery branch identifying the major branch and the branch to the superior segment of the left lower lobe.  A vascular stapler was used to divide these branches.  We then continued to dissect the remaining fissure and identified the left lower lobe bronchus.  This was then divided with a green load stapler.  Prior to firing the stapler, the left lung was inflated and there was good inflation of the upper lobe.  We then divided the bronchus, placed the specimen in a large specimen bag and brought it out through  the incision.  This specimen was submitted to Pathology for frozen section and confirmation of margins.  We then proceeded with dissecting out lymph nodes from 4, 10, 11, and 8 stations and submitting in separate identified specimen cups of dissected lymph nodes.  The frozen section on the mass confirmed small cell carcinoma in the mass.  There was a question of involvement of the bronchus as there was some crush artifact, but since this be clear margin could not be absolutely confirmed, we decided to take an additional segment of bronchus.  We dissected  along the bronchus a little further and re-stapled edge and submitted a new bronchial margin, appropriately marked for orientation.  This margin was reported as negative.  We tunneled the On-Q catheter subpleurally.  The Blake drain and standard 28 Argyle chest tube were then placed through the port sites.  Incision was closed in the muscle layer with interrupted 0 Vicryl, running 3-0 Vicryl in subcutaneous tissue, and a 3-0 subcuticular stitch in skin edges.  Chest tubes were secured in place. At the completion of procedure, the lung was re-inflated.  There was no obvious air leak from the chest tube.  Sponge and needle count was reported as correct at the completion of the procedure.  The patient tolerated the procedure without obvious complication.  She was extubated in the operating room and transferred to the recovery room for further postoperative care.  Estimated blood loss was less than 100 mL.     Lanelle Bal, MD     EG/MEDQ  D:  10/05/2017  T:  10/05/2017  Job:  [id number]

## 2017-10-09 ENCOUNTER — Telehealth: Payer: Self-pay | Admitting: Internal Medicine

## 2017-10-09 ENCOUNTER — Encounter: Payer: Self-pay | Admitting: *Deleted

## 2017-10-09 DIAGNOSIS — C3490 Malignant neoplasm of unspecified part of unspecified bronchus or lung: Secondary | ICD-10-CM

## 2017-10-09 NOTE — Telephone Encounter (Signed)
Spoke with patient regarding add on appt for 4/19 per Hinton Dyer

## 2017-10-10 ENCOUNTER — Inpatient Hospital Stay: Payer: 59 | Attending: Internal Medicine | Admitting: Internal Medicine

## 2017-10-10 ENCOUNTER — Telehealth: Payer: Self-pay | Admitting: Internal Medicine

## 2017-10-10 ENCOUNTER — Encounter: Payer: Self-pay | Admitting: Internal Medicine

## 2017-10-10 ENCOUNTER — Inpatient Hospital Stay: Payer: 59

## 2017-10-10 VITALS — BP 117/63 | HR 76 | Temp 98.7°F | Resp 18 | Ht 65.0 in | Wt 127.2 lb

## 2017-10-10 DIAGNOSIS — C3490 Malignant neoplasm of unspecified part of unspecified bronchus or lung: Secondary | ICD-10-CM

## 2017-10-10 DIAGNOSIS — Z87891 Personal history of nicotine dependence: Secondary | ICD-10-CM | POA: Insufficient documentation

## 2017-10-10 DIAGNOSIS — Z716 Tobacco abuse counseling: Secondary | ICD-10-CM | POA: Insufficient documentation

## 2017-10-10 DIAGNOSIS — Z808 Family history of malignant neoplasm of other organs or systems: Secondary | ICD-10-CM | POA: Insufficient documentation

## 2017-10-10 DIAGNOSIS — K118 Other diseases of salivary glands: Secondary | ICD-10-CM

## 2017-10-10 DIAGNOSIS — Z7189 Other specified counseling: Secondary | ICD-10-CM

## 2017-10-10 DIAGNOSIS — C3432 Malignant neoplasm of lower lobe, left bronchus or lung: Secondary | ICD-10-CM | POA: Diagnosis not present

## 2017-10-10 DIAGNOSIS — Z5111 Encounter for antineoplastic chemotherapy: Secondary | ICD-10-CM

## 2017-10-10 LAB — CBC WITH DIFFERENTIAL (CANCER CENTER ONLY)
Basophils Absolute: 0 10*3/uL (ref 0.0–0.1)
Basophils Relative: 0 %
Eosinophils Absolute: 0.1 10*3/uL (ref 0.0–0.5)
Eosinophils Relative: 2 %
HCT: 29.5 % — ABNORMAL LOW (ref 34.8–46.6)
Hemoglobin: 10 g/dL — ABNORMAL LOW (ref 11.6–15.9)
Lymphocytes Relative: 22 %
Lymphs Abs: 1.5 10*3/uL (ref 0.9–3.3)
MCH: 32.6 pg (ref 25.1–34.0)
MCHC: 33.9 g/dL (ref 31.5–36.0)
MCV: 96.1 fL (ref 79.5–101.0)
Monocytes Absolute: 0.7 10*3/uL (ref 0.1–0.9)
Monocytes Relative: 10 %
Neutro Abs: 4.7 10*3/uL (ref 1.5–6.5)
Neutrophils Relative %: 66 %
Platelet Count: 324 10*3/uL (ref 145–400)
RBC: 3.07 MIL/uL — ABNORMAL LOW (ref 3.70–5.45)
RDW: 12.4 % (ref 11.2–14.5)
WBC Count: 7.1 10*3/uL (ref 3.9–10.3)

## 2017-10-10 LAB — CMP (CANCER CENTER ONLY)
ALT: 27 U/L (ref 0–55)
AST: 16 U/L (ref 5–34)
Albumin: 3.4 g/dL — ABNORMAL LOW (ref 3.5–5.0)
Alkaline Phosphatase: 62 U/L (ref 40–150)
Anion gap: 7 (ref 3–11)
BUN: 14 mg/dL (ref 7–26)
CO2: 29 mmol/L (ref 22–29)
Calcium: 9.7 mg/dL (ref 8.4–10.4)
Chloride: 102 mmol/L (ref 98–109)
Creatinine: 0.82 mg/dL (ref 0.60–1.10)
GFR, Est AFR Am: >60 mL/min (ref >60)
GFR, Estimated: >60 mL/min (ref >60)
Glucose, Bld: 120 mg/dL (ref 70–140)
Potassium: 4.6 mmol/L (ref 3.5–5.1)
Sodium: 138 mmol/L (ref 136–145)
Total Bilirubin: 0.4 mg/dL (ref 0.2–1.2)
Total Protein: 6.5 g/dL (ref 6.4–8.3)

## 2017-10-10 NOTE — Telephone Encounter (Signed)
Per 4/19 los. MRI of the brain next week. No follow-up visit now until the patient makes a decision.

## 2017-10-10 NOTE — Progress Notes (Signed)
[facility] [phone]   Fax:([phone]  CONSULT NOTE  REFERRING PHYSICIAN: Dr. Lanelle Bal  REASON FOR CONSULTATION:  62 years old white female recently diagnosed with lung cancer.  HPI JOURY ALLCORN is a 62 y.o. female with past medical history significant for anxiety, dyslipidemia, history of shingles as well as long history for smoking.  The patient mentions that she was complaining of ptosis and myosis pain in February 2019.  She was seen by her dentist and x-ray showed some evidence of sinus infection.  She was referred to her primary care physician and was started on treatment with Augmentin but this was complicated with nausea and vomiting as well as wheezes.  Chest x-ray was performed for evaluation of the wheezes and that showed suspicious mass in the left lung.  This was followed by CT scan of the chest on August 22, 2017 and that showed 3.5 cm mass anteriorly within the midportion of the left lower lobe with a broad service along the major fissure.  Malignancy is the primary concern.  There was no sign of metastatic adenopathy.  The patient was referred to Dr. Servando Snare and the PET scan was performed on September 18, 2017 and that showed 2.7 cm left lower lobe pulmonary lesions that is hypermetabolic with SUV max of 4.40 and consistent with a primary lung neoplasm.  No enlarged or hypermetabolic mediastinal or hilar lymph nodes.  There was 1.85 x 1.35 cm solid lesion in the posterior inferior aspect of the right parotid gland which was hypermetabolic with SUV max of 3.47.  This is worrisome for primary parotid gland neoplasm.  There was no other evidence of distant metastatic disease in the abdomen or pelvis. On 09/30/2017 the patient underwent video bronchoscopy was brushing, left VATS with left lower lobectomy and lymph node dissection under the care of Dr. Servando Snare. The final pathology ([id number]) showed small cell carcinoma measuring 0.5 cm with  negative margin and negative dissected lymph nodes.  There was no evidence for visceral pleural or lymphovascular invasion. The patient was referred to me today for evaluation and recommendation regarding adjuvant therapy. When seen today she is feeling fine except for lightheadedness.  She denied having any chest pain, shortness of breath but continues to have mild cough with no hemoptysis.  She denied having any recent weight loss or night sweats.  She has no nausea, vomiting, diarrhea or constipation. Family history significant for mother with melanoma, father had Alzheimer and sister had heart disease. The patient is married and has 1 son.  She was accompanied today by her husband Josph Macho.  She works as a Forensic psychologist.  The patient has a history for smoking less than 1 pack/day for around 20 years and quit 09/28/2017.  She drinks 2 to 3 glasses of wine every day.  She has no history of drug abuse.  HPI  Past Medical History:  Diagnosis Date  . Allergy   . Anxiety   . Depression   . Hyperlipemia   . Lung mass   . Shingles     Past Surgical History:  Procedure Laterality Date  . DENTAL SURGERY  2010   implant  . NODE DISSECTION Left 09/30/2017   Procedure: NODE DISSECTION;  Surgeon: Grace Isaac, MD;  Location: [place] [facility];  Service: Thoracic;  Laterality: Left;  . RIGHT OOPHORECTOMY    . TONSILLECTOMY AND ADENOIDECTOMY    . VIDEO ASSISTED THORACOSCOPY (VATS)/WEDGE RESECTION Left 09/30/2017  Procedure: VIDEO ASSISTED THORACOSCOPY (VATS)/LUNG RESECTION;  Surgeon: Grace Isaac, MD;  Location: [facility];  Service: Thoracic;  Laterality: Left;  [name] VIDEO BRONCHOSCOPY N/A 09/30/2017   Procedure: VIDEO BRONCHOSCOPY;  Surgeon: Grace Isaac, MD;  Location: [facility];  Service: Thoracic;  Laterality: N/A;  . WISDOM TOOTH EXTRACTION      Family History  Problem Relation Age of Onset  . Melanoma Mother   . Early death Mother   . Alzheimer's disease Father   . Hyperlipidemia Father   .  Heart disease Sister     Social History Social History   Tobacco Use  . Smoking status: Former Smoker    Packs/day: 0.25    Years: 15.00    Pack years: 3.75    Types: Cigarettes    Last attempt to quit: 09/28/2017    Years since quitting: 0.0  . Smokeless tobacco: Never Used  Substance Use Topics  . Alcohol use: Yes    Alcohol/week: 8.4 oz    Types: 14 Glasses of wine per week  . Drug use: No    Allergies  Allergen Reactions  . Augmentin [Amoxicillin-Pot Clavulanate] Diarrhea and Nausea And Vomiting  . Penicillins Nausea And Vomiting and Other (See Comments)    Has patient had a PCN reaction causing immediate rash, facial/tongue/throat swelling, SOB or lightheadedness with hypotension: Yes Has patient had a PCN reaction causing severe rash involving mucus membranes or skin necrosis: No Has patient had a PCN reaction that required hospitalization: No Has patient had a PCN reaction occurring within the last 10 years: Yes If all of the above answers are "NO", then may proceed with Cephalosporin use.     Current Outpatient Medications  Medication Sig Dispense Refill  . ALPRAZolam (XANAX) 0.5 MG tablet Take 0.5 mg by mouth at bedtime as needed for anxiety or sleep.     . Ascorbic Acid (VITAMIN C) 1000 MG tablet Take 1,000 mg by mouth daily.      . cholecalciferol (VITAMIN D) 1000 units tablet Take 1,000 Units by mouth daily.    [name] escitalopram (LEXAPRO) 10 MG tablet Take 10 mg by mouth daily.     [name] ibuprofen (ADVIL,MOTRIN) 200 MG tablet Take 400 mg by mouth every 6 (six) hours as needed for headache or moderate pain.     [name] oxyCODONE (OXY IR/ROXICODONE) 5 MG immediate release tablet Take 1-2 tablets (5-10 mg total) by mouth every 6 (six) hours as needed for up to 7 days for severe pain. 30 tablet 0   No current facility-administered medications for this visit.     Review of Systems  Constitutional: negative Eyes: negative Ears, nose, mouth, throat, and face:  negative Respiratory: positive for cough Cardiovascular: negative Gastrointestinal: negative Genitourinary:negative Integument/breast: negative Hematologic/lymphatic: negative Musculoskeletal:negative Neurological: negative Behavioral/Psych: negative Endocrine: negative Allergic/Immunologic: negative  Physical Exam  IRJ:JOACZ, healthy, no distress, well nourished, well developed and anxious SKIN: skin color, texture, turgor are normal, no rashes or significant lesions HEAD: Normocephalic, No masses, lesions, tenderness or abnormalities EYES: normal, PERRLA, Conjunctiva are pink and non-injected EARS: External ears normal, Canals clear OROPHARYNX:no exudate, no erythema and lips, buccal mucosa, and tongue normal  NECK: supple, no adenopathy, no JVD LYMPH:  no palpable lymphadenopathy, no hepatosplenomegaly BREAST:not examined LUNGS: clear to auscultation , and palpation HEART: regular rate & rhythm, no murmurs and no gallops ABDOMEN:abdomen soft, non-tender and normal bowel sounds BACK: Back symmetric, no curvature., No CVA tenderness EXTREMITIES:no joint deformities, effusion, or inflammation, no edema  NEURO:  alert & oriented x 3 with fluent speech, no focal motor/sensory deficits  PERFORMANCE STATUS: ECOG 1  LABORATORY DATA: Lab Results  Component Value Date   WBC 7.1 10/10/2017   HGB 9.2 (L) 10/03/2017   HCT 29.5 (L) 10/10/2017   MCV 96.1 10/10/2017   PLT 324 10/10/2017      Chemistry      Component Value Date/Time   NA 138 10/10/2017 0954   K 4.6 10/10/2017 0954   CL 102 10/10/2017 0954   CO2 29 10/10/2017 0954   BUN 14 10/10/2017 0954   CREATININE 0.82 10/10/2017 0954      Component Value Date/Time   CALCIUM 9.7 10/10/2017 0954   ALKPHOS 62 10/10/2017 0954   AST 16 10/10/2017 0954   ALT 27 10/10/2017 0954   BILITOT 0.4 10/10/2017 0954       RADIOGRAPHIC STUDIES: Dg Chest 2 View  Result Date: 10/04/2017 CLINICAL DATA:  Chest tube removal. EXAM:  CHEST - 2 VIEW COMPARISON:  Chest x-ray dated 10/03/2017. FINDINGS: LEFT-sided chest tube has been removed. Tiny residual pneumothorax at the LEFT lung apex, stable compared to yesterday's exam. Probable mild atelectasis at the LEFT lung base. Lungs otherwise clear. Heart size and mediastinal contours are stable. No acute or suspicious osseous finding. IMPRESSION: Status post LEFT-sided chest tube removal. Persistent tiny pneumothorax at the LEFT lung apex, stable. Probable mild atelectasis at the LEFT lung base. Electronically Signed   By: Franki Cabot M.D.   On: 10/04/2017 07:44   Dg Chest 2 View  Result Date: 09/30/2017 CLINICAL DATA:  Cavitating mass in the left lower lobe.  Preop. EXAM: CHEST - 2 VIEW COMPARISON:  Radiograph and CT 08/25/2017 FINDINGS: The left lower lobe pulmonary mass is best visualized on the lateral view and similar from prior radiographs. No acute airspace disease. Normal heart size and mediastinal contours. No pulmonary edema, pleural effusion or pneumothorax. Mild scoliotic curvature of the mid upper thoracic spine. IMPRESSION: Left lower lobe pulmonary mass grossly unchanged in radiographic appearance from prior exam. No acute abnormality. Electronically Signed   By: Jeb Levering M.D.   On: 09/30/2017 06:11   Nm Pet Image Initial (pi) Skull Base To Thigh  Result Date: 09/18/2017 CLINICAL DATA:  Initial treatment strategy for left lower lobe pulmonary lesion. EXAM: NUCLEAR MEDICINE PET SKULL BASE TO THIGH TECHNIQUE: 6.2 mCi F-18 FDG was injected intravenously. Full-ring PET imaging was performed from the skull base to thigh after the radiotracer. CT data was obtained and used for attenuation correction and anatomic localization. Fasting blood glucose: 99 mg/dl COMPARISON:  CT chest 08/26/2017 FINDINGS: Mediastinal blood pool activity: SUV max 2.02 NECK: There is a 18.5 x 13.5 mm solid lesion in the posterior inferior aspect of the right parotid gland which is hypermetabolic  with SUV max of 9.65. 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 No associated neck adenopathy. Recommend ENT consultation/evaluation and consideration for biopsy. Incidental CT findings: none CHEST: The 2.7 cm left lower lobe pulmonary lesion is hypermetabolic with SUV max of 7.02 and consistent with primary lung neoplasm. No enlarged or hypermetabolic mediastinal or hilar lymph nodes. No other pulmonary lesions are identified. Stable underlying emphysematous changes. No breast masses, supraclavicular or axillary adenopathy. Small scattered lymph nodes are noted. Incidental CT findings: None ABDOMEN/PELVIS: No abnormal hypermetabolic activity within the liver, pancreas, adrenal glands, or spleen. No hypermetabolic lymph nodes in the abdomen or pelvis. Incidental CT findings: none SKELETON: No focal hypermetabolic activity to suggest skeletal metastasis. Incidental  CT findings: none IMPRESSION: 1. 2.7 cm left lower lobe pulmonary lesion is hypermetabolic and consistent with primary lung neoplasm. No mediastinal or hilar adenopathy or evidence of metastatic disease. 2. 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 Recommend ENT consultation. Electronically Signed   By: Marijo Sanes M.D.   On: 09/18/2017 09:37   Dg Chest Port 1 View  Result Date: 10/03/2017 CLINICAL DATA:  62 year old female postoperative day 3 status post VATS, mini-thoracotomy with left lower lobectomy for lung mass. EXAM: PORTABLE CHEST 1 VIEW COMPARISON:  10/02/2017 and earlier. FINDINGS: Portable AP semi upright view at 0710 hours. Stable left chest tube. Trace residual left pneumothorax only visible at the apex. Improved left lung volume and decreased opacity at the left lung base. Mediastinal contours are within normal limits. Visualized tracheal air column is within normal limits. Negative right lung. Negative visible bowel gas pattern. IMPRESSION: 1. Decreasing left  pneumothorax, now trace. Stable left chest tube. Improved left lung base ventilation. 2. No new cardiopulmonary abnormality. Electronically Signed   By: Genevie Ann M.D.   On: 10/03/2017 08:53   Dg Chest Port 1 View  Result Date: 10/02/2017 CLINICAL DATA:  Status post lobectomy EXAM: PORTABLE CHEST 1 VIEW COMPARISON:  10/01/2017 FINDINGS: Postoperative changes on the left. Left basilar atelectasis. Small left basilar and apical pneumothorax, new since prior study. Heart is normal size. Right lung is clear. IMPRESSION: Small left apical and basilar pneumothorax with left chest tube remaining in place. Left base atelectasis. Electronically Signed   By: Rolm Baptise M.D.   On: 10/02/2017 09:06   Dg Chest Port 1 View  Result Date: 10/01/2017 CLINICAL DATA:  Status post left lobectomy EXAM: PORTABLE CHEST 1 VIEW COMPARISON:  09/30/2017 FINDINGS: Cardiac shadow is stable. Improved aeration in the right upper lobe is noted. Right jugular central line is been removed in the interval. Two left-sided chest tubes are seen without pneumothorax. No other focal abnormality is noted. IMPRESSION: Improved aeration in the right upper lobe. No pneumothorax on the left. Electronically Signed   By: Inez Catalina M.D.   On: 10/01/2017 08:17   Dg Chest Port 1 View  Result Date: 09/30/2017 CLINICAL DATA:  Status post VATS.  Left chest tubes. EXAM: PORTABLE CHEST 1 VIEW COMPARISON:  09/30/2017. FINDINGS: Right IJ line noted with tip over superior vena cava. Postsurgical changes left lung with left chest tubes noted. Mild left apical and left base pneumothorax may be present. Atelectasis right upper lung. Mild subsegmental atelectasis both lung bases. Cardiomegaly with normal pulmonary vascularity. IMPRESSION: 1. Postsurgical changes left lung with chest tubes noted. Mild left apical and left base pneumothorax . Right IJ line in good anatomic position. 2. Right upper lobe atelectasis. Mild subsegmental atelectasis both lung bases.  Critical Value/emergent results were called by telephone at the time of interpretation on 09/30/2017 at 1:32 pm to nurse Corey Skains, who verbally acknowledged these results. Electronically Signed   By: Marcello Moores  Register   On: 09/30/2017 13:35    ASSESSMENT: This is a very pleasant 62 years old white female recently diagnosed with limited stage (T2 a, N0, M0) of small cell lung cancer presented with left lower lobe lung mass status post left lower lobectomy with lymph node dissection on September 30, 2017.   PLAN: I had a lengthy discussion with the patient and her husband today about her current disease of stage, prognosis and treatment options. I recommended for the patient to complete the staging work-up by ordering  MRI of the brain to rule out brain metastases. I had a lengthy discussion with the patient about the role of adjuvant systemic chemotherapy for patient with small cell lung cancer.  I explained to the patient that small cell lung cancer is a systemic disease and the risk for metastasis is high.  I also explained to the patient that she received only local treatment for her treatment with no systemic therapy at this point. I recommended for the patient adjuvant systemic chemotherapy was 4 cycles of cisplatin 80 mg/M2 on day 1 and etoposide 100 mg/M2 on days 1, 2 and 3 every 3 weeks. I discussed with the patient the adverse effect of this treatment including but not limited to alopecia, myelosuppression, nausea and vomiting, peripheral neuropathy, liver or renal dysfunction. The patient may also benefit from prophylactic cranial irradiation after the adjuvant therapy. If she decided to proceed with adjuvant treatment we will consider her to start treatment within the next 3-4 weeks. The patient and her husband would like some time to think about that option and he may also seek a second opinion before making the final decision. I did not give the patient a follow-up appointment with me until she makes a  decision regarding her treatment.  If she decided against adjuvant systemic chemotherapy, I will see her back for follow-up visit in 3 months with a repeat CT scan of the chest for reevaluation of her disease. For the smoking cessation, I strongly encouraged the patient to continue quitting. The patient and her husband had several questions and answers them completely to their satisfaction. She was advised to call immediately if she has any concerning symptoms in the interval. The patient voices understanding of current disease status and treatment options and is in agreement with the current care plan.  All questions were answered. The patient knows to call the clinic with any problems, questions or concerns. We can certainly see the patient much sooner if necessary.  Thank you so much for allowing me to participate in the care of LILLIANA TURNER. I will continue to follow up the patient with you and assist in her care.  I spent 55 minutes counseling the patient face to face. The total time spent in the appointment was 80 minutes.  Disclaimer: This note was dictated with voice recognition software. Similar sounding words can inadvertently be transcribed and may not be corrected upon review.   Eilleen Kempf October 10, 2017, 10:59 AM

## 2017-10-11 DIAGNOSIS — C3432 Malignant neoplasm of lower lobe, left bronchus or lung: Secondary | ICD-10-CM | POA: Insufficient documentation

## 2017-10-11 DIAGNOSIS — Z7189 Other specified counseling: Secondary | ICD-10-CM | POA: Insufficient documentation

## 2017-10-11 DIAGNOSIS — Z716 Tobacco abuse counseling: Secondary | ICD-10-CM | POA: Insufficient documentation

## 2017-10-15 ENCOUNTER — Ambulatory Visit (HOSPITAL_COMMUNITY)
Admission: RE | Admit: 2017-10-15 | Discharge: 2017-10-15 | Disposition: A | Discharge status: Home / Self Care | Payer: 59 | Source: Ambulatory Visit | Attending: Internal Medicine | Admitting: Internal Medicine

## 2017-10-15 ENCOUNTER — Other Ambulatory Visit: Payer: Self-pay | Admitting: Cardiothoracic Surgery

## 2017-10-15 DIAGNOSIS — I639 Cerebral infarction, unspecified: Secondary | ICD-10-CM | POA: Diagnosis not present

## 2017-10-15 DIAGNOSIS — C3432 Malignant neoplasm of lower lobe, left bronchus or lung: Secondary | ICD-10-CM

## 2017-10-15 MED ORDER — GADOBENATE DIMEGLUMINE 529 MG/ML IV SOLN
15.0000 mL | Freq: Once | INTRAVENOUS | Status: AC | PRN
  Administered 2017-10-15: 12 mL via INTRAVENOUS

## 2017-10-16 ENCOUNTER — Ambulatory Visit
Admission: RE | Admit: 2017-10-16 | Discharge: 2017-10-16 | Disposition: A | Discharge status: Home / Self Care | Payer: 59 | Source: Ambulatory Visit | Attending: Cardiothoracic Surgery | Admitting: Cardiothoracic Surgery

## 2017-10-16 ENCOUNTER — Ambulatory Visit (INDEPENDENT_AMBULATORY_CARE_PROVIDER_SITE_OTHER): Payer: Self-pay | Admitting: Cardiothoracic Surgery

## 2017-10-16 VITALS — BP 128/73 | HR 81 | Resp 20 | Ht 65.0 in | Wt 126.0 lb

## 2017-10-16 DIAGNOSIS — C3432 Malignant neoplasm of lower lobe, left bronchus or lung: Secondary | ICD-10-CM

## 2017-10-16 DIAGNOSIS — Z902 Acquired absence of lung [part of]: Secondary | ICD-10-CM

## 2017-10-16 DIAGNOSIS — J90 Pleural effusion, not elsewhere classified: Secondary | ICD-10-CM

## 2017-10-16 NOTE — Progress Notes (Signed)
[address]       [place] [address]             [phone]                  Amori D Tinkey Elliott Medical Record #[id number] Date of Birth: [birth year]  Referring SW:NIOEVO, Faylene Million, PA-C Primary Cardiology: Primary Care:Meyers, Annie Main, MD  Chief Complaint:  Follow Up Visit 09/30/2017 OPERATIVE REPORT PREOPERATIVE DIAGNOSIS:  Left lower lobe lung mass. POSTOPERATIVE DIAGNOSIS:  Left lower lobe lung malignancy, small cell carcinoma. PROCEDURE PERFORMED:  Video bronchoscopy with brushings, left video- assisted thoracoscopy, mini thoracotomy, left lower lobectomy with lymph node dissection and placement of On-Q device. 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  The patient is making good progress postoperatively she plans to return to work some next week.  She denies any significant shortness of breath.  Is walking several miles a day without difficulty      Zubrod Score: At the time of surgery this patient's most appropriate activity status/level should be described as: []     0    Normal activity, no symptoms []     1    Restricted in physical strenuous activity but ambulatory, able to do out light work []     2    Ambulatory and capable of self care, unable to do work activities, up and about                 >50 % of waking hours                                                                                   []     3    Only limited self care, in bed greater than 50% of waking hours []     4    Completely disabled, no self care,  confined to bed or chair []     5    Moribund  Social History   Tobacco Use  Smoking Status Former Smoker  . Packs/day: 0.25  . Years: 15.00  . Pack years: 3.75  . Types: Cigarettes  . Last attempt to quit: 09/28/2017  . Years since quitting: 0.0  Smokeless Tobacco Never Used       Allergies  Allergen Reactions  . Augmentin [Amoxicillin-Pot Clavulanate] Diarrhea and Nausea And Vomiting  . Penicillins Nausea And Vomiting and Other (See Comments)    Has patient had a PCN reaction causing immediate rash, facial/tongue/throat swelling, SOB or lightheadedness with hypotension: Yes Has patient had a PCN reaction causing severe rash involving mucus membranes or skin necrosis: No Has patient had a PCN reaction that required hospitalization: No Has patient had a PCN  reaction occurring within the last 10 years: Yes If all of the above answers are "NO", then may proceed with Cephalosporin use.     Current Outpatient Medications  Medication Sig Dispense Refill  . ALPRAZolam (XANAX) 0.5 MG tablet Take 0.5 mg by mouth at bedtime as needed for anxiety or sleep.     . Ascorbic Acid (VITAMIN C) 1000 MG tablet Take 1,000 mg by mouth daily.      . cholecalciferol (VITAMIN D) 1000 units tablet Take 1,000 Units by mouth daily.    [name] escitalopram (LEXAPRO) 10 MG tablet Take 10 mg by mouth daily.     [name] ibuprofen (ADVIL,MOTRIN) 200 MG tablet Take 400 mg by mouth every 6 (six) hours as needed for headache or moderate pain.      No current facility-administered medications for this visit.        Physical Exam: BP 128/73   Pulse 81   Resp 20   Ht 5\' 5"  (1.651 m)   Wt 126 lb (57.2 kg)   SpO2 98% Comment: RA  BMI 20.97 kg/m   General appearance: alert, cooperative and no distress Neurologic: intact Heart: regular rate and rhythm, S1, S2 normal, no murmur, click, rub or gallop Lungs: clear to auscultation bilaterally Abdomen: soft, non-tender; bowel sounds normal; no masses,  no  organomegaly Extremities: extremities normal, atraumatic, no cyanosis or edema and Homans sign is negative, no sign of DVT Wound: Chest tube sutures were removed   Diagnostic Studies & Laboratory data:         Recent Radiology Findings:   Dg Chest 2 View  Result Date: 10/16/2017 CLINICAL DATA:  Hx VATS 09/30/2017, doing well, former smoker EXAM: CHEST - 2 VIEW COMPARISON:  10/04/2017 FINDINGS: Heart size is normal. There is increased opacity in the LEFT lung base consistent with atelectasis and infiltrate or effusion. The RIGHT lung is clear. No pulmonary edema. No pneumothorax. IMPRESSION: Increased atelectasis or infiltrate and pleural effusion at the LEFT lung base. Electronically Signed   By: Nolon Nations M.D.   On: 10/16/2017 14:29   Mr Jeri Cos BH Contrast  Result Date: 10/15/2017 CLINICAL DATA:  Newly diagnosed small cell lung cancer.  Staging. EXAM: MRI HEAD WITHOUT AND WITH CONTRAST TECHNIQUE: Multiplanar, multiecho pulse sequences of the brain and surrounding structures were obtained without and with intravenous contrast. CONTRAST:  77mL MULTIHANCE GADOBENATE DIMEGLUMINE 529 MG/ML IV SOLN COMPARISON:  None. FINDINGS: Brain: There is no evidence of acute infarct, intracranial hemorrhage, mass, midline shift, or extra-axial fluid collection. The ventricles and sulci are normal for age. Scattered T2 hyperintensities in the cerebral white matter and patchy T2 hyperintensities in the pons are nonspecific but compatible with mild chronic small vessel ischemic disease. There is a small chronic infarct in the posteromedial right cerebellar hemisphere. No abnormal enhancement is identified. Vascular: Major intracranial vascular flow voids are preserved. Skull and upper cervical spine: No suspicious marrow lesion. Sinuses/Orbits: Unremarkable orbits. Paranasal sinuses and mastoid air cells are clear. Other: None. IMPRESSION: 1. No evidence of intracranial metastases or acute abnormality. 2. Mild  chronic small vessel ischemic disease. 3. Small chronic right cerebellar infarct. Electronically Signed   By: Logan Bores M.D.   On: 10/15/2017 11:28      I have independently reviewed the above radiology findings and reviewed findings  with the patient.  Recent Labs: Lab Results  Component Value Date   WBC 7.1 10/10/2017   HGB 10.0 (L) 10/10/2017   HCT 29.5 (L) 10/10/2017  PLT 324 10/10/2017   GLUCOSE 120 10/10/2017   ALT 27 10/10/2017   AST 16 10/10/2017   NA 138 10/10/2017   K 4.6 10/10/2017   CL 102 10/10/2017   CREATININE 0.82 10/10/2017   BUN 14 10/10/2017   CO2 29 10/10/2017   INR 0.98 09/25/2017      Assessment / Plan:   Patient doing well following recent left lower lobectomy and lymph node dissection for small cell carcinoma of the lung 3.5 cm negative nodes.  Patient has been seen by medical oncology and postoperative chemotherapy has been recommended to the patient.  In a discussion today she is agreeable with proceeding and asked that we would let Dr. Julien Nordmann know   plan to see her back in 3 to 4 weeks with a follow-up chest x-ray.  MRI results were reviewed with her   Grace Isaac 10/16/2017 2:44 PM

## 2017-10-20 ENCOUNTER — Telehealth: Payer: Self-pay | Admitting: Internal Medicine

## 2017-10-20 NOTE — Telephone Encounter (Signed)
Scheduled appt per 4/28 sch msg - left vm for pt re appts.

## 2017-10-27 ENCOUNTER — Other Ambulatory Visit: Payer: Self-pay | Admitting: Internal Medicine

## 2017-10-27 ENCOUNTER — Telehealth: Payer: Self-pay | Admitting: Internal Medicine

## 2017-10-27 ENCOUNTER — Inpatient Hospital Stay: Payer: 59 | Attending: Internal Medicine | Admitting: Internal Medicine

## 2017-10-27 ENCOUNTER — Encounter: Payer: Self-pay | Admitting: Internal Medicine

## 2017-10-27 VITALS — BP 144/77 | HR 76 | Temp 98.6°F | Resp 17 | Ht 65.0 in | Wt 125.8 lb

## 2017-10-27 DIAGNOSIS — R634 Abnormal weight loss: Secondary | ICD-10-CM | POA: Diagnosis not present

## 2017-10-27 DIAGNOSIS — Z5111 Encounter for antineoplastic chemotherapy: Secondary | ICD-10-CM | POA: Insufficient documentation

## 2017-10-27 DIAGNOSIS — R110 Nausea: Secondary | ICD-10-CM | POA: Insufficient documentation

## 2017-10-27 DIAGNOSIS — K5900 Constipation, unspecified: Secondary | ICD-10-CM | POA: Insufficient documentation

## 2017-10-27 DIAGNOSIS — C3432 Malignant neoplasm of lower lobe, left bronchus or lung: Secondary | ICD-10-CM

## 2017-10-27 DIAGNOSIS — Z7189 Other specified counseling: Secondary | ICD-10-CM

## 2017-10-27 MED ORDER — PROCHLORPERAZINE MALEATE 10 MG PO TABS: 10.0000 mg | ORAL_TABLET | Freq: Four times a day (QID) | ORAL | 0 refills | Status: DC | PRN

## 2017-10-27 NOTE — Progress Notes (Signed)
[facility] [phone]   [phone]  OFFICE PROGRESS NOTE  [name], MD [address] [place] [address]  DIAGNOSIS: Limited stage (T2 a, N0, M0) of small cell lung cancer presented with left lower lobe lung mass status post left lower lobectomy with lymph node dissection on September 30, 2017.  PRIOR THERAPY: None.  CURRENT THERAPY: Adjuvant systemic chemotherapy was 4 cycles of cisplatin 80 mg/M2 on day 1 and etoposide 100 mg/M2 on days 1, 2 and 3 every 3 weeks.  First dose expected Nov 03, 2017  INTERVAL HISTORY: Brandi Crosby 62 y.o. female returns to the clinic today for follow-up visit accompanied by her husband.  The patient is feeling fine today with no specific complaints.  She denied having any recent chest pain, shortness breath, cough or hemoptysis.  She denied having any recent weight loss or night sweats.  She has no current nausea, vomiting, diarrhea or constipation.  She is here today for reevaluation and discussion of her treatment options.  MEDICAL HISTORY: Past Medical History:  Diagnosis Date  . Allergy   . Anxiety   . Depression   . Hyperlipemia   . Lung mass   . Shingles     ALLERGIES:  is allergic to augmentin [amoxicillin-pot clavulanate] and penicillins.  MEDICATIONS:  Current Outpatient Medications  Medication Sig Dispense Refill  . ALPRAZolam (XANAX) 0.5 MG tablet Take 0.5 mg by mouth at bedtime as needed for anxiety or sleep.     . Ascorbic Acid (VITAMIN C) 1000 MG tablet Take 1,000 mg by mouth daily.      . cholecalciferol (VITAMIN D) 1000 units tablet Take 1,000 Units by mouth daily.    [name] escitalopram (LEXAPRO) 10 MG tablet Take 10 mg by mouth daily.     [name] ibuprofen (ADVIL,MOTRIN) 200 MG tablet Take 400 mg by mouth every 6 (six) hours as needed for headache or moderate pain.      No current facility-administered medications for this visit.     SURGICAL HISTORY:  Past Surgical History:    Procedure Laterality Date  . DENTAL SURGERY  2010   implant  . NODE DISSECTION Left 09/30/2017   Procedure: NODE DISSECTION;  Surgeon: Grace Isaac, MD;  Location: [facility];  Service: Thoracic;  Laterality: Left;  . RIGHT OOPHORECTOMY    . TONSILLECTOMY AND ADENOIDECTOMY    . VIDEO ASSISTED THORACOSCOPY (VATS)/WEDGE RESECTION Left 09/30/2017   Procedure: VIDEO ASSISTED THORACOSCOPY (VATS)/LUNG RESECTION;  Surgeon: Grace Isaac, MD;  Location: [facility];  Service: Thoracic;  Laterality: Left;  [name] VIDEO BRONCHOSCOPY N/A 09/30/2017   Procedure: VIDEO BRONCHOSCOPY;  Surgeon: Grace Isaac, MD;  Location: [facility] OR;  Service: Thoracic;  Laterality: N/A;  . WISDOM TOOTH EXTRACTION      REVIEW OF SYSTEMS:  Constitutional: negative Eyes: negative Ears, nose, mouth, throat, and face: negative Respiratory: negative Cardiovascular: negative Gastrointestinal: negative Genitourinary:negative Integument/breast: negative Hematologic/lymphatic: negative Musculoskeletal:negative Neurological: negative Behavioral/Psych: negative Endocrine: negative Allergic/Immunologic: negative   PHYSICAL EXAMINATION: General appearance: alert, cooperative and no distress Head: Normocephalic, without obvious abnormality, atraumatic Neck: no adenopathy, no JVD, supple, symmetrical, trachea midline and thyroid not enlarged, symmetric, no tenderness/mass/nodules Lymph nodes: Cervical, supraclavicular, and axillary nodes normal. Resp: clear to auscultation bilaterally Back: symmetric, no curvature. ROM normal. No CVA tenderness. Cardio: regular rate and rhythm, S1, S2 normal, no murmur, click, rub or gallop GI: soft, non-tender; bowel sounds normal; no masses,  no organomegaly  Extremities: extremities normal, atraumatic, no cyanosis or edema Neurologic: Alert and oriented X 3, normal strength and tone. Normal symmetric reflexes. Normal coordination and gait  ECOG PERFORMANCE STATUS: 0 - Asymptomatic  Blood  pressure (!) 144/77, pulse 76, temperature 98.6 F (37 C), temperature source Oral, resp. rate 17, height 5\' 5"  (1.651 m), weight 125 lb 12.8 oz (57.1 kg), SpO2 100 %.  LABORATORY DATA: Lab Results  Component Value Date   WBC 7.1 10/10/2017   HGB 10.0 (L) 10/10/2017   HCT 29.5 (L) 10/10/2017   MCV 96.1 10/10/2017   PLT 324 10/10/2017      Chemistry      Component Value Date/Time   NA 138 10/10/2017 0954   K 4.6 10/10/2017 0954   CL 102 10/10/2017 0954   CO2 29 10/10/2017 0954   BUN 14 10/10/2017 0954   CREATININE 0.82 10/10/2017 0954      Component Value Date/Time   CALCIUM 9.7 10/10/2017 0954   ALKPHOS 62 10/10/2017 0954   AST 16 10/10/2017 0954   ALT 27 10/10/2017 0954   BILITOT 0.4 10/10/2017 0954       RADIOGRAPHIC STUDIES: Dg Chest 2 View  Result Date: 10/16/2017 CLINICAL DATA:  Hx VATS 09/30/2017, doing well, former smoker EXAM: CHEST - 2 VIEW COMPARISON:  10/04/2017 FINDINGS: Heart size is normal. There is increased opacity in the LEFT lung base consistent with atelectasis and infiltrate or effusion. The RIGHT lung is clear. No pulmonary edema. No pneumothorax. IMPRESSION: Increased atelectasis or infiltrate and pleural effusion at the LEFT lung base. Electronically Signed   By: Nolon Nations M.D.   On: 10/16/2017 14:29   Dg Chest 2 View  Result Date: 10/04/2017 CLINICAL DATA:  Chest tube removal. EXAM: CHEST - 2 VIEW COMPARISON:  Chest x-ray dated 10/03/2017. FINDINGS: LEFT-sided chest tube has been removed. Tiny residual pneumothorax at the LEFT lung apex, stable compared to yesterday's exam. Probable mild atelectasis at the LEFT lung base. Lungs otherwise clear. Heart size and mediastinal contours are stable. No acute or suspicious osseous finding. IMPRESSION: Status post LEFT-sided chest tube removal. Persistent tiny pneumothorax at the LEFT lung apex, stable. Probable mild atelectasis at the LEFT lung base. Electronically Signed   By: Franki Cabot M.D.   On:  10/04/2017 07:44   Dg Chest 2 View  Result Date: 09/30/2017 CLINICAL DATA:  Cavitating mass in the left lower lobe.  Preop. EXAM: CHEST - 2 VIEW COMPARISON:  Radiograph and CT 08/25/2017 FINDINGS: The left lower lobe pulmonary mass is best visualized on the lateral view and similar from prior radiographs. No acute airspace disease. Normal heart size and mediastinal contours. No pulmonary edema, pleural effusion or pneumothorax. Mild scoliotic curvature of the mid upper thoracic spine. IMPRESSION: Left lower lobe pulmonary mass grossly unchanged in radiographic appearance from prior exam. No acute abnormality. Electronically Signed   By: Jeb Levering M.D.   On: 09/30/2017 06:11   Mr Jeri Cos TG Contrast  Result Date: 10/15/2017 CLINICAL DATA:  Newly diagnosed small cell lung cancer.  Staging. EXAM: MRI HEAD WITHOUT AND WITH CONTRAST TECHNIQUE: Multiplanar, multiecho pulse sequences of the brain and surrounding structures were obtained without and with intravenous contrast. CONTRAST:  19mL MULTIHANCE GADOBENATE DIMEGLUMINE 529 MG/ML IV SOLN COMPARISON:  None. FINDINGS: Brain: There is no evidence of acute infarct, intracranial hemorrhage, mass, midline shift, or extra-axial fluid collection. The ventricles and sulci are normal for age. Scattered T2 hyperintensities in the cerebral white matter and patchy T2 hyperintensities in  the pons are nonspecific but compatible with mild chronic small vessel ischemic disease. There is a small chronic infarct in the posteromedial right cerebellar hemisphere. No abnormal enhancement is identified. Vascular: Major intracranial vascular flow voids are preserved. Skull and upper cervical spine: No suspicious marrow lesion. Sinuses/Orbits: Unremarkable orbits. Paranasal sinuses and mastoid air cells are clear. Other: None. IMPRESSION: 1. No evidence of intracranial metastases or acute abnormality. 2. Mild chronic small vessel ischemic disease. 3. Small chronic right  cerebellar infarct. Electronically Signed   By: Logan Bores M.D.   On: 10/15/2017 11:28   Dg Chest Port 1 View  Result Date: 10/03/2017 CLINICAL DATA:  62 year old female postoperative day 3 status post VATS, mini-thoracotomy with left lower lobectomy for lung mass. EXAM: PORTABLE CHEST 1 VIEW COMPARISON:  10/02/2017 and earlier. FINDINGS: Portable AP semi upright view at 0710 hours. Stable left chest tube. Trace residual left pneumothorax only visible at the apex. Improved left lung volume and decreased opacity at the left lung base. Mediastinal contours are within normal limits. Visualized tracheal air column is within normal limits. Negative right lung. Negative visible bowel gas pattern. IMPRESSION: 1. Decreasing left pneumothorax, now trace. Stable left chest tube. Improved left lung base ventilation. 2. No new cardiopulmonary abnormality. Electronically Signed   By: Genevie Ann M.D.   On: 10/03/2017 08:53   Dg Chest Port 1 View  Result Date: 10/02/2017 CLINICAL DATA:  Status post lobectomy EXAM: PORTABLE CHEST 1 VIEW COMPARISON:  10/01/2017 FINDINGS: Postoperative changes on the left. Left basilar atelectasis. Small left basilar and apical pneumothorax, new since prior study. Heart is normal size. Right lung is clear. IMPRESSION: Small left apical and basilar pneumothorax with left chest tube remaining in place. Left base atelectasis. Electronically Signed   By: Rolm Baptise M.D.   On: 10/02/2017 09:06   Dg Chest Port 1 View  Result Date: 10/01/2017 CLINICAL DATA:  Status post left lobectomy EXAM: PORTABLE CHEST 1 VIEW COMPARISON:  09/30/2017 FINDINGS: Cardiac shadow is stable. Improved aeration in the right upper lobe is noted. Right jugular central line is been removed in the interval. Two left-sided chest tubes are seen without pneumothorax. No other focal abnormality is noted. IMPRESSION: Improved aeration in the right upper lobe. No pneumothorax on the left. Electronically Signed   By: Inez Catalina M.D.   On: 10/01/2017 08:17   Dg Chest Port 1 View  Result Date: 09/30/2017 CLINICAL DATA:  Status post VATS.  Left chest tubes. EXAM: PORTABLE CHEST 1 VIEW COMPARISON:  09/30/2017. FINDINGS: Right IJ line noted with tip over superior vena cava. Postsurgical changes left lung with left chest tubes noted. Mild left apical and left base pneumothorax may be present. Atelectasis right upper lung. Mild subsegmental atelectasis both lung bases. Cardiomegaly with normal pulmonary vascularity. IMPRESSION: 1. Postsurgical changes left lung with chest tubes noted. Mild left apical and left base pneumothorax . Right IJ line in good anatomic position. 2. Right upper lobe atelectasis. Mild subsegmental atelectasis both lung bases. Critical Value/emergent results were called by telephone at the time of interpretation on 09/30/2017 at 1:32 pm to nurse Corey Skains, who verbally acknowledged these results. Electronically Signed   By: Marcello Moores  Register   On: 09/30/2017 13:35    ASSESSMENT AND PLAN: This is a very pleasant 62 years old white female recently diagnosed with limited stage small cell lung cancer status post surgical resection.  The patient is here today for evaluation and discussion of her treatment options. I have a lengthy discussion  with the patient and her husband about her current condition and treatment options.  I recommended for the patient treatment with adjuvant systemic chemotherapy with cisplatin 80 mg/M2 on day 1 and etoposide 100 mg/M2 on days 1, 2 and 3 every 3 weeks. I discussed with the patient the adverse effect of this treatment including but not limited to alopecia, myelosuppression, nausea and vomiting, peripheral neuropathy, liver or renal dysfunction. She will have weekly lab during her chemotherapy. I will arrange for the patient to have a chemotherapy education class before the first dose of her treatment. She is expected to start the first dose of this treatment on Nov 03, 2017. She will  come back for follow-up visit in 2 weeks for evaluation and management of any adverse effect of her treatment. She was advised to call immediately if she has any concerning symptoms in the interval. The patient voices understanding of current disease status and treatment options and is in agreement with the current care plan.  All questions were answered. The patient knows to call the clinic with any problems, questions or concerns. We can certainly see the patient much sooner if necessary.  I spent 15 minutes counseling the patient face to face. The total time spent in the appointment was 25 minutes.  Disclaimer: This note was dictated with voice recognition software. Similar sounding words can inadvertently be transcribed and may not be corrected upon review.

## 2017-10-27 NOTE — Progress Notes (Signed)
START ON PATHWAY REGIMEN - Small Cell Lung     A cycle is every 21 days:     Etoposide      Cisplatin   **Always confirm dose/schedule in your pharmacy ordering system**    Patient Characteristics: Resected Stage Classification: Limited AJCC T Category: T2a AJCC N Category: N0 AJCC M Category: M0 AJCC 8 Stage Grouping: IB Line of therapy: Resected  Intent of Therapy: Curative Intent, Discussed with Patient

## 2017-10-27 NOTE — Telephone Encounter (Signed)
Scheduled appt per 5/6 los - gave patient aVS and calender per los.

## 2017-10-29 ENCOUNTER — Inpatient Hospital Stay: Payer: 59

## 2017-11-04 ENCOUNTER — Inpatient Hospital Stay: Payer: 59

## 2017-11-04 VITALS — BP 144/71 | HR 74 | Temp 98.6°F | Resp 16

## 2017-11-04 DIAGNOSIS — Z5111 Encounter for antineoplastic chemotherapy: Secondary | ICD-10-CM | POA: Diagnosis not present

## 2017-11-04 DIAGNOSIS — C3432 Malignant neoplasm of lower lobe, left bronchus or lung: Secondary | ICD-10-CM

## 2017-11-04 LAB — CMP (CANCER CENTER ONLY)
ALT: 13 U/L (ref 0–55)
AST: 16 U/L (ref 5–34)
Albumin: 4 g/dL (ref 3.5–5.0)
Alkaline Phosphatase: 85 U/L (ref 40–150)
Anion gap: 9 (ref 3–11)
BUN: 8 mg/dL (ref 7–26)
CO2: 26 mmol/L (ref 22–29)
Calcium: 9.6 mg/dL (ref 8.4–10.4)
Chloride: 106 mmol/L (ref 98–109)
Creatinine: 0.81 mg/dL (ref 0.60–1.10)
GFR, Est AFR Am: >60 mL/min (ref >60)
GFR, Estimated: >60 mL/min (ref >60)
Glucose, Bld: 107 mg/dL (ref 70–140)
Potassium: 4.1 mmol/L (ref 3.5–5.1)
Sodium: 141 mmol/L (ref 136–145)
Total Bilirubin: 0.3 mg/dL (ref 0.2–1.2)
Total Protein: 7.6 g/dL (ref 6.4–8.3)

## 2017-11-04 LAB — CBC WITH DIFFERENTIAL (CANCER CENTER ONLY)
Basophils Absolute: 0 10*3/uL (ref 0.0–0.1)
Basophils Relative: 1 %
Eosinophils Absolute: 0.2 10*3/uL (ref 0.0–0.5)
Eosinophils Relative: 4 %
HCT: 36.4 % (ref 34.8–46.6)
Hemoglobin: 12.1 g/dL (ref 11.6–15.9)
Lymphocytes Relative: 25 %
Lymphs Abs: 1.4 10*3/uL (ref 0.9–3.3)
MCH: 30 pg (ref 25.1–34.0)
MCHC: 33.1 g/dL (ref 31.5–36.0)
MCV: 90.5 fL (ref 79.5–101.0)
Monocytes Absolute: 0.5 10*3/uL (ref 0.1–0.9)
Monocytes Relative: 10 %
Neutro Abs: 3.5 10*3/uL (ref 1.5–6.5)
Neutrophils Relative %: 60 %
Platelet Count: 320 10*3/uL (ref 145–400)
RBC: 4.02 MIL/uL (ref 3.70–5.45)
RDW: 13.7 % (ref 11.2–14.5)
WBC Count: 5.7 10*3/uL (ref 3.9–10.3)

## 2017-11-04 LAB — MAGNESIUM: Magnesium: 1.9 mg/dL (ref 1.7–2.4)

## 2017-11-04 MED ORDER — POTASSIUM CHLORIDE 2 MEQ/ML IV SOLN
Freq: Once | INTRAVENOUS | Status: AC
  Administered 2017-11-04: 09:00:00 via INTRAVENOUS
  Filled 2017-11-04: qty 10

## 2017-11-04 MED ORDER — PALONOSETRON HCL INJECTION 0.25 MG/5ML
INTRAVENOUS | Status: AC
  Filled 2017-11-04: qty 5

## 2017-11-04 MED ORDER — SODIUM CHLORIDE 0.9 % IV SOLN
Freq: Once | INTRAVENOUS | Status: AC
  Administered 2017-11-04: 09:00:00 via INTRAVENOUS

## 2017-11-04 MED ORDER — FOSAPREPITANT DIMEGLUMINE INJECTION 150 MG
Freq: Once | INTRAVENOUS | Status: AC
  Administered 2017-11-04: 11:00:00 via INTRAVENOUS
  Filled 2017-11-04: qty 5

## 2017-11-04 MED ORDER — PALONOSETRON HCL INJECTION 0.25 MG/5ML
0.2500 mg | Freq: Once | INTRAVENOUS | Status: AC
  Administered 2017-11-04: 0.25 mg via INTRAVENOUS

## 2017-11-04 MED ORDER — ETOPOSIDE CHEMO INJECTION 1 GM/50ML
100.0000 mg/m2 | Freq: Once | INTRAVENOUS | Status: AC
  Administered 2017-11-04: 160 mg via INTRAVENOUS
  Filled 2017-11-04: qty 8

## 2017-11-04 MED ORDER — SODIUM CHLORIDE 0.9 % IV SOLN
80.0000 mg/m2 | Freq: Once | INTRAVENOUS | Status: AC
  Administered 2017-11-04: 130 mg via INTRAVENOUS
  Filled 2017-11-04: qty 130

## 2017-11-04 NOTE — Patient Instructions (Signed)
[facility] Discharge Instructions for Patients Receiving Chemotherapy  Today you received the following chemotherapy agents cisplatin/etoposide   To help prevent nausea and vomiting after your treatment, we encourage you to take your nausea medication as directed  If you develop nausea and vomiting that is not controlled by your nausea medication, call the clinic.   BELOW ARE SYMPTOMS THAT SHOULD BE REPORTED IMMEDIATELY:  *FEVER GREATER THAN 100.5 F  *CHILLS WITH OR WITHOUT FEVER  NAUSEA AND VOMITING THAT IS NOT CONTROLLED WITH YOUR NAUSEA MEDICATION  *UNUSUAL SHORTNESS OF BREATH  *UNUSUAL BRUISING OR BLEEDING  TENDERNESS IN MOUTH AND THROAT WITH OR WITHOUT PRESENCE OF ULCERS  *URINARY PROBLEMS  *BOWEL PROBLEMS  UNUSUAL RASH Items with * indicate a potential emergency and should be followed up as soon as possible.  Feel free to call the clinic you have any questions or concerns. The clinic phone number is [phone].

## 2017-11-05 ENCOUNTER — Encounter: Payer: Self-pay | Admitting: Internal Medicine

## 2017-11-05 ENCOUNTER — Inpatient Hospital Stay: Payer: 59

## 2017-11-05 VITALS — BP 122/74 | HR 64 | Temp 98.6°F | Resp 18

## 2017-11-05 DIAGNOSIS — Z5111 Encounter for antineoplastic chemotherapy: Secondary | ICD-10-CM | POA: Diagnosis not present

## 2017-11-05 DIAGNOSIS — C3432 Malignant neoplasm of lower lobe, left bronchus or lung: Secondary | ICD-10-CM

## 2017-11-05 MED ORDER — SODIUM CHLORIDE 0.9 % IV SOLN
100.0000 mg/m2 | Freq: Once | INTRAVENOUS | Status: AC
  Administered 2017-11-05: 160 mg via INTRAVENOUS
  Filled 2017-11-05: qty 8

## 2017-11-05 MED ORDER — DEXAMETHASONE SODIUM PHOSPHATE 10 MG/ML IJ SOLN
10.0000 mg | Freq: Once | INTRAMUSCULAR | Status: AC
  Administered 2017-11-05: 10 mg via INTRAVENOUS

## 2017-11-05 MED ORDER — SODIUM CHLORIDE 0.9 % IV SOLN
Freq: Once | INTRAVENOUS | Status: AC
  Administered 2017-11-05: 09:00:00 via INTRAVENOUS

## 2017-11-05 MED ORDER — DEXAMETHASONE SODIUM PHOSPHATE 10 MG/ML IJ SOLN
INTRAMUSCULAR | Status: AC
  Filled 2017-11-05: qty 1

## 2017-11-05 NOTE — Patient Instructions (Signed)
[facility] Discharge Instructions for Patients Receiving Chemotherapy  Today you received the following chemotherapy agents Etoposide  To help prevent nausea and vomiting after your treatment, we encourage you to take your nausea medication as directed   If you develop nausea and vomiting that is not controlled by your nausea medication, call the clinic.   BELOW ARE SYMPTOMS THAT SHOULD BE REPORTED IMMEDIATELY:  *FEVER GREATER THAN 100.5 F  *CHILLS WITH OR WITHOUT FEVER  NAUSEA AND VOMITING THAT IS NOT CONTROLLED WITH YOUR NAUSEA MEDICATION  *UNUSUAL SHORTNESS OF BREATH  *UNUSUAL BRUISING OR BLEEDING  TENDERNESS IN MOUTH AND THROAT WITH OR WITHOUT PRESENCE OF ULCERS  *URINARY PROBLEMS  *BOWEL PROBLEMS  UNUSUAL RASH Items with * indicate a potential emergency and should be followed up as soon as possible.  Feel free to call the clinic should you have any questions or concerns. The clinic phone number is [phone].  Please show the [place] at check-in to the Emergency Department and triage nurse.

## 2017-11-05 NOTE — Progress Notes (Signed)
Met w/ pt to introduce myself as her Arboriculturist.  Unfortunately there aren't any foundations offering copay assistance for her Dx.  I offered the [employer], went over what it covers and gave her the income requirement.  She stated she exceeds it so she won't qualify for the grant.  I gave her my card for any questions or concerns she may have in the future.

## 2017-11-06 ENCOUNTER — Inpatient Hospital Stay: Payer: 59

## 2017-11-06 VITALS — BP 146/65 | HR 59 | Temp 97.7°F | Resp 17

## 2017-11-06 DIAGNOSIS — C3432 Malignant neoplasm of lower lobe, left bronchus or lung: Secondary | ICD-10-CM

## 2017-11-06 DIAGNOSIS — Z5111 Encounter for antineoplastic chemotherapy: Secondary | ICD-10-CM | POA: Diagnosis not present

## 2017-11-06 MED ORDER — ETOPOSIDE CHEMO INJECTION 1 GM/50ML
100.0000 mg/m2 | Freq: Once | INTRAVENOUS | Status: AC
  Administered 2017-11-06: 160 mg via INTRAVENOUS
  Filled 2017-11-06: qty 8

## 2017-11-06 MED ORDER — SODIUM CHLORIDE 0.9% FLUSH
10.0000 mL | INTRAVENOUS | Status: DC | PRN
  Filled 2017-11-06: qty 10

## 2017-11-06 MED ORDER — HEPARIN SOD (PORK) LOCK FLUSH 100 UNIT/ML IV SOLN
500.0000 [IU] | Freq: Once | INTRAVENOUS | Status: DC | PRN
  Filled 2017-11-06: qty 5

## 2017-11-06 MED ORDER — SODIUM CHLORIDE 0.9 % IV SOLN
Freq: Once | INTRAVENOUS | Status: AC
  Administered 2017-11-06: 08:00:00 via INTRAVENOUS

## 2017-11-06 MED ORDER — DEXAMETHASONE SODIUM PHOSPHATE 10 MG/ML IJ SOLN
10.0000 mg | Freq: Once | INTRAMUSCULAR | Status: AC
  Administered 2017-11-06: 10 mg via INTRAVENOUS

## 2017-11-06 MED ORDER — DEXAMETHASONE SODIUM PHOSPHATE 10 MG/ML IJ SOLN
INTRAMUSCULAR | Status: AC
  Filled 2017-11-06: qty 1

## 2017-11-06 NOTE — Patient Instructions (Signed)
[facility] Discharge Instructions for Patients Receiving Chemotherapy  Today you received the following chemotherapy agents Etoposide  To help prevent nausea and vomiting after your treatment, we encourage you to take your nausea medication as directed   If you develop nausea and vomiting that is not controlled by your nausea medication, call the clinic.   BELOW ARE SYMPTOMS THAT SHOULD BE REPORTED IMMEDIATELY:  *FEVER GREATER THAN 100.5 F  *CHILLS WITH OR WITHOUT FEVER  NAUSEA AND VOMITING THAT IS NOT CONTROLLED WITH YOUR NAUSEA MEDICATION  *UNUSUAL SHORTNESS OF BREATH  *UNUSUAL BRUISING OR BLEEDING  TENDERNESS IN MOUTH AND THROAT WITH OR WITHOUT PRESENCE OF ULCERS  *URINARY PROBLEMS  *BOWEL PROBLEMS  UNUSUAL RASH Items with * indicate a potential emergency and should be followed up as soon as possible.  Feel free to call the clinic should you have any questions or concerns. The clinic phone number is [phone].  Please show the [place] at check-in to the Emergency Department and triage nurse.

## 2017-11-11 ENCOUNTER — Telehealth: Payer: Self-pay | Admitting: Nurse Practitioner

## 2017-11-11 ENCOUNTER — Encounter: Payer: Self-pay | Admitting: Nurse Practitioner

## 2017-11-11 ENCOUNTER — Inpatient Hospital Stay: Payer: 59

## 2017-11-11 ENCOUNTER — Other Ambulatory Visit: Payer: Self-pay | Admitting: Cardiothoracic Surgery

## 2017-11-11 ENCOUNTER — Inpatient Hospital Stay (HOSPITAL_BASED_OUTPATIENT_CLINIC_OR_DEPARTMENT_OTHER): Payer: 59 | Admitting: Nurse Practitioner

## 2017-11-11 VITALS — BP 131/63 | HR 72 | Temp 98.1°F | Resp 18 | Ht 65.0 in | Wt 119.3 lb

## 2017-11-11 DIAGNOSIS — C3432 Malignant neoplasm of lower lobe, left bronchus or lung: Secondary | ICD-10-CM | POA: Diagnosis not present

## 2017-11-11 DIAGNOSIS — R110 Nausea: Secondary | ICD-10-CM | POA: Diagnosis not present

## 2017-11-11 DIAGNOSIS — Z5111 Encounter for antineoplastic chemotherapy: Secondary | ICD-10-CM | POA: Diagnosis not present

## 2017-11-11 DIAGNOSIS — R634 Abnormal weight loss: Secondary | ICD-10-CM

## 2017-11-11 DIAGNOSIS — K5900 Constipation, unspecified: Secondary | ICD-10-CM

## 2017-11-11 LAB — CMP (CANCER CENTER ONLY)
ALT: 17 U/L (ref 0–55)
AST: 19 U/L (ref 5–34)
Albumin: 4.2 g/dL (ref 3.5–5.0)
Alkaline Phosphatase: 77 U/L (ref 40–150)
Anion gap: 10 (ref 3–11)
BUN: 16 mg/dL (ref 7–26)
CO2: 26 mmol/L (ref 22–29)
Calcium: 9.7 mg/dL (ref 8.4–10.4)
Chloride: 102 mmol/L (ref 98–109)
Creatinine: 0.83 mg/dL (ref 0.60–1.10)
GFR, Est AFR Am: >60 mL/min (ref >60)
GFR, Estimated: >60 mL/min (ref >60)
Glucose, Bld: 94 mg/dL (ref 70–140)
Potassium: 4.3 mmol/L (ref 3.5–5.1)
Sodium: 138 mmol/L (ref 136–145)
Total Bilirubin: 0.4 mg/dL (ref 0.2–1.2)
Total Protein: 7.6 g/dL (ref 6.4–8.3)

## 2017-11-11 LAB — CBC WITH DIFFERENTIAL (CANCER CENTER ONLY)
Basophils Absolute: 0 10*3/uL (ref 0.0–0.1)
Basophils Relative: 1 %
Eosinophils Absolute: 0.1 10*3/uL (ref 0.0–0.5)
Eosinophils Relative: 2 %
HCT: 35.4 % (ref 34.8–46.6)
Hemoglobin: 11.8 g/dL (ref 11.6–15.9)
Lymphocytes Relative: 36 %
Lymphs Abs: 1.4 10*3/uL (ref 0.9–3.3)
MCH: 29.9 pg (ref 25.1–34.0)
MCHC: 33.4 g/dL (ref 31.5–36.0)
MCV: 89.6 fL (ref 79.5–101.0)
Monocytes Absolute: 0.1 10*3/uL (ref 0.1–0.9)
Monocytes Relative: 2 %
Neutro Abs: 2.3 10*3/uL (ref 1.5–6.5)
Neutrophils Relative %: 59 %
Platelet Count: 202 10*3/uL (ref 145–400)
RBC: 3.95 MIL/uL (ref 3.70–5.45)
RDW: 13.6 % (ref 11.2–14.5)
WBC Count: 3.9 10*3/uL (ref 3.9–10.3)

## 2017-11-11 LAB — MAGNESIUM: Magnesium: 1.8 mg/dL (ref 1.7–2.4)

## 2017-11-11 NOTE — Progress Notes (Signed)
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 ([facility])      10/16/2017 Cancer Staging    Staging form: Lung, AJCC 8th Edition - Pathologic stage from 10/16/2017: pT2, pN0, cM0 - Signed by Grace Isaac, MD on 10/16/2017      11/04/2017 -  Chemotherapy    The patient had palonosetron (ALOXI) injection 0.25 mg, 0.25 mg, Intravenous,  Once, 1 of 4 cycles Administration: 0.25 mg (11/04/2017) CISplatin (PLATINOL) 130 mg in sodium chloride 0.9 % 500 mL chemo infusion, 80 mg/m2 = 130 mg, Intravenous,  Once, 1 of 4 cycles Administration: 130 mg (11/04/2017) etoposide (VEPESID) 160 mg in sodium chloride 0.9 % 500 mL chemo infusion, 100 mg/m2 = 160 mg, Intravenous,  Once, 1 of 4 cycles Administration: 160 mg (11/04/2017), 160 mg (11/05/2017), 160 mg (11/06/2017) fosaprepitant (EMEND) 150 mg, dexamethasone (DECADRON) 12 mg in sodium chloride 0.9 % 145 mL IVPB, , Intravenous,  Once, 1 of 4 cycles Administration:  (11/04/2017)  for chemotherapy treatment.      DIAGNOSIS: Limited stage (T2 a,N0, M0) small cell lung cancer presented with left lower lobe lung mass status post left lower lobectomy with lymph node dissection on September 30, 2017.  PRIOR THERAPY: None.  CURRENT THERAPY: Adjuvant systemic chemotherapy was 4 cycles of cisplatin 80 mg/M2 on day 1 and etoposide 100 mg/M2 on days 1, 2 and3every 3 weeks.  First dose expected Nov 03, 2017   INTERVAL HISTORY: Ms. Darin returns for 1 week follow up following cycle 1 chemotherapy, she completed cisplatin on day 1 and etoposide on days 1 through 3 from 5/14 to 11/06/17. On days 5 and 6 she mad mild fatigue and low appetite. Weight is down 6 lbs.  Her appetite is improving; she has eaten well the past 2 days. She has intermittent mild nausea, compazine helps but makes her slightly dizzy. She has taken 4 tablets since starting chemotherapy. She has mild constipation, maintains regular BM with yogurt. She plans to start miralax PRN. She is mildly anxious about the chemo process in general, controlled with xanax PRN. Denies fever, chills, cough, chest pain, dyspnea, hemoptysis, vomiting, diarrhea, neuropathy, or pain.   REVIEW OF SYSTEMS:   Constitutional: Denies fevers, chills (+) weight loss (+) mild fatigue days 5-6 after chemo Ears, nose, mouth, throat, and face: Denies mucositis or sore throat Respiratory: Denies cough, hemoptysis, dyspnea, or wheezes Cardiovascular: Denies palpitation, chest discomfort or lower extremity swelling Gastrointestinal:  Denies vomiting, diarrhea, heartburn or change in bowel habits (+) mild intermittent nausea (+) mild constipation, managed with diet  Skin: Denies abnormal skin rashes Lymphatics: Denies new lymphadenopathy or easy bruising Neurological:Denies numbness, tingling or new weaknesses Behavioral/Psych: Mood is stable, no new changes (+) anxiety  All other systems were reviewed with the patient and are negative.  MEDICAL HISTORY:  Past Medical History:  Diagnosis Date  . Allergy   . Anxiety   . Depression   . Hyperlipemia   . Lung mass   . Shingles     SURGICAL HISTORY: Past Surgical History:  Procedure Laterality Date  . DENTAL SURGERY  2010   implant  . NODE DISSECTION Left 09/30/2017   Procedure: NODE DISSECTION;  Surgeon: Grace Isaac, MD;  Location: [facility];  Service: Thoracic;  Laterality: Left;  . RIGHT OOPHORECTOMY    . TONSILLECTOMY AND ADENOIDECTOMY    . VIDEO ASSISTED THORACOSCOPY (VATS)/WEDGE RESECTION Left 09/30/2017   Procedure: VIDEO ASSISTED THORACOSCOPY (VATS)/LUNG RESECTION;  Surgeon: Grace Isaac, MD;  Location: [facility];  Service: Thoracic;  Laterality: Left;    [name] VIDEO BRONCHOSCOPY N/A 09/30/2017   Procedure: VIDEO BRONCHOSCOPY;  Surgeon: Grace Isaac, MD;  Location[facility];  Service: Thoracic;  Laterality: N/A;  . WISDOM TOOTH EXTRACTION      I have reviewed the social history and family history with the patient and they are unchanged from previous note.  ALLERGIES:  is allergic to augmentin [amoxicillin-pot clavulanate] and penicillins.  MEDICATIONS:  Current Outpatient Medications  Medication Sig Dispense Refill  . ALPRAZolam (XANAX) 0.5 MG tablet Take 0.5 mg by mouth at bedtime as needed for anxiety or sleep.     . Ascorbic Acid (VITAMIN C) 1000 MG tablet Take 1,000 mg by mouth daily.      . cholecalciferol (VITAMIN D) 1000 units tablet Take 1,000 Units by mouth daily.    [name] escitalopram (LEXAPRO) 10 MG tablet Take 10 mg by mouth daily.     [name] ibuprofen (ADVIL,MOTRIN) 200 MG tablet Take 400 mg by mouth every 6 (six) hours as needed for headache or moderate pain.     [name] prochlorperazine (COMPAZINE) 10 MG tablet Take 1 tablet (10 mg total) by mouth every 6 (six) hours as needed for nausea or vomiting. 30 tablet 0   No current facility-administered medications for this visit.     PHYSICAL EXAMINATION: ECOG PERFORMANCE STATUS: 1 - Symptomatic but completely ambulatory  Vitals:   11/11/17 0816  BP: 131/63  Pulse: 72  Resp: 18  Temp: 98.1 F (36.7 C)  SpO2: 99%   Filed Weights   11/11/17 0816  Weight: 119 lb 4.8 oz (54.1 kg)    GENERAL:alert, no distress and comfortable SKIN:  no rashes or significant lesions EYES: sclera anicteric  OROPHARYNX:no thrush or ulcers LYMPH:  no palpable cervical or supraclavicular lymphadenopathy LUNGS: clear to auscultation with normal breathing effort HEART: regular rate & rhythm ABDOMEN:abdomen soft, non-tender and normal bowel sounds Musculoskeletal:no cyanosis of digits and no clubbing. No peripheral edema  NEURO: alert & oriented x 3 with fluent speech, no focal motor/sensory  deficits  LABORATORY DATA:  I have reviewed the data as listed CBC Latest Ref Rng & Units 11/11/2017 11/04/2017 10/10/2017  WBC 3.9 - 10.3 K/uL 3.9 5.7 7.1  Hemoglobin 11.6 - 15.9 g/dL 11.8 12.1 10.0(L)  Hematocrit 34.8 - 46.6 % 35.4 36.4 29.5(L)  Platelets 145 - 400 K/uL 202 320 324     CMP Latest Ref Rng & Units 11/11/2017 11/04/2017 10/10/2017  Glucose 70 - 140 mg/dL 94 107 120  BUN 7 - 26 mg/dL 16 8 14   Creatinine 0.60 - 1.10 mg/dL 0.83 0.81 0.82  Sodium 136 - 145 mmol/L 138 141 138  Potassium 3.5 - 5.1 mmol/L 4.3 4.1 4.6  Chloride 98 - 109 mmol/L 102 106 102  CO2 22 - 29 mmol/L 26 26 29   Calcium 8.4 - 10.4 mg/dL 9.7 9.6 9.7  Total Protein 6.4 - 8.3 g/dL 7.6 7.6 6.5  Total Bilirubin 0.2 - 1.2 mg/dL 0.4 0.3 0.4  Alkaline Phos 40 - 150 U/L 77 85 62  AST 5 - 34 U/L 19 16 16   ALT 0 - 55 U/L 17 13 27  RADIOGRAPHIC STUDIES: I have personally reviewed the radiological images as listed and agreed with the findings in the report. No results found.   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 Imbert is clinically doing well. She tolerated cycle 1 cisplatin/etoposide well overall. She had mild fatigue and low appetite, which are both improving today. She plans to begin lite exercise today; I cautioned her to hydrate well, avoid peak sun hours, and use sun-protection. For weight loss, I advised her to supplement meals with boost/ensure, she agrees. For mild intermittent nausea, she will continue compazine PRN and will try 0.5 tab to see if this reduces dizziness while still affording good anti-nausea control. She plans to start mirlax for constipation. VSS. Labs reviewed, CBC, CMP, and Mg are normal. I reviewed high risk of myelosuppression and signs or symptoms that should prompt her to call [facility] urgently such as fever, chills, severe fatigue, bleeding, or change in respiratory status. She agrees. Continue labs weekly on chemo; f/u in 2  weeks prior to cycle 2. She plans to see Dr. Servando Snare later this week for routine f/u.   All questions were answered. The patient knows to call the clinic with any problems, questions or concerns. No barriers to learning was detected.     Alla Feeling, NP 11/11/17

## 2017-11-11 NOTE — Telephone Encounter (Signed)
NO LOS 5/21

## 2017-11-13 ENCOUNTER — Ambulatory Visit
Admission: RE | Admit: 2017-11-13 | Discharge: 2017-11-13 | Disposition: A | Discharge status: Home / Self Care | Payer: 59 | Source: Ambulatory Visit | Attending: Cardiothoracic Surgery | Admitting: Cardiothoracic Surgery

## 2017-11-13 ENCOUNTER — Ambulatory Visit (INDEPENDENT_AMBULATORY_CARE_PROVIDER_SITE_OTHER): Payer: Self-pay | Admitting: Cardiothoracic Surgery

## 2017-11-13 VITALS — BP 102/58 | HR 70 | Resp 20 | Ht 65.0 in | Wt 119.0 lb

## 2017-11-13 DIAGNOSIS — C3432 Malignant neoplasm of lower lobe, left bronchus or lung: Secondary | ICD-10-CM

## 2017-11-13 DIAGNOSIS — Z902 Acquired absence of lung [part of]: Secondary | ICD-10-CM

## 2017-11-13 NOTE — Progress Notes (Signed)
[address]       [place] [address]             [phone]                  Thanvi D Kutsch [facility] Medical Record #[id number] Date of Birth: 1956-06-09  Referring DX:AJOINO, Jamse Belfast, MD Primary Cardiology: Primary Care:Meyers, Annie Main, MD  Chief Complaint:  Follow Up Visit 09/30/2017 OPERATIVE REPORT PREOPERATIVE DIAGNOSIS:  Left lower lobe lung mass. POSTOPERATIVE DIAGNOSIS:  Left lower lobe lung malignancy, small cell carcinoma. PROCEDURE PERFORMED:  Video bronchoscopy with brushings, left video- assisted thoracoscopy, mini thoracotomy, left lower lobectomy with lymph node dissection and placement of On-Q device. 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  She is tolerated around 1 without difficulty.  She is increasing her physical activity appropriately.  Denies shortness of breath cough wheezing or hemoptysis.     Zubrod Score: At the time of surgery this patient's most appropriate activity status/level should be described as: [x]     0    Normal activity, no symptoms []     1    Restricted in physical strenuous activity but ambulatory, able to do out light work []     2    Ambulatory and capable of self care, unable to do work activities, up and about                 >50 % of waking hours                                                                                   []     3    Only limited self care, in bed greater than 50% of waking hours []     4    Completely disabled, no self care, confined to bed or chair []     5    Moribund  Social History   Tobacco Use  Smoking Status Former  Smoker  . Packs/day: 0.25  . Years: 15.00  . Pack years: 3.75  . Types: Cigarettes  . Last attempt to quit: 09/28/2017  . Years since quitting: 0.1  Smokeless Tobacco Never Used       Allergies  Allergen Reactions  . Augmentin [Amoxicillin-Pot Clavulanate] Diarrhea and Nausea And Vomiting  . Penicillins Nausea And Vomiting and Other (See Comments)    Has patient had a PCN reaction causing immediate rash, facial/tongue/throat swelling, SOB or lightheadedness with hypotension: Yes Has patient had a PCN reaction causing severe rash involving mucus membranes or skin necrosis: No Has patient had a PCN reaction that required hospitalization: No Has patient had a PCN reaction occurring within the last 10 years: Yes If all of the above answers are "NO", then may proceed with Cephalosporin use.  Current Outpatient Medications  Medication Sig Dispense Refill  . ALPRAZolam (XANAX) 0.5 MG tablet Take 0.5 mg by mouth at bedtime as needed for anxiety or sleep.     . Ascorbic Acid (VITAMIN C) 1000 MG tablet Take 1,000 mg by mouth daily.      . cholecalciferol (VITAMIN D) 1000 units tablet Take 1,000 Units by mouth daily.    [name] escitalopram (LEXAPRO) 10 MG tablet Take 10 mg by mouth daily.     [name] ibuprofen (ADVIL,MOTRIN) 200 MG tablet Take 400 mg by mouth every 6 (six) hours as needed for headache or moderate pain.     [name] prochlorperazine (COMPAZINE) 10 MG tablet Take 1 tablet (10 mg total) by mouth every 6 (six) hours as needed for nausea or vomiting. 30 tablet 0   No current facility-administered medications for this visit.        Physical Exam: BP (!) 102/58   Pulse 70   Resp 20   Ht 5\' 5"  (1.651 m)   Wt 119 lb (54 kg)   SpO2 97% Comment: RA  BMI 19.80 kg/m  General appearance: alert, cooperative and no distress Head: Normocephalic, without obvious abnormality, atraumatic Lymph nodes: Cervical, supraclavicular, and axillary nodes normal. Resp: clear to auscultation  bilaterally Cardio: regular rate and rhythm, S1, S2 normal, no murmur, click, rub or gallop GI: soft, non-tender; bowel sounds normal; no masses,  no organomegaly Extremities: extremities normal, atraumatic, no cyanosis or edema and Homans sign is negative, no sign of DVT Neurologic: Grossly normal   Diagnostic Studies & Laboratory data:         Recent Radiology Findings:   Dg Chest 2 View  Result Date: 11/13/2017 CLINICAL DATA:  Small cell carcinoma of the left lower lobe. EXAM: CHEST - 2 VIEW COMPARISON:  10/16/2017, 10/04/2017, 10/03/2017. FINDINGS: Mediastinum and hilar structures are normal. Heart size normal. Persistent left base atelectatic changes and/or infiltrate. Persistent left-sided pleural effusion. Slight improvement findings from prior exam. Right lung is clear. Heart size normal. Thoracic spine scoliosis and degenerative change. IMPRESSION: Persistent left base atelectatic changes and/or infiltrate. Persistent left-sided pleural effusion. Slight improvement in findings from prior exam. Continued follow-up exams to demonstrate clearing suggested. Electronically Signed   By: Marcello Moores  Register   On: 11/13/2017 12:20      I have independently reviewed the above radiology findings and reviewed findings  with the patient.  Recent Labs: Lab Results  Component Value Date   WBC 3.9 11/11/2017   HGB 11.8 11/11/2017   HCT 35.4 11/11/2017   PLT 202 11/11/2017   GLUCOSE 94 11/11/2017   ALT 17 11/11/2017   AST 19 11/11/2017   NA 138 11/11/2017   K 4.3 11/11/2017   CL 102 11/11/2017   CREATININE 0.83 11/11/2017   BUN 16 11/11/2017   CO2 26 11/11/2017   INR 0.98 09/25/2017      Assessment / Plan:    Patient progressing well following left lower lobectomy for what turned out to be small cell carcinoma of the lung, is a peripheral nodule From a surgical point of view the patient is making good progress Currently started round 1 of 4  of postoperative chemotherapy Plan to  see the patient back in 3 months Grace Isaac 11/13/2017 12:48 PM

## 2017-11-18 ENCOUNTER — Telehealth: Payer: Self-pay | Admitting: Medical Oncology

## 2017-11-18 ENCOUNTER — Inpatient Hospital Stay: Payer: 59

## 2017-11-18 DIAGNOSIS — Z5111 Encounter for antineoplastic chemotherapy: Secondary | ICD-10-CM | POA: Diagnosis not present

## 2017-11-18 DIAGNOSIS — C3432 Malignant neoplasm of lower lobe, left bronchus or lung: Secondary | ICD-10-CM

## 2017-11-18 LAB — COMPREHENSIVE METABOLIC PANEL
ALT: 15 U/L (ref 0–55)
AST: 16 U/L (ref 5–34)
Albumin: 4 g/dL (ref 3.5–5.0)
Alkaline Phosphatase: 73 U/L (ref 40–150)
Anion gap: 9 (ref 3–11)
BUN: 9 mg/dL (ref 7–26)
CO2: 26 mmol/L (ref 22–29)
Calcium: 9.5 mg/dL (ref 8.4–10.4)
Chloride: 104 mmol/L (ref 98–109)
Creatinine, Ser: 0.79 mg/dL (ref 0.60–1.10)
GFR calc Af Amer: >60 mL/min (ref >60)
GFR calc non Af Amer: >60 mL/min (ref >60)
Glucose, Bld: 111 mg/dL (ref 70–140)
Potassium: 3.8 mmol/L (ref 3.5–5.1)
Sodium: 139 mmol/L (ref 136–145)
Total Bilirubin: <0.2 mg/dL — ABNORMAL LOW (ref 0.2–1.2)
Total Protein: 6.9 g/dL (ref 6.4–8.3)

## 2017-11-18 LAB — CBC WITH DIFFERENTIAL (CANCER CENTER ONLY)
Basophils Absolute: 0 10*3/uL (ref 0.0–0.1)
Basophils Relative: 1 %
Eosinophils Absolute: 0 10*3/uL (ref 0.0–0.5)
Eosinophils Relative: 2 %
HCT: 31.1 % — ABNORMAL LOW (ref 34.8–46.6)
Hemoglobin: 10.4 g/dL — ABNORMAL LOW (ref 11.6–15.9)
Lymphocytes Relative: 67 %
Lymphs Abs: 1 10*3/uL (ref 0.9–3.3)
MCH: 29.6 pg (ref 25.1–34.0)
MCHC: 33.3 g/dL (ref 31.5–36.0)
MCV: 88.7 fL (ref 79.5–101.0)
Monocytes Absolute: 0.2 10*3/uL (ref 0.1–0.9)
Monocytes Relative: 13 %
Neutro Abs: 0.3 10*3/uL — CL (ref 1.5–6.5)
Neutrophils Relative %: 17 %
Platelet Count: 116 10*3/uL — ABNORMAL LOW (ref 145–400)
RBC: 3.51 MIL/uL — ABNORMAL LOW (ref 3.70–5.45)
RDW: 13.7 % (ref 11.2–14.5)
WBC Count: 1.5 10*3/uL — ABNORMAL LOW (ref 3.9–10.3)

## 2017-11-18 LAB — MAGNESIUM: Magnesium: 1.7 mg/dL (ref 1.7–2.4)

## 2017-11-18 NOTE — Telephone Encounter (Addendum)
Reviewed neutropenic precautions and when to call.

## 2017-11-18 NOTE — Telephone Encounter (Signed)
Left message for pt to return call. She needs Neutropenic precautions.

## 2017-11-24 ENCOUNTER — Inpatient Hospital Stay: Payer: 59

## 2017-11-24 ENCOUNTER — Encounter: Payer: Self-pay | Admitting: Nurse Practitioner

## 2017-11-24 ENCOUNTER — Inpatient Hospital Stay: Payer: 59 | Attending: Internal Medicine | Admitting: Nurse Practitioner

## 2017-11-24 VITALS — BP 138/52 | HR 72 | Temp 98.7°F | Resp 17 | Ht 65.0 in | Wt 123.3 lb

## 2017-11-24 DIAGNOSIS — C3432 Malignant neoplasm of lower lobe, left bronchus or lung: Secondary | ICD-10-CM

## 2017-11-24 DIAGNOSIS — Z5189 Encounter for other specified aftercare: Secondary | ICD-10-CM | POA: Insufficient documentation

## 2017-11-24 DIAGNOSIS — D701 Agranulocytosis secondary to cancer chemotherapy: Secondary | ICD-10-CM | POA: Diagnosis not present

## 2017-11-24 DIAGNOSIS — Z5111 Encounter for antineoplastic chemotherapy: Secondary | ICD-10-CM | POA: Insufficient documentation

## 2017-11-24 LAB — CBC WITH DIFFERENTIAL (CANCER CENTER ONLY)
Basophils Absolute: 0 10*3/uL (ref 0.0–0.1)
Basophils Relative: 1 %
Eosinophils Absolute: 0 10*3/uL (ref 0.0–0.5)
Eosinophils Relative: 1 %
HCT: 32.9 % — ABNORMAL LOW (ref 34.8–46.6)
Hemoglobin: 11.1 g/dL — ABNORMAL LOW (ref 11.6–15.9)
Lymphocytes Relative: 55 %
Lymphs Abs: 1.3 10*3/uL (ref 0.9–3.3)
MCH: 30.4 pg (ref 25.1–34.0)
MCHC: 33.8 g/dL (ref 31.5–36.0)
MCV: 90 fL (ref 79.5–101.0)
Monocytes Absolute: 0.6 10*3/uL (ref 0.1–0.9)
Monocytes Relative: 24 %
Neutro Abs: 0.4 10*3/uL — CL (ref 1.5–6.5)
Neutrophils Relative %: 19 %
Platelet Count: 331 10*3/uL (ref 145–400)
RBC: 3.66 MIL/uL — ABNORMAL LOW (ref 3.70–5.45)
RDW: 14.7 % — ABNORMAL HIGH (ref 11.2–14.5)
WBC Count: 2.3 10*3/uL — ABNORMAL LOW (ref 3.9–10.3)

## 2017-11-24 LAB — CMP (CANCER CENTER ONLY)
ALT: 12 U/L (ref 0–55)
AST: 17 U/L (ref 5–34)
Albumin: 4.1 g/dL (ref 3.5–5.0)
Alkaline Phosphatase: 81 U/L (ref 40–150)
Anion gap: 11 (ref 3–11)
BUN: 7 mg/dL (ref 7–26)
CO2: 25 mmol/L (ref 22–29)
Calcium: 9.5 mg/dL (ref 8.4–10.4)
Chloride: 104 mmol/L (ref 98–109)
Creatinine: 0.85 mg/dL (ref 0.60–1.10)
GFR, Est AFR Am: >60 mL/min (ref >60)
GFR, Estimated: >60 mL/min (ref >60)
Glucose, Bld: 121 mg/dL (ref 70–140)
Potassium: 3.9 mmol/L (ref 3.5–5.1)
Sodium: 140 mmol/L (ref 136–145)
Total Bilirubin: <0.2 mg/dL — ABNORMAL LOW (ref 0.2–1.2)
Total Protein: 7.2 g/dL (ref 6.4–8.3)

## 2017-11-24 LAB — MAGNESIUM: Magnesium: 1.7 mg/dL (ref 1.7–2.4)

## 2017-11-24 MED ORDER — FILGRASTIM 300 MCG/0.5ML IJ SOSY: 300.0000 ug | PREFILLED_SYRINGE | Freq: Once | INTRAMUSCULAR | Status: DC

## 2017-11-24 MED ORDER — TBO-FILGRASTIM 300 MCG/0.5ML ~~LOC~~ SOSY
300.0000 ug | PREFILLED_SYRINGE | Freq: Once | SUBCUTANEOUS | Status: AC
  Administered 2017-11-24: 300 ug via SUBCUTANEOUS

## 2017-11-24 MED ORDER — TBO-FILGRASTIM 300 MCG/0.5ML ~~LOC~~ SOSY
PREFILLED_SYRINGE | SUBCUTANEOUS | Status: AC
  Filled 2017-11-24: qty 0.5

## 2017-11-24 NOTE — Progress Notes (Addendum)
[facility] OFFICE PROGRESS NOTE   DIAGNOSIS:Limited stage (T2 a,N0, M0) small cell lung cancer presented with left lower lobe lung mass status post left lower lobectomy with lymph node dissection on September 30, 2017.  PRIOR THERAPY:None.  CURRENT THERAPY:Adjuvant systemic chemotherapy was 4 cycles of cisplatin 80 mg/M2 on day 1 and etoposide 100 mg/M2 on days 1, 2 and3every 3 weeks.Cycle 1 beginning 11/04/2017     INTERVAL HISTORY:   Ms. Arenson returns as scheduled.  She completed cycle 1 cisplatin/etoposide beginning 11/04/2017.  She had nausea beginning day 3.  No vomiting.  The nausea was relieved with Compazine.  No mouth sores.  No diarrhea.  No numbness or tingling in the hands or feet.  No tinnitus.  She denies shortness of breath.  No rash.  She notes hair loss.  Objective:  Vital signs in last 24 hours:  Blood pressure (!) 138/52, pulse 72, temperature 98.7 F (37.1 C), temperature source Oral, resp. rate 17, height 5\' 5"  (1.651 m), weight 123 lb 4.8 oz (55.9 kg), SpO2 100 %.    HEENT: No thrush or ulcers. Resp: Lungs clear bilaterally. Cardio: Regular rate and rhythm. GI: Abdomen soft and nontender.  No hepatomegaly. Vascular: No leg edema. Neuro: Alert and oriented. Skin: No rash.   Lab Results:  Lab Results  Component Value Date   WBC 2.3 (L) 11/24/2017   HGB 11.1 (L) 11/24/2017   HCT 32.9 (L) 11/24/2017   MCV 90.0 11/24/2017   PLT 331 11/24/2017   NEUTROABS 0.4 (LL) 11/24/2017    Imaging:  No results found.  Medications: I have reviewed the patient's current medications.  Assessment/Plan: 1. Small cell carcinoma of the lower lobe of left lung, limited stage (T2 N0 M0) status post cycle 1 cisplatin/etoposide 11/04/2017. 2. Neutropenia secondary to chemotherapy 11/24/2017.  Disposition: Ms. Sundby appears stable.  She has completed 1 cycle of cisplatin/etoposide.  She continues to have neutropenia.  Dr. Julien Nordmann spoke with her and  recommends Neupogen 300 mcg daily for 2 days.  Dr. Julien Nordmann also recommends Neulasta beginning with cycle 2.  We reviewed potential toxicities associated with Neupogen and Neulasta.  She would like to speak with the [facility] pharmacist prior to making a decision.  We reviewed neutropenic precautions.  She understands to contact the office with fever, chills, other signs of infection.  Treatment is being held today and rescheduled to begin in 1 week.  She will contact the office prior to her next visit as outlined above or with any other problems.  Patient seen with Dr. Julien Nordmann.    Ned Card ANP/GNP-BC   11/24/2017  10:01 AM    ADDENDUM: Hematology/Oncology Attending: I had a face-to-face encounter with the patient today.  I recommended her care plan.  This is a very pleasant 62 years old white female with limited stage small cell lung cancer status post left lower lobectomy and she is currently undergoing adjuvant treatment with cisplatin and etoposide status post 1 cycle.  The first cycle of her treatment is complicated with significant neutropenia that has not recovered over the last few weeks. I had a lengthy discussion with the patient and her husband about her current condition and treatment. We will delay her treatment until next week. I recommended for the patient to proceed with Granix injections 300 mcg subcutaneously for 2 days to improve her blood count.  I also recommend for the patient to consider treatment with Neulasta after her next cycle of the chemotherapy.  She  was a little bit reluctant about this option because of concern about the toxicity of Neulasta.  We will discuss this with her and more details after her next treatment.  She was advised to call immediately if she has any concerning symptoms in the interval.  Disclaimer: This note was dictated with voice recognition software. Similar sounding words can inadvertently be transcribed and may be missed upon  review. Eilleen Kempf, MD 11/24/17

## 2017-11-25 ENCOUNTER — Inpatient Hospital Stay: Payer: 59

## 2017-11-25 ENCOUNTER — Other Ambulatory Visit: Payer: Self-pay | Admitting: Nurse Practitioner

## 2017-11-25 DIAGNOSIS — C3432 Malignant neoplasm of lower lobe, left bronchus or lung: Secondary | ICD-10-CM

## 2017-11-25 DIAGNOSIS — Z5111 Encounter for antineoplastic chemotherapy: Secondary | ICD-10-CM | POA: Diagnosis not present

## 2017-11-25 MED ORDER — TBO-FILGRASTIM 300 MCG/0.5ML ~~LOC~~ SOSY
PREFILLED_SYRINGE | SUBCUTANEOUS | Status: AC
  Filled 2017-11-25: qty 0.5

## 2017-11-25 MED ORDER — TBO-FILGRASTIM 300 MCG/0.5ML ~~LOC~~ SOSY
300.0000 ug | PREFILLED_SYRINGE | Freq: Once | SUBCUTANEOUS | Status: AC
  Administered 2017-11-25: 300 ug via SUBCUTANEOUS

## 2017-11-26 ENCOUNTER — Ambulatory Visit: Payer: 59

## 2017-12-01 ENCOUNTER — Other Ambulatory Visit: Payer: 59

## 2017-12-02 ENCOUNTER — Inpatient Hospital Stay: Payer: 59

## 2017-12-02 ENCOUNTER — Ambulatory Visit: Payer: 59

## 2017-12-02 ENCOUNTER — Other Ambulatory Visit: Payer: Self-pay | Admitting: Internal Medicine

## 2017-12-02 VITALS — BP 115/52 | HR 69 | Temp 98.6°F | Resp 18

## 2017-12-02 DIAGNOSIS — C3432 Malignant neoplasm of lower lobe, left bronchus or lung: Secondary | ICD-10-CM

## 2017-12-02 DIAGNOSIS — Z5111 Encounter for antineoplastic chemotherapy: Secondary | ICD-10-CM | POA: Diagnosis not present

## 2017-12-02 LAB — CBC WITH DIFFERENTIAL (CANCER CENTER ONLY)
Basophils Absolute: 0 10*3/uL (ref 0.0–0.1)
Basophils Relative: 1 %
Eosinophils Absolute: 0 10*3/uL (ref 0.0–0.5)
Eosinophils Relative: 1 %
HCT: 35.8 % (ref 34.8–46.6)
Hemoglobin: 11.6 g/dL (ref 11.6–15.9)
Lymphocytes Relative: 29 %
Lymphs Abs: 1.5 10*3/uL (ref 0.9–3.3)
MCH: 29.6 pg (ref 25.1–34.0)
MCHC: 32.4 g/dL (ref 31.5–36.0)
MCV: 91.3 fL (ref 79.5–101.0)
Monocytes Absolute: 0.6 10*3/uL (ref 0.1–0.9)
Monocytes Relative: 12 %
Neutro Abs: 3.1 10*3/uL (ref 1.5–6.5)
Neutrophils Relative %: 57 %
Platelet Count: 237 10*3/uL (ref 145–400)
RBC: 3.92 MIL/uL (ref 3.70–5.45)
RDW: 15.3 % — ABNORMAL HIGH (ref 11.2–14.5)
WBC Count: 5.3 10*3/uL (ref 3.9–10.3)

## 2017-12-02 LAB — CMP (CANCER CENTER ONLY)
ALT: 13 U/L (ref 0–55)
AST: 15 U/L (ref 5–34)
Albumin: 4.1 g/dL (ref 3.5–5.0)
Alkaline Phosphatase: 80 U/L (ref 40–150)
Anion gap: 11 (ref 3–11)
BUN: 13 mg/dL (ref 7–26)
CO2: 24 mmol/L (ref 22–29)
Calcium: 9.6 mg/dL (ref 8.4–10.4)
Chloride: 105 mmol/L (ref 98–109)
Creatinine: 0.82 mg/dL (ref 0.60–1.10)
GFR, Est AFR Am: >60 mL/min (ref >60)
GFR, Estimated: >60 mL/min (ref >60)
Glucose, Bld: 119 mg/dL (ref 70–140)
Potassium: 3.9 mmol/L (ref 3.5–5.1)
Sodium: 140 mmol/L (ref 136–145)
Total Bilirubin: 0.2 mg/dL (ref 0.2–1.2)
Total Protein: 7.1 g/dL (ref 6.4–8.3)

## 2017-12-02 LAB — MAGNESIUM: Magnesium: 1.8 mg/dL (ref 1.7–2.4)

## 2017-12-02 MED ORDER — POTASSIUM CHLORIDE 2 MEQ/ML IV SOLN
Freq: Once | INTRAVENOUS | Status: AC
  Administered 2017-12-02: 08:00:00 via INTRAVENOUS
  Filled 2017-12-02: qty 10

## 2017-12-02 MED ORDER — SODIUM CHLORIDE 0.9 % IV SOLN
100.0000 mg/m2 | Freq: Once | INTRAVENOUS | Status: AC
  Administered 2017-12-02: 160 mg via INTRAVENOUS
  Filled 2017-12-02: qty 8

## 2017-12-02 MED ORDER — SODIUM CHLORIDE 0.9 % IV SOLN
Freq: Once | INTRAVENOUS | Status: AC
  Administered 2017-12-02: 13:00:00 via INTRAVENOUS
  Filled 2017-12-02: qty 5

## 2017-12-02 MED ORDER — SODIUM CHLORIDE 0.9 % IV SOLN
80.0000 mg/m2 | Freq: Once | INTRAVENOUS | Status: AC
  Administered 2017-12-02: 130 mg via INTRAVENOUS
  Filled 2017-12-02: qty 130

## 2017-12-02 MED ORDER — PALONOSETRON HCL INJECTION 0.25 MG/5ML
0.2500 mg | Freq: Once | INTRAVENOUS | Status: AC
  Administered 2017-12-02: 0.25 mg via INTRAVENOUS

## 2017-12-02 MED ORDER — PALONOSETRON HCL INJECTION 0.25 MG/5ML
INTRAVENOUS | Status: AC
  Filled 2017-12-02: qty 5

## 2017-12-02 MED ORDER — SODIUM CHLORIDE 0.9 % IV SOLN
Freq: Once | INTRAVENOUS | Status: AC
  Administered 2017-12-02: 12:00:00 via INTRAVENOUS

## 2017-12-02 NOTE — Progress Notes (Signed)
Pt declines Neulasta. She would prefer Granix 361mcg x 2 days the week before her next scheduled treatment. Spoke with MD. Will check CBC on 12/22/17 and give Granix if needed based on her CBC.  B.Amora Sheehy, PharmD, BCPS, BCOP

## 2017-12-02 NOTE — Patient Instructions (Signed)
[facility] Discharge Instructions for Patients Receiving Chemotherapy  Today you received the following chemotherapy agents Cisplatin and Etoposide  To help prevent nausea and vomiting after your treatment, we encourage you to take your nausea medication as directed   If you develop nausea and vomiting that is not controlled by your nausea medication, call the clinic.   BELOW ARE SYMPTOMS THAT SHOULD BE REPORTED IMMEDIATELY:  *FEVER GREATER THAN 100.5 F  *CHILLS WITH OR WITHOUT FEVER  NAUSEA AND VOMITING THAT IS NOT CONTROLLED WITH YOUR NAUSEA MEDICATION  *UNUSUAL SHORTNESS OF BREATH  *UNUSUAL BRUISING OR BLEEDING  TENDERNESS IN MOUTH AND THROAT WITH OR WITHOUT PRESENCE OF ULCERS  *URINARY PROBLEMS  *BOWEL PROBLEMS  UNUSUAL RASH Items with * indicate a potential emergency and should be followed up as soon as possible.  Feel free to call the clinic should you have any questions or concerns. The clinic phone number is [phone].  Please show the [facility] at check-in to the Emergency Department and triage nurse.

## 2017-12-02 NOTE — Progress Notes (Signed)
Clarification regarding Granix/Neulasta orders. Pt declines Neulasta. She is not currently scheduled for weekly labs. Weekly lab appts reordered today - message sent to scheduling dept per MD order from 10/27/17. Patient will be scheduled for lab appt on 6/17 and 6/24. Her CBC will be reviewed on 6/24 and Granix 363mcg will be ordered if CBC indicates as patient declines Neulasta and prefers not take injections if she doesn't need them. She is scheduled to start her third cycle on 12/22/17. Above plan with with Dr. Julien Nordmann.  Hardie Pulley, PharmD, BCPS, BCOP

## 2017-12-03 ENCOUNTER — Inpatient Hospital Stay: Payer: 59

## 2017-12-03 ENCOUNTER — Ambulatory Visit: Payer: 59

## 2017-12-03 VITALS — BP 139/58 | HR 66 | Temp 98.6°F | Resp 19

## 2017-12-03 DIAGNOSIS — C3432 Malignant neoplasm of lower lobe, left bronchus or lung: Secondary | ICD-10-CM

## 2017-12-03 DIAGNOSIS — Z5111 Encounter for antineoplastic chemotherapy: Secondary | ICD-10-CM | POA: Diagnosis not present

## 2017-12-03 MED ORDER — SODIUM CHLORIDE 0.9 % IV SOLN
Freq: Once | INTRAVENOUS | Status: AC
  Administered 2017-12-03: 08:00:00 via INTRAVENOUS

## 2017-12-03 MED ORDER — DEXAMETHASONE SODIUM PHOSPHATE 10 MG/ML IJ SOLN
INTRAMUSCULAR | Status: AC
  Filled 2017-12-03: qty 1

## 2017-12-03 MED ORDER — SODIUM CHLORIDE 0.9 % IV SOLN
100.0000 mg/m2 | Freq: Once | INTRAVENOUS | Status: AC
  Administered 2017-12-03: 160 mg via INTRAVENOUS
  Filled 2017-12-03: qty 8

## 2017-12-03 MED ORDER — DEXAMETHASONE SODIUM PHOSPHATE 10 MG/ML IJ SOLN
10.0000 mg | Freq: Once | INTRAMUSCULAR | Status: AC
  Administered 2017-12-03: 10 mg via INTRAVENOUS

## 2017-12-03 NOTE — Patient Instructions (Signed)
[facility] Discharge Instructions for Patients Receiving Chemotherapy  Today you received the following chemotherapy agents Etoposide  To help prevent nausea and vomiting after your treatment, we encourage you to take your nausea medication as directed   If you develop nausea and vomiting that is not controlled by your nausea medication, call the clinic.   BELOW ARE SYMPTOMS THAT SHOULD BE REPORTED IMMEDIATELY:  *FEVER GREATER THAN 100.5 F  *CHILLS WITH OR WITHOUT FEVER  NAUSEA AND VOMITING THAT IS NOT CONTROLLED WITH YOUR NAUSEA MEDICATION  *UNUSUAL SHORTNESS OF BREATH  *UNUSUAL BRUISING OR BLEEDING  TENDERNESS IN MOUTH AND THROAT WITH OR WITHOUT PRESENCE OF ULCERS  *URINARY PROBLEMS  *BOWEL PROBLEMS  UNUSUAL RASH Items with * indicate a potential emergency and should be followed up as soon as possible.  Feel free to call the clinic should you have any questions or concerns. The clinic phone number is [phone].  Please show the [place] at check-in to the Emergency Department and triage nurse.

## 2017-12-04 ENCOUNTER — Inpatient Hospital Stay: Payer: 59

## 2017-12-04 VITALS — BP 143/72 | HR 59 | Temp 98.5°F | Resp 18

## 2017-12-04 DIAGNOSIS — Z5111 Encounter for antineoplastic chemotherapy: Secondary | ICD-10-CM | POA: Diagnosis not present

## 2017-12-04 DIAGNOSIS — C3432 Malignant neoplasm of lower lobe, left bronchus or lung: Secondary | ICD-10-CM

## 2017-12-04 MED ORDER — DEXAMETHASONE SODIUM PHOSPHATE 10 MG/ML IJ SOLN
INTRAMUSCULAR | Status: AC
  Filled 2017-12-04: qty 1

## 2017-12-04 MED ORDER — SODIUM CHLORIDE 0.9 % IV SOLN
Freq: Once | INTRAVENOUS | Status: AC
  Administered 2017-12-04: 09:00:00 via INTRAVENOUS

## 2017-12-04 MED ORDER — DEXAMETHASONE SODIUM PHOSPHATE 10 MG/ML IJ SOLN
10.0000 mg | Freq: Once | INTRAMUSCULAR | Status: AC
  Administered 2017-12-04: 10 mg via INTRAVENOUS

## 2017-12-04 MED ORDER — ETOPOSIDE CHEMO INJECTION 1 GM/50ML
100.0000 mg/m2 | Freq: Once | INTRAVENOUS | Status: AC
Start: 2017-12-04 — End: 2017-12-04
  Administered 2017-12-04: 160 mg via INTRAVENOUS
  Filled 2017-12-04: qty 8

## 2017-12-04 NOTE — Patient Instructions (Signed)
[facility] Discharge Instructions for Patients Receiving Chemotherapy  Today you received the following chemotherapy agents Etoposide  To help prevent nausea and vomiting after your treatment, we encourage you to take your nausea medication as directed   If you develop nausea and vomiting that is not controlled by your nausea medication, call the clinic.   BELOW ARE SYMPTOMS THAT SHOULD BE REPORTED IMMEDIATELY:  *FEVER GREATER THAN 100.5 F  *CHILLS WITH OR WITHOUT FEVER  NAUSEA AND VOMITING THAT IS NOT CONTROLLED WITH YOUR NAUSEA MEDICATION  *UNUSUAL SHORTNESS OF BREATH  *UNUSUAL BRUISING OR BLEEDING  TENDERNESS IN MOUTH AND THROAT WITH OR WITHOUT PRESENCE OF ULCERS  *URINARY PROBLEMS  *BOWEL PROBLEMS  UNUSUAL RASH Items with * indicate a potential emergency and should be followed up as soon as possible.  Feel free to call the clinic should you have any questions or concerns. The clinic phone number is [phone].  Please show the [place] at check-in to the Emergency Department and triage nurse.

## 2017-12-08 ENCOUNTER — Other Ambulatory Visit: Payer: 59

## 2017-12-08 ENCOUNTER — Inpatient Hospital Stay: Payer: 59

## 2017-12-08 DIAGNOSIS — Z5111 Encounter for antineoplastic chemotherapy: Secondary | ICD-10-CM | POA: Diagnosis not present

## 2017-12-08 DIAGNOSIS — C3432 Malignant neoplasm of lower lobe, left bronchus or lung: Secondary | ICD-10-CM

## 2017-12-08 LAB — CBC WITH DIFFERENTIAL (CANCER CENTER ONLY)
Basophils Absolute: 0.1 10*3/uL (ref 0.0–0.1)
Basophils Relative: 1 %
Eosinophils Absolute: 0 10*3/uL (ref 0.0–0.5)
Eosinophils Relative: 0 %
HCT: 35.9 % (ref 34.8–46.6)
Hemoglobin: 12 g/dL (ref 11.6–15.9)
Lymphocytes Relative: 32 %
Lymphs Abs: 1.2 10*3/uL (ref 0.9–3.3)
MCH: 29.6 pg (ref 25.1–34.0)
MCHC: 33.5 g/dL (ref 31.5–36.0)
MCV: 88.3 fL (ref 79.5–101.0)
Monocytes Absolute: 0.1 10*3/uL (ref 0.1–0.9)
Monocytes Relative: 2 %
Neutro Abs: 2.5 10*3/uL (ref 1.5–6.5)
Neutrophils Relative %: 65 %
Platelet Count: 229 10*3/uL (ref 145–400)
RBC: 4.07 MIL/uL (ref 3.70–5.45)
RDW: 16.2 % — ABNORMAL HIGH (ref 11.2–14.5)
WBC Count: 3.9 10*3/uL (ref 3.9–10.3)

## 2017-12-08 LAB — CMP (CANCER CENTER ONLY)
ALT: 12 U/L (ref 0–55)
AST: 15 U/L (ref 5–34)
Albumin: 4.5 g/dL (ref 3.5–5.0)
Alkaline Phosphatase: 68 U/L (ref 40–150)
Anion gap: 10 (ref 3–11)
BUN: 15 mg/dL (ref 7–26)
CO2: 26 mmol/L (ref 22–29)
Calcium: 10.1 mg/dL (ref 8.4–10.4)
Chloride: 100 mmol/L (ref 98–109)
Creatinine: 0.84 mg/dL (ref 0.60–1.10)
GFR, Est AFR Am: >60 mL/min (ref >60)
GFR, Estimated: >60 mL/min (ref >60)
Glucose, Bld: 100 mg/dL (ref 70–140)
Potassium: 4.8 mmol/L (ref 3.5–5.1)
Sodium: 136 mmol/L (ref 136–145)
Total Bilirubin: 0.4 mg/dL (ref 0.2–1.2)
Total Protein: 7.3 g/dL (ref 6.4–8.3)

## 2017-12-08 LAB — MAGNESIUM: Magnesium: 1.8 mg/dL (ref 1.7–2.4)

## 2017-12-15 ENCOUNTER — Inpatient Hospital Stay: Payer: 59

## 2017-12-15 ENCOUNTER — Other Ambulatory Visit: Payer: 59

## 2017-12-15 ENCOUNTER — Ambulatory Visit: Payer: 59

## 2017-12-15 ENCOUNTER — Ambulatory Visit: Payer: 59 | Admitting: Oncology

## 2017-12-15 DIAGNOSIS — Z5111 Encounter for antineoplastic chemotherapy: Secondary | ICD-10-CM | POA: Diagnosis not present

## 2017-12-15 DIAGNOSIS — C3432 Malignant neoplasm of lower lobe, left bronchus or lung: Secondary | ICD-10-CM

## 2017-12-15 LAB — CMP (CANCER CENTER ONLY)
ALT: 10 U/L (ref 0–55)
AST: 16 U/L (ref 5–34)
Albumin: 4.2 g/dL (ref 3.5–5.0)
Alkaline Phosphatase: 64 U/L (ref 40–150)
Anion gap: 9 (ref 3–11)
BUN: 13 mg/dL (ref 7–26)
CO2: 27 mmol/L (ref 22–29)
Calcium: 9.4 mg/dL (ref 8.4–10.4)
Chloride: 103 mmol/L (ref 98–109)
Creatinine: 0.83 mg/dL (ref 0.60–1.10)
GFR, Est AFR Am: >60 mL/min (ref >60)
GFR, Estimated: >60 mL/min (ref >60)
Glucose, Bld: 90 mg/dL (ref 70–140)
Potassium: 4.4 mmol/L (ref 3.5–5.1)
Sodium: 139 mmol/L (ref 136–145)
Total Bilirubin: <0.2 mg/dL — ABNORMAL LOW (ref 0.2–1.2)
Total Protein: 6.7 g/dL (ref 6.4–8.3)

## 2017-12-15 LAB — CBC WITH DIFFERENTIAL (CANCER CENTER ONLY)
Basophils Absolute: 0 10*3/uL (ref 0.0–0.1)
Basophils Relative: 1 %
Eosinophils Absolute: 0 10*3/uL (ref 0.0–0.5)
Eosinophils Relative: 1 %
HCT: 28.9 % — ABNORMAL LOW (ref 34.8–46.6)
Hemoglobin: 9.6 g/dL — ABNORMAL LOW (ref 11.6–15.9)
Lymphocytes Relative: 62 %
Lymphs Abs: 1.3 10*3/uL (ref 0.9–3.3)
MCH: 29.3 pg (ref 25.1–34.0)
MCHC: 33.2 g/dL (ref 31.5–36.0)
MCV: 88.1 fL (ref 79.5–101.0)
Monocytes Absolute: 0.2 10*3/uL (ref 0.1–0.9)
Monocytes Relative: 11 %
Neutro Abs: 0.6 10*3/uL — ABNORMAL LOW (ref 1.5–6.5)
Neutrophils Relative %: 25 %
Platelet Count: 86 10*3/uL — ABNORMAL LOW (ref 145–400)
RBC: 3.28 MIL/uL — ABNORMAL LOW (ref 3.70–5.45)
RDW: 15.3 % — ABNORMAL HIGH (ref 11.2–14.5)
WBC Count: 2.2 10*3/uL — ABNORMAL LOW (ref 3.9–10.3)

## 2017-12-15 LAB — MAGNESIUM: Magnesium: 1.8 mg/dL (ref 1.7–2.4)

## 2017-12-16 ENCOUNTER — Ambulatory Visit: Payer: 59

## 2017-12-17 ENCOUNTER — Ambulatory Visit: Payer: 59

## 2017-12-18 ENCOUNTER — Other Ambulatory Visit: Payer: Self-pay | Admitting: Medical Oncology

## 2017-12-18 ENCOUNTER — Telehealth: Payer: Self-pay | Admitting: Medical Oncology

## 2017-12-18 ENCOUNTER — Inpatient Hospital Stay: Payer: 59

## 2017-12-18 DIAGNOSIS — D701 Agranulocytosis secondary to cancer chemotherapy: Secondary | ICD-10-CM

## 2017-12-18 DIAGNOSIS — T451X5A Adverse effect of antineoplastic and immunosuppressive drugs, initial encounter: Principal | ICD-10-CM

## 2017-12-18 DIAGNOSIS — Z5111 Encounter for antineoplastic chemotherapy: Secondary | ICD-10-CM | POA: Diagnosis not present

## 2017-12-18 MED ORDER — TBO-FILGRASTIM 300 MCG/0.5ML ~~LOC~~ SOSY
300.0000 ug | PREFILLED_SYRINGE | Freq: Once | SUBCUTANEOUS | Status: AC
  Administered 2017-12-18: 300 ug via SUBCUTANEOUS

## 2017-12-18 MED ORDER — TBO-FILGRASTIM 300 MCG/0.5ML ~~LOC~~ SOSY
PREFILLED_SYRINGE | SUBCUTANEOUS | Status: AC
  Filled 2017-12-18: qty 0.5

## 2017-12-18 NOTE — Telephone Encounter (Signed)
Pt notified to come in for granix.today and tomorrow. message sent.

## 2017-12-18 NOTE — Patient Instructions (Signed)
Tbo-Filgrastim injection What is this medicine? TBO-FILGRASTIM (T B O fil GRA stim) is a granulocyte colony-stimulating factor that stimulates the growth of neutrophils, a type of white blood cell important in the body's fight against infection. It is used to reduce the incidence of fever and infection in patients with certain types of cancer who are receiving chemotherapy that affects the bone marrow. This medicine may be used for other purposes; ask your health care provider or pharmacist if you have questions. COMMON BRAND NAME(S): Granix What should I tell my health care provider before I take this medicine? They need to know if you have any of these conditions: -bone scan or tests planned -kidney disease -sickle cell anemia -an unusual or allergic reaction to tbo-filgrastim, filgrastim, pegfilgrastim, other medicines, foods, dyes, or preservatives -pregnant or trying to get pregnant -breast-feeding How should I use this medicine? This medicine is for injection under the skin. If you get this medicine at home, you will be taught how to prepare and give this medicine. Refer to the Instructions for Use that come with your medication packaging. Use exactly as directed. Take your medicine at regular intervals. Do not take your medicine more often than directed. It is important that you put your used needles and syringes in a special sharps container. Do not put them in a trash can. If you do not have a sharps container, call your pharmacist or healthcare provider to get one. Talk to your pediatrician regarding the use of this medicine in children. Special care may be needed. Overdosage: If you think you have taken too much of this medicine contact a poison control center or emergency room at once. NOTE: This medicine is only for you. Do not share this medicine with others. What if I miss a dose? It is important not to miss your dose. Call your doctor or health care professional if you miss a  dose. What may interact with this medicine? This medicine may interact with the following medications: -medicines that may cause a release of neutrophils, such as lithium This list may not describe all possible interactions. Give your health care provider a list of all the medicines, herbs, non-prescription drugs, or dietary supplements you use. Also tell them if you smoke, drink alcohol, or use illegal drugs. Some items may interact with your medicine. What should I watch for while using this medicine? You may need blood work done while you are taking this medicine. What side effects may I notice from receiving this medicine? Side effects that you should report to your doctor or health care professional as soon as possible: -allergic reactions like skin rash, itching or hives, swelling of the face, lips, or tongue -blood in the urine -dark urine -dizziness -fast heartbeat -feeling faint -shortness of breath or breathing problems -signs and symptoms of infection like fever or chills; cough; or sore throat -signs and symptoms of kidney injury like trouble passing urine or change in the amount of urine -stomach or side pain, or pain at the shoulder -sweating -swelling of the legs, ankles, or abdomen -tiredness Side effects that usually do not require medical attention (report to your doctor or health care professional if they continue or are bothersome): -bone pain -headache -muscle pain -vomiting This list may not describe all possible side effects. Call your doctor for medical advice about side effects. You may report side effects to FDA at 1-800-FDA-1088. Where should I keep my medicine? Keep out of the reach of children. Store in a refrigerator between   2 and 8 degrees C (36 and 46 degrees F). Keep in carton to protect from light. Throw away this medicine if it is left out of the refrigerator for more than 5 consecutive days. Throw away any unused medicine after the expiration  date. NOTE: This sheet is a summary. It may not cover all possible information. If you have questions about this medicine, talk to your doctor, pharmacist, or health care provider.  2018 Elsevier/Gold Standard (2015-07-31 19:07:04)  

## 2017-12-19 ENCOUNTER — Inpatient Hospital Stay: Payer: 59

## 2017-12-19 DIAGNOSIS — Z5111 Encounter for antineoplastic chemotherapy: Secondary | ICD-10-CM | POA: Diagnosis not present

## 2017-12-19 DIAGNOSIS — D701 Agranulocytosis secondary to cancer chemotherapy: Secondary | ICD-10-CM

## 2017-12-19 DIAGNOSIS — T451X5A Adverse effect of antineoplastic and immunosuppressive drugs, initial encounter: Principal | ICD-10-CM

## 2017-12-19 MED ORDER — TBO-FILGRASTIM 300 MCG/0.5ML ~~LOC~~ SOSY
300.0000 ug | PREFILLED_SYRINGE | Freq: Once | SUBCUTANEOUS | Status: AC
  Administered 2017-12-19: 300 ug via SUBCUTANEOUS

## 2017-12-19 MED ORDER — TBO-FILGRASTIM 300 MCG/0.5ML ~~LOC~~ SOSY
PREFILLED_SYRINGE | SUBCUTANEOUS | Status: AC
  Filled 2017-12-19: qty 0.5

## 2017-12-19 NOTE — Assessment & Plan Note (Addendum)
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  She is currently undergoing treatment with cisplatin 80 mg/M2 on day 1 and etoposide 100 mg/M2 on days 1, 2 and 3 every 3 weeks.  Status post 2 cycles which she is tolerating fairly well with the exception of mild nausea and neutropenia.  She receives Granix as needed depending on her weekly lab results. Counts reviewed and are adequate for treatment today.  CMET is pending. The patient will continue to have weekly labs. Follow-up visit will be in 3 weeks for evaluation prior to cycle 4 of her treatment.  She was advised to call immediately if she has any concerning symptoms in the interval. The patient voices understanding of current disease status and treatment options and is in agreement with the current care plan.  All questions were answered. The patient knows to call the clinic with any problems, questions or concerns. We can certainly see the patient much sooner if necessary.

## 2017-12-19 NOTE — Progress Notes (Signed)
[facility] OFFICE PROGRESS NOTE  [name], MD [place] [address]  DIAGNOSIS: Limited stage (T2 a,N0, M0) small cell lung cancer presented with left lower lobe lung mass status post left lower lobectomy with lymph node dissection on September 30, 2017.  PRIOR THERAPY: None  CURRENT THERAPY: Adjuvant systemic chemotherapy was 4 cycles of cisplatin 80 mg/M2 on day 1 and etoposide 100 mg/M2 on days 1, 2 and3every 3 weeks.Cycle 1 started on 11/04/2017.  Status post 2 cycles.  INTERVAL HISTORY: Brandi Crosby 62 y.o. female returns for routine follow-up visit coming by her husband.  The patient is feeling fine and has no specific complaints.  She had mild nausea 3 to 4 days after her last cycle of chemotherapy.  She did not have any vomiting.  Half a tablet Compazine was effective.  Denies fevers and chills.  Denies chest pain, shortness breath, cough, hemoptysis.  Denies nausea, vomiting, constipation, diarrhea today.  She reports a good appetite.  She has lost a few pounds since her last visit.  The patient is here for evaluation prior to starting cycle #3 of her treatment.  MEDICAL HISTORY: Past Medical History:  Diagnosis Date  . Allergy   . Anxiety   . Depression   . Hyperlipemia   . Lung mass   . Shingles     ALLERGIES:  is allergic to augmentin [amoxicillin-pot clavulanate] and penicillins.  MEDICATIONS:  Current Outpatient Medications  Medication Sig Dispense Refill  . ALPRAZolam (XANAX) 0.5 MG tablet Take 0.5 mg by mouth at bedtime as needed for anxiety or sleep.     . Ascorbic Acid (VITAMIN C) 1000 MG tablet Take 1,000 mg by mouth daily.      . cholecalciferol (VITAMIN D) 1000 units tablet Take 1,000 Units by mouth daily.    [name] escitalopram (LEXAPRO) 10 MG tablet Take 10 mg by mouth daily.     [name] ibuprofen (ADVIL,MOTRIN) 200 MG tablet Take 400 mg by mouth every 6 (six) hours as needed for headache or moderate pain.     [name]  prochlorperazine (COMPAZINE) 10 MG tablet Take 1 tablet (10 mg total) by mouth every 6 (six) hours as needed for nausea or vomiting. (Patient not taking: Reported on 11/24/2017) 30 tablet 0   No current facility-administered medications for this visit.     SURGICAL HISTORY:  Past Surgical History:  Procedure Laterality Date  . DENTAL SURGERY  2010   implant  . NODE DISSECTION Left 09/30/2017   Procedure: NODE DISSECTION;  Surgeon: Grace Isaac, MD;  Location: [facility];  Service: Thoracic;  Laterality: Left;  . RIGHT OOPHORECTOMY    . TONSILLECTOMY AND ADENOIDECTOMY    . VIDEO ASSISTED THORACOSCOPY (VATS)/WEDGE RESECTION Left 09/30/2017   Procedure: VIDEO ASSISTED THORACOSCOPY (VATS)/LUNG RESECTION;  Surgeon: Grace Isaac, MD;  Location: [facility];  Service: Thoracic;  Laterality: Left;  [name] VIDEO BRONCHOSCOPY N/A 09/30/2017   Procedure: VIDEO BRONCHOSCOPY;  Surgeon: Grace Isaac, MD;  Location: [facility];  Service: Thoracic;  Laterality: N/A;  . WISDOM TOOTH EXTRACTION      REVIEW OF SYSTEMS:   Review of Systems  Review of Systems  Constitutional: Positive for weight loss. Negative for fever and malaise/fatigue.  HENT: Negative for hearing loss, sore throat and tinnitus.   Eyes: Negative.   Respiratory: Negative for cough, hemoptysis and shortness of breath.   Cardiovascular: Negative for chest pain, palpitations and leg swelling.  Gastrointestinal: Negative for constipation,  diarrhea, nausea and vomiting.       She had nausea 3 to 4 days following her last cycle of chemotherapy.  Half a tablet Compazine was effective for controlling her nausea.  Genitourinary: Negative.   Musculoskeletal: Negative for back pain.  Skin: Negative for itching and rash.  Neurological: Negative for dizziness, seizures and headaches.  Endo/Heme/Allergies: Negative.   Psychiatric/Behavioral: Negative.      PHYSICAL EXAMINATION:  Blood pressure (!) 147/62, pulse 70, temperature 98.8 F (37.1 C),  temperature source Oral, resp. rate 17, height 5\' 5"  (1.651 m), weight 120 lb 1.6 oz (54.5 kg), SpO2 99 %.  ECOG PERFORMANCE STATUS: 1 - Symptomatic but completely ambulatory  Physical Exam  Physical Exam  Constitutional: She is oriented to person, place, and time. She appears well-developed and well-nourished.  HENT:  Head: Normocephalic and atraumatic.  Mouth/Throat: Oropharynx is clear and moist.  Eyes: Pupils are equal, round, and reactive to light. Conjunctivae and EOM are normal. Right eye exhibits no discharge. Left eye exhibits no discharge. No scleral icterus.  Neck: Normal range of motion.  Cardiovascular: Normal rate, regular rhythm and normal heart sounds. Exam reveals no friction rub.  No murmur heard. Pulmonary/Chest: Effort normal and breath sounds normal. No respiratory distress. She has no wheezes. She has no rales.  Abdominal: Soft. Bowel sounds are normal. She exhibits no distension and no mass. There is no tenderness.  Musculoskeletal: Normal range of motion. She exhibits no edema.  Lymphadenopathy:    She has no cervical adenopathy.  Neurological: She is oriented to person, place, and time. She exhibits normal muscle tone. Coordination normal.  Skin: Skin is warm and dry. No rash noted. No erythema.  Psychiatric: She has a normal mood and affect. Her behavior is normal. Judgment and thought content normal.    LABORATORY DATA: Lab Results  Component Value Date   WBC 2.2 (L) 12/15/2017   HGB 9.6 (L) 12/15/2017   HCT 28.9 (L) 12/15/2017   MCV 88.1 12/15/2017   PLT 86 (L) 12/15/2017      Chemistry      Component Value Date/Time   NA 139 12/15/2017 0858   K 4.4 12/15/2017 0858   CL 103 12/15/2017 0858   CO2 27 12/15/2017 0858   BUN 13 12/15/2017 0858   CREATININE 0.83 12/15/2017 0858      Component Value Date/Time   CALCIUM 9.4 12/15/2017 0858   ALKPHOS 64 12/15/2017 0858   AST 16 12/15/2017 0858   ALT 10 12/15/2017 0858   BILITOT <0.2 (L) 12/15/2017  0858       RADIOGRAPHIC STUDIES:  No results found.   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  She is currently undergoing treatment with cisplatin 80 mg/M2 on day 1 and etoposide 100 mg/M2 on days 1, 2 and 3 every 3 weeks.  Status post 2 cycles which she is tolerating fairly well with the exception of mild nausea and neutropenia.  She receives Granix as needed depending on her weekly lab results. Counts reviewed and are adequate for treatment today.  CMET is pending. The patient will continue to have weekly labs. Follow-up visit will be in 3 weeks for evaluation prior to cycle 4 of her treatment.  She was advised to call immediately if she has any concerning symptoms in the interval. The patient voices understanding  of current disease status and treatment options and is in agreement with the current care plan.  All questions were answered. The patient knows to call the clinic with any problems, questions or concerns. We can certainly see the patient much sooner if necessary.   No orders of the defined types were placed in this encounter.   Mikey Bussing, DNP, AGPCNP-BC, AOCNP 12/19/17

## 2017-12-22 ENCOUNTER — Inpatient Hospital Stay: Payer: 59 | Attending: Internal Medicine

## 2017-12-22 ENCOUNTER — Other Ambulatory Visit: Payer: 59

## 2017-12-22 ENCOUNTER — Encounter: Payer: Self-pay | Admitting: Oncology

## 2017-12-22 ENCOUNTER — Inpatient Hospital Stay (HOSPITAL_BASED_OUTPATIENT_CLINIC_OR_DEPARTMENT_OTHER): Payer: 59 | Admitting: Oncology

## 2017-12-22 ENCOUNTER — Inpatient Hospital Stay: Payer: 59

## 2017-12-22 ENCOUNTER — Telehealth: Payer: Self-pay | Admitting: Oncology

## 2017-12-22 VITALS — BP 147/62 | HR 70 | Temp 98.8°F | Resp 17 | Ht 65.0 in | Wt 120.1 lb

## 2017-12-22 DIAGNOSIS — R5383 Other fatigue: Secondary | ICD-10-CM | POA: Diagnosis not present

## 2017-12-22 DIAGNOSIS — Z5111 Encounter for antineoplastic chemotherapy: Secondary | ICD-10-CM

## 2017-12-22 DIAGNOSIS — C3432 Malignant neoplasm of lower lobe, left bronchus or lung: Secondary | ICD-10-CM

## 2017-12-22 DIAGNOSIS — D709 Neutropenia, unspecified: Secondary | ICD-10-CM

## 2017-12-22 DIAGNOSIS — R110 Nausea: Secondary | ICD-10-CM

## 2017-12-22 DIAGNOSIS — R432 Parageusia: Secondary | ICD-10-CM | POA: Diagnosis not present

## 2017-12-22 DIAGNOSIS — R634 Abnormal weight loss: Secondary | ICD-10-CM | POA: Diagnosis not present

## 2017-12-22 LAB — CBC WITH DIFFERENTIAL (CANCER CENTER ONLY)
Basophils Absolute: 0.1 10*3/uL (ref 0.0–0.1)
Basophils Relative: 2 %
Eosinophils Absolute: 0.1 10*3/uL (ref 0.0–0.5)
Eosinophils Relative: 2 %
HCT: 33.3 % — ABNORMAL LOW (ref 34.8–46.6)
Hemoglobin: 11 g/dL — ABNORMAL LOW (ref 11.6–15.9)
Lymphocytes Relative: 34 %
Lymphs Abs: 2.3 10*3/uL (ref 0.9–3.3)
MCH: 29.9 pg (ref 25.1–34.0)
MCHC: 33 g/dL (ref 31.5–36.0)
MCV: 90.5 fL (ref 79.5–101.0)
Monocytes Absolute: 1 10*3/uL — ABNORMAL HIGH (ref 0.1–0.9)
Monocytes Relative: 15 %
Neutro Abs: 3.2 10*3/uL (ref 1.5–6.5)
Neutrophils Relative %: 47 %
Platelet Count: 236 10*3/uL (ref 145–400)
RBC: 3.68 MIL/uL — ABNORMAL LOW (ref 3.70–5.45)
RDW: 17.1 % — ABNORMAL HIGH (ref 11.2–14.5)
WBC Count: 6.7 10*3/uL (ref 3.9–10.3)

## 2017-12-22 LAB — CMP (CANCER CENTER ONLY)
ALT: 15 U/L (ref 0–44)
AST: 18 U/L (ref 15–41)
Albumin: 4.2 g/dL (ref 3.5–5.0)
Alkaline Phosphatase: 77 U/L (ref 38–126)
Anion gap: 9 (ref 5–15)
BUN: 6 mg/dL (ref 6–20)
CO2: 26 mmol/L (ref 22–32)
Calcium: 9.7 mg/dL (ref 8.9–10.3)
Chloride: 104 mmol/L (ref 98–111)
Creatinine: 0.85 mg/dL (ref 0.44–1.00)
GFR, Est AFR Am: >60 mL/min (ref >60)
GFR, Estimated: >60 mL/min (ref >60)
Glucose, Bld: 108 mg/dL — ABNORMAL HIGH (ref 70–99)
Potassium: 3.9 mmol/L (ref 3.5–5.1)
Sodium: 139 mmol/L (ref 135–145)
Total Bilirubin: <0.2 mg/dL — ABNORMAL LOW (ref 0.3–1.2)
Total Protein: 6.8 g/dL (ref 6.5–8.1)

## 2017-12-22 LAB — MAGNESIUM: Magnesium: 1.7 mg/dL (ref 1.7–2.4)

## 2017-12-22 MED ORDER — SODIUM CHLORIDE 0.9 % IV SOLN
80.0000 mg/m2 | Freq: Once | INTRAVENOUS | Status: AC
  Administered 2017-12-22: 130 mg via INTRAVENOUS
  Filled 2017-12-22: qty 130

## 2017-12-22 MED ORDER — PALONOSETRON HCL INJECTION 0.25 MG/5ML
0.2500 mg | Freq: Once | INTRAVENOUS | Status: AC
  Administered 2017-12-22: 0.25 mg via INTRAVENOUS

## 2017-12-22 MED ORDER — POTASSIUM CHLORIDE 2 MEQ/ML IV SOLN
Freq: Once | INTRAVENOUS | Status: AC
  Administered 2017-12-22: 10:00:00 via INTRAVENOUS
  Filled 2017-12-22: qty 10

## 2017-12-22 MED ORDER — PALONOSETRON HCL INJECTION 0.25 MG/5ML
INTRAVENOUS | Status: AC
  Filled 2017-12-22: qty 5

## 2017-12-22 MED ORDER — SODIUM CHLORIDE 0.9 % IV SOLN
Freq: Once | INTRAVENOUS | Status: AC
  Administered 2017-12-22: 12:00:00 via INTRAVENOUS
  Filled 2017-12-22: qty 5

## 2017-12-22 MED ORDER — SODIUM CHLORIDE 0.9 % IV SOLN
Freq: Once | INTRAVENOUS | Status: AC
  Administered 2017-12-22: 12:00:00 via INTRAVENOUS

## 2017-12-22 MED ORDER — SODIUM CHLORIDE 0.9 % IV SOLN
100.0000 mg/m2 | Freq: Once | INTRAVENOUS | Status: AC
  Administered 2017-12-22: 160 mg via INTRAVENOUS
  Filled 2017-12-22: qty 8

## 2017-12-22 NOTE — Telephone Encounter (Signed)
Appts already scheduled per 7/1 los and per treatment plan - no additional appt added

## 2017-12-22 NOTE — Patient Instructions (Signed)
[facility] Discharge Instructions for Patients Receiving Chemotherapy  Today you received the following chemotherapy agents Cisplatin and Etoposide  To help prevent nausea and vomiting after your treatment, we encourage you to take your nausea medication as directed If you develop nausea and vomiting that is not controlled by your nausea medication, call the clinic.   BELOW ARE SYMPTOMS THAT SHOULD BE REPORTED IMMEDIATELY:  *FEVER GREATER THAN 100.5 F  *CHILLS WITH OR WITHOUT FEVER  NAUSEA AND VOMITING THAT IS NOT CONTROLLED WITH YOUR NAUSEA MEDICATION  *UNUSUAL SHORTNESS OF BREATH  *UNUSUAL BRUISING OR BLEEDING  TENDERNESS IN MOUTH AND THROAT WITH OR WITHOUT PRESENCE OF ULCERS  *URINARY PROBLEMS  *BOWEL PROBLEMS  UNUSUAL RASH Items with * indicate a potential emergency and should be followed up as soon as possible.  Feel free to call the clinic should you have any questions or concerns. The clinic phone number is [phone].  Please show the [facility] at check-in to the Emergency Department and triage nurse.

## 2017-12-23 ENCOUNTER — Inpatient Hospital Stay: Payer: 59

## 2017-12-23 VITALS — BP 143/72 | HR 63 | Temp 99.1°F | Resp 16

## 2017-12-23 DIAGNOSIS — C3432 Malignant neoplasm of lower lobe, left bronchus or lung: Secondary | ICD-10-CM

## 2017-12-23 DIAGNOSIS — Z5111 Encounter for antineoplastic chemotherapy: Secondary | ICD-10-CM | POA: Diagnosis not present

## 2017-12-23 MED ORDER — DEXAMETHASONE SODIUM PHOSPHATE 10 MG/ML IJ SOLN
10.0000 mg | Freq: Once | INTRAMUSCULAR | Status: AC
  Administered 2017-12-23: 10 mg via INTRAVENOUS

## 2017-12-23 MED ORDER — SODIUM CHLORIDE 0.9 % IV SOLN
Freq: Once | INTRAVENOUS | Status: AC
  Administered 2017-12-23: 08:00:00 via INTRAVENOUS

## 2017-12-23 MED ORDER — DEXAMETHASONE SODIUM PHOSPHATE 10 MG/ML IJ SOLN
INTRAMUSCULAR | Status: AC
  Filled 2017-12-23: qty 1

## 2017-12-23 MED ORDER — ETOPOSIDE CHEMO INJECTION 1 GM/50ML
100.0000 mg/m2 | Freq: Once | INTRAVENOUS | Status: AC
  Administered 2017-12-23: 160 mg via INTRAVENOUS
  Filled 2017-12-23: qty 8

## 2017-12-23 NOTE — Patient Instructions (Signed)
[facility] Discharge Instructions for Patients Receiving Chemotherapy  Today you received the following chemotherapy agents:  Etoposide.  To help prevent nausea and vomiting after your treatment, we encourage you to take your nausea medication as directed.   If you develop nausea and vomiting that is not controlled by your nausea medication, call the clinic.   BELOW ARE SYMPTOMS THAT SHOULD BE REPORTED IMMEDIATELY:  *FEVER GREATER THAN 100.5 F  *CHILLS WITH OR WITHOUT FEVER  NAUSEA AND VOMITING THAT IS NOT CONTROLLED WITH YOUR NAUSEA MEDICATION  *UNUSUAL SHORTNESS OF BREATH  *UNUSUAL BRUISING OR BLEEDING  TENDERNESS IN MOUTH AND THROAT WITH OR WITHOUT PRESENCE OF ULCERS  *URINARY PROBLEMS  *BOWEL PROBLEMS  UNUSUAL RASH Items with * indicate a potential emergency and should be followed up as soon as possible.  Feel free to call the clinic should you have any questions or concerns. The clinic phone number is [phone].  Please show the [facility] at check-in to the Emergency Department and triage nurse.

## 2017-12-24 ENCOUNTER — Inpatient Hospital Stay: Payer: 59

## 2017-12-24 VITALS — BP 154/79 | HR 59 | Temp 98.4°F | Resp 18

## 2017-12-24 DIAGNOSIS — C3432 Malignant neoplasm of lower lobe, left bronchus or lung: Secondary | ICD-10-CM

## 2017-12-24 DIAGNOSIS — Z5111 Encounter for antineoplastic chemotherapy: Secondary | ICD-10-CM | POA: Diagnosis not present

## 2017-12-24 MED ORDER — DEXAMETHASONE SODIUM PHOSPHATE 10 MG/ML IJ SOLN
10.0000 mg | Freq: Once | INTRAMUSCULAR | Status: AC
  Administered 2017-12-24: 10 mg via INTRAVENOUS

## 2017-12-24 MED ORDER — SODIUM CHLORIDE 0.9 % IV SOLN
100.0000 mg/m2 | Freq: Once | INTRAVENOUS | Status: AC
  Administered 2017-12-24: 160 mg via INTRAVENOUS
  Filled 2017-12-24: qty 8

## 2017-12-24 MED ORDER — DEXAMETHASONE SODIUM PHOSPHATE 10 MG/ML IJ SOLN
INTRAMUSCULAR | Status: AC
Start: 2017-12-24 — End: ?
  Filled 2017-12-24: qty 1

## 2017-12-24 MED ORDER — SODIUM CHLORIDE 0.9 % IV SOLN
Freq: Once | INTRAVENOUS | Status: AC
  Administered 2017-12-24: 08:00:00 via INTRAVENOUS

## 2017-12-24 NOTE — Patient Instructions (Signed)
[facility] Discharge Instructions for Patients Receiving Chemotherapy  Today you received the following chemotherapy agents Etoposide  To help prevent nausea and vomiting after your treatment, we encourage you to take your nausea medication as directed If you develop nausea and vomiting that is not controlled by your nausea medication, call the clinic.   BELOW ARE SYMPTOMS THAT SHOULD BE REPORTED IMMEDIATELY:  *FEVER GREATER THAN 100.5 F  *CHILLS WITH OR WITHOUT FEVER  NAUSEA AND VOMITING THAT IS NOT CONTROLLED WITH YOUR NAUSEA MEDICATION  *UNUSUAL SHORTNESS OF BREATH  *UNUSUAL BRUISING OR BLEEDING  TENDERNESS IN MOUTH AND THROAT WITH OR WITHOUT PRESENCE OF ULCERS  *URINARY PROBLEMS  *BOWEL PROBLEMS  UNUSUAL RASH Items with * indicate a potential emergency and should be followed up as soon as possible.  Feel free to call the clinic should you have any questions or concerns. The clinic phone number is [phone].  Please show the [facility] at check-in to the Emergency Department and triage nurse.

## 2017-12-29 ENCOUNTER — Inpatient Hospital Stay: Payer: 59

## 2017-12-29 ENCOUNTER — Other Ambulatory Visit: Payer: 59

## 2017-12-29 DIAGNOSIS — Z5111 Encounter for antineoplastic chemotherapy: Secondary | ICD-10-CM | POA: Diagnosis not present

## 2017-12-29 DIAGNOSIS — C3432 Malignant neoplasm of lower lobe, left bronchus or lung: Secondary | ICD-10-CM

## 2017-12-29 LAB — CBC WITH DIFFERENTIAL (CANCER CENTER ONLY)
Basophils Absolute: 0 10*3/uL (ref 0.0–0.1)
Basophils Relative: 2 %
Eosinophils Absolute: 0 10*3/uL (ref 0.0–0.5)
Eosinophils Relative: 1 %
HCT: 32.5 % — ABNORMAL LOW (ref 34.8–46.6)
Hemoglobin: 11.1 g/dL — ABNORMAL LOW (ref 11.6–15.9)
Lymphocytes Relative: 50 %
Lymphs Abs: 1.1 10*3/uL (ref 0.9–3.3)
MCH: 29.8 pg (ref 25.1–34.0)
MCHC: 34.1 g/dL (ref 31.5–36.0)
MCV: 87.3 fL (ref 79.5–101.0)
Monocytes Absolute: 0.1 10*3/uL (ref 0.1–0.9)
Monocytes Relative: 3 %
Neutro Abs: 1 10*3/uL — ABNORMAL LOW (ref 1.5–6.5)
Neutrophils Relative %: 44 %
Platelet Count: 174 10*3/uL (ref 145–400)
RBC: 3.72 MIL/uL (ref 3.70–5.45)
RDW: 18.4 % — ABNORMAL HIGH (ref 11.2–14.5)
WBC Count: 2.3 10*3/uL — ABNORMAL LOW (ref 3.9–10.3)

## 2017-12-29 LAB — CMP (CANCER CENTER ONLY)
ALT: 13 U/L (ref 0–44)
AST: 15 U/L (ref 15–41)
Albumin: 4.4 g/dL (ref 3.5–5.0)
Alkaline Phosphatase: 63 U/L (ref 38–126)
Anion gap: 9 (ref 5–15)
BUN: 18 mg/dL (ref 6–20)
CO2: 28 mmol/L (ref 22–32)
Calcium: 9.9 mg/dL (ref 8.9–10.3)
Chloride: 97 mmol/L — ABNORMAL LOW (ref 98–111)
Creatinine: 0.94 mg/dL (ref 0.44–1.00)
GFR, Est AFR Am: >60 mL/min (ref >60)
GFR, Estimated: >60 mL/min (ref >60)
Glucose, Bld: 91 mg/dL (ref 70–99)
Potassium: 4.2 mmol/L (ref 3.5–5.1)
Sodium: 134 mmol/L — ABNORMAL LOW (ref 135–145)
Total Bilirubin: 0.4 mg/dL (ref 0.3–1.2)
Total Protein: 7.2 g/dL (ref 6.5–8.1)

## 2017-12-29 LAB — MAGNESIUM: Magnesium: 1.7 mg/dL (ref 1.7–2.4)

## 2018-01-05 ENCOUNTER — Other Ambulatory Visit: Payer: 59

## 2018-01-05 ENCOUNTER — Ambulatory Visit: Payer: 59

## 2018-01-05 ENCOUNTER — Telehealth: Payer: Self-pay | Admitting: Medical Oncology

## 2018-01-05 ENCOUNTER — Other Ambulatory Visit: Payer: Self-pay | Admitting: Medical Oncology

## 2018-01-05 ENCOUNTER — Ambulatory Visit: Payer: 59 | Admitting: Internal Medicine

## 2018-01-05 ENCOUNTER — Inpatient Hospital Stay: Payer: 59

## 2018-01-05 DIAGNOSIS — Z5111 Encounter for antineoplastic chemotherapy: Secondary | ICD-10-CM | POA: Diagnosis not present

## 2018-01-05 DIAGNOSIS — C3432 Malignant neoplasm of lower lobe, left bronchus or lung: Secondary | ICD-10-CM

## 2018-01-05 LAB — CMP (CANCER CENTER ONLY)
ALT: 12 U/L (ref 0–44)
AST: 16 U/L (ref 15–41)
Albumin: 4.1 g/dL (ref 3.5–5.0)
Alkaline Phosphatase: 70 U/L (ref 38–126)
Anion gap: 9 (ref 5–15)
BUN: 8 mg/dL (ref 6–20)
CO2: 27 mmol/L (ref 22–32)
Calcium: 9.7 mg/dL (ref 8.9–10.3)
Chloride: 104 mmol/L (ref 98–111)
Creatinine: 0.83 mg/dL (ref 0.44–1.00)
GFR, Est AFR Am: >60 mL/min (ref >60)
GFR, Estimated: >60 mL/min (ref >60)
Glucose, Bld: 92 mg/dL (ref 70–99)
Potassium: 4.6 mmol/L (ref 3.5–5.1)
Sodium: 140 mmol/L (ref 135–145)
Total Bilirubin: <0.2 mg/dL — ABNORMAL LOW (ref 0.3–1.2)
Total Protein: 7 g/dL (ref 6.5–8.1)

## 2018-01-05 LAB — CBC WITH DIFFERENTIAL (CANCER CENTER ONLY)
Basophils Absolute: 0 10*3/uL (ref 0.0–0.1)
Basophils Relative: 1 %
Eosinophils Absolute: 0 10*3/uL (ref 0.0–0.5)
Eosinophils Relative: 1 %
HCT: 28.5 % — ABNORMAL LOW (ref 34.8–46.6)
Hemoglobin: 9.8 g/dL — ABNORMAL LOW (ref 11.6–15.9)
Lymphocytes Relative: 70 %
Lymphs Abs: 1.3 10*3/uL (ref 0.9–3.3)
MCH: 29.4 pg (ref 25.1–34.0)
MCHC: 34.3 g/dL (ref 31.5–36.0)
MCV: 85.8 fL (ref 79.5–101.0)
Monocytes Absolute: 0.3 10*3/uL (ref 0.1–0.9)
Monocytes Relative: 15 %
Neutro Abs: 0.2 10*3/uL — CL (ref 1.5–6.5)
Neutrophils Relative %: 13 %
Platelet Count: 119 10*3/uL — ABNORMAL LOW (ref 145–400)
RBC: 3.32 MIL/uL — ABNORMAL LOW (ref 3.70–5.45)
RDW: 19 % — ABNORMAL HIGH (ref 11.2–14.5)
WBC Count: 1.8 10*3/uL — ABNORMAL LOW (ref 3.9–10.3)

## 2018-01-05 LAB — MAGNESIUM: Magnesium: 1.7 mg/dL (ref 1.7–2.4)

## 2018-01-05 NOTE — Telephone Encounter (Signed)
Neutropenic precautions reviewed with pt.

## 2018-01-06 ENCOUNTER — Ambulatory Visit: Payer: 59

## 2018-01-07 ENCOUNTER — Ambulatory Visit: Payer: 59

## 2018-01-12 ENCOUNTER — Encounter: Payer: Self-pay | Admitting: Internal Medicine

## 2018-01-12 ENCOUNTER — Inpatient Hospital Stay: Payer: 59 | Admitting: Internal Medicine

## 2018-01-12 ENCOUNTER — Telehealth: Payer: Self-pay

## 2018-01-12 ENCOUNTER — Inpatient Hospital Stay: Payer: 59

## 2018-01-12 VITALS — BP 150/81 | HR 67 | Temp 98.7°F | Resp 18 | Ht 65.0 in | Wt 119.2 lb

## 2018-01-12 DIAGNOSIS — R634 Abnormal weight loss: Secondary | ICD-10-CM | POA: Diagnosis not present

## 2018-01-12 DIAGNOSIS — R5383 Other fatigue: Secondary | ICD-10-CM | POA: Diagnosis not present

## 2018-01-12 DIAGNOSIS — C3432 Malignant neoplasm of lower lobe, left bronchus or lung: Secondary | ICD-10-CM

## 2018-01-12 DIAGNOSIS — R432 Parageusia: Secondary | ICD-10-CM | POA: Diagnosis not present

## 2018-01-12 DIAGNOSIS — Z716 Tobacco abuse counseling: Secondary | ICD-10-CM

## 2018-01-12 DIAGNOSIS — Z5111 Encounter for antineoplastic chemotherapy: Secondary | ICD-10-CM | POA: Diagnosis not present

## 2018-01-12 LAB — CMP (CANCER CENTER ONLY)
ALT: 10 U/L (ref 0–44)
AST: 14 U/L — ABNORMAL LOW (ref 15–41)
Albumin: 3.9 g/dL (ref 3.5–5.0)
Alkaline Phosphatase: 69 U/L (ref 38–126)
Anion gap: 11 (ref 5–15)
BUN: 11 mg/dL (ref 6–20)
CO2: 24 mmol/L (ref 22–32)
Calcium: 9.3 mg/dL (ref 8.9–10.3)
Chloride: 103 mmol/L (ref 98–111)
Creatinine: 0.85 mg/dL (ref 0.44–1.00)
GFR, Est AFR Am: >60 mL/min (ref >60)
GFR, Estimated: >60 mL/min (ref >60)
Glucose, Bld: 90 mg/dL (ref 70–99)
Potassium: 3.9 mmol/L (ref 3.5–5.1)
Sodium: 138 mmol/L (ref 135–145)
Total Bilirubin: <0.2 mg/dL — ABNORMAL LOW (ref 0.3–1.2)
Total Protein: 6.6 g/dL (ref 6.5–8.1)

## 2018-01-12 LAB — CBC WITH DIFFERENTIAL (CANCER CENTER ONLY)
Basophils Absolute: 0 10*3/uL (ref 0.0–0.1)
Basophils Relative: 1 %
Eosinophils Absolute: 0.1 10*3/uL (ref 0.0–0.5)
Eosinophils Relative: 2 %
HCT: 31 % — ABNORMAL LOW (ref 34.8–46.6)
Hemoglobin: 10.3 g/dL — ABNORMAL LOW (ref 11.6–15.9)
Lymphocytes Relative: 39 %
Lymphs Abs: 1.4 10*3/uL (ref 0.9–3.3)
MCH: 30 pg (ref 25.1–34.0)
MCHC: 33.4 g/dL (ref 31.5–36.0)
MCV: 89.7 fL (ref 79.5–101.0)
Monocytes Absolute: 0.7 10*3/uL (ref 0.1–0.9)
Monocytes Relative: 19 %
Neutro Abs: 1.4 10*3/uL — ABNORMAL LOW (ref 1.5–6.5)
Neutrophils Relative %: 39 %
Platelet Count: 269 10*3/uL (ref 145–400)
RBC: 3.45 MIL/uL — ABNORMAL LOW (ref 3.70–5.45)
RDW: 20.5 % — ABNORMAL HIGH (ref 11.2–14.5)
WBC Count: 3.6 10*3/uL — ABNORMAL LOW (ref 3.9–10.3)

## 2018-01-12 LAB — MAGNESIUM: Magnesium: 1.7 mg/dL (ref 1.7–2.4)

## 2018-01-12 MED ORDER — SODIUM CHLORIDE 0.9 % IV SOLN
100.0000 mg/m2 | Freq: Once | INTRAVENOUS | Status: AC
  Administered 2018-01-12: 160 mg via INTRAVENOUS
  Filled 2018-01-12: qty 8

## 2018-01-12 MED ORDER — HEPARIN SOD (PORK) LOCK FLUSH 100 UNIT/ML IV SOLN
500.0000 [IU] | Freq: Once | INTRAVENOUS | Status: AC | PRN
  Administered 2018-01-12: 500 [IU]
  Filled 2018-01-12: qty 5

## 2018-01-12 MED ORDER — POTASSIUM CHLORIDE 2 MEQ/ML IV SOLN
Freq: Once | INTRAVENOUS | Status: AC
  Administered 2018-01-12: 10:00:00 via INTRAVENOUS
  Filled 2018-01-12: qty 10

## 2018-01-12 MED ORDER — PALONOSETRON HCL INJECTION 0.25 MG/5ML
0.2500 mg | Freq: Once | INTRAVENOUS | Status: AC
  Administered 2018-01-12: 0.25 mg via INTRAVENOUS

## 2018-01-12 MED ORDER — SODIUM CHLORIDE 0.9 % IV SOLN
Freq: Once | INTRAVENOUS | Status: AC
  Administered 2018-01-12: 12:00:00 via INTRAVENOUS
  Filled 2018-01-12: qty 5

## 2018-01-12 MED ORDER — PALONOSETRON HCL INJECTION 0.25 MG/5ML
INTRAVENOUS | Status: AC
  Filled 2018-01-12: qty 5

## 2018-01-12 MED ORDER — SODIUM CHLORIDE 0.9 % IV SOLN
Freq: Once | INTRAVENOUS | Status: AC
  Administered 2018-01-12: 09:00:00 via INTRAVENOUS

## 2018-01-12 MED ORDER — SODIUM CHLORIDE 0.9% FLUSH
10.0000 mL | INTRAVENOUS | Status: DC | PRN
  Administered 2018-01-12: 10 mL
  Filled 2018-01-12: qty 10

## 2018-01-12 MED ORDER — SODIUM CHLORIDE 0.9 % IV SOLN
80.0000 mg/m2 | Freq: Once | INTRAVENOUS | Status: AC
  Administered 2018-01-12: 130 mg via INTRAVENOUS
  Filled 2018-01-12: qty 130

## 2018-01-12 NOTE — Progress Notes (Signed)
[facility] [phone]   [phone]  OFFICE PROGRESS NOTE  [name], MD [address] [place] [address]  DIAGNOSIS: Limited stage (T2 a, N0, M0) of small cell lung cancer presented with left lower lobe lung mass status post left lower lobectomy with lymph node dissection on September 30, 2017.  PRIOR THERAPY: None.  CURRENT THERAPY: Adjuvant systemic chemotherapy with 4 cycles of cisplatin 80 mg/M2 on day 1 and etoposide 100 mg/M2 on days 1, 2 and 3 every 3 weeks.  First dose expected Nov 03, 2017.  Status post 3 cycles.  INTERVAL HISTORY: Brandi Crosby 62 y.o. female returns to the clinic today for follow-up visit accompanied by her husband.  The patient is feeling fine today with no concerning complaints except for the metallic taste.  She denied having any chest pain, shortness breath, cough or hemoptysis.  She denied having any fever or chills.  She has no nausea, vomiting, diarrhea or constipation.  She lost around 8 pounds since the start of her adjuvant treatment.  She is here today for evaluation before starting cycle #4 of her systemic chemotherapy.  MEDICAL HISTORY: Past Medical History:  Diagnosis Date  . Allergy   . Anxiety   . Depression   . Hyperlipemia   . Lung mass   . Shingles     ALLERGIES:  is allergic to augmentin [amoxicillin-pot clavulanate] and penicillins.  MEDICATIONS:  Current Outpatient Medications  Medication Sig Dispense Refill  . ALPRAZolam (XANAX) 0.5 MG tablet Take 0.5 mg by mouth at bedtime as needed for anxiety or sleep.     . Ascorbic Acid (VITAMIN C) 1000 MG tablet Take 1,000 mg by mouth daily.      . cholecalciferol (VITAMIN D) 1000 units tablet Take 1,000 Units by mouth daily.    [name] escitalopram (LEXAPRO) 10 MG tablet Take 10 mg by mouth daily.     [name] ibuprofen (ADVIL,MOTRIN) 200 MG tablet Take 400 mg by mouth every 6 (six) hours as needed for headache or moderate pain.     [name]  prochlorperazine (COMPAZINE) 10 MG tablet Take 1 tablet (10 mg total) by mouth every 6 (six) hours as needed for nausea or vomiting. 30 tablet 0   No current facility-administered medications for this visit.     SURGICAL HISTORY:  Past Surgical History:  Procedure Laterality Date  . DENTAL SURGERY  2010   implant  . NODE DISSECTION Left 09/30/2017   Procedure: NODE DISSECTION;  Surgeon: Grace Isaac, MD;  Location: [facility];  Service: Thoracic;  Laterality: Left;  . RIGHT OOPHORECTOMY    . TONSILLECTOMY AND ADENOIDECTOMY    . VIDEO ASSISTED THORACOSCOPY (VATS)/WEDGE RESECTION Left 09/30/2017   Procedure: VIDEO ASSISTED THORACOSCOPY (VATS)/LUNG RESECTION;  Surgeon: Grace Isaac, MD;  Location: [facility];  Service: Thoracic;  Laterality: Left;  [name] VIDEO BRONCHOSCOPY N/A 09/30/2017   Procedure: VIDEO BRONCHOSCOPY;  Surgeon: Grace Isaac, MD;  Location: [facility];  Service: Thoracic;  Laterality: N/A;  . WISDOM TOOTH EXTRACTION      REVIEW OF SYSTEMS:  A comprehensive review of systems was negative except for: Constitutional: positive for weight loss   PHYSICAL EXAMINATION: General appearance: alert, cooperative and no distress Head: Normocephalic, without obvious abnormality, atraumatic Neck: no adenopathy, no JVD, supple, symmetrical, trachea midline and thyroid not enlarged, symmetric, no tenderness/mass/nodules Lymph nodes: Cervical, supraclavicular, and axillary nodes normal. Resp: clear to auscultation bilaterally Back: symmetric, no curvature.  ROM normal. No CVA tenderness. Cardio: regular rate and rhythm, S1, S2 normal, no murmur, click, rub or gallop GI: soft, non-tender; bowel sounds normal; no masses,  no organomegaly Extremities: extremities normal, atraumatic, no cyanosis or edema  ECOG PERFORMANCE STATUS: 0 - Asymptomatic  Blood pressure (!) 150/81, pulse 67, temperature 98.7 F (37.1 C), temperature source Oral, resp. rate 18, height 5\' 5"  (1.651 m), weight 119 lb 3.2  oz (54.1 kg), SpO2 99 %.  LABORATORY DATA: Lab Results  Component Value Date   WBC 3.6 (L) 01/12/2018   HGB 10.3 (L) 01/12/2018   HCT 31.0 (L) 01/12/2018   MCV 89.7 01/12/2018   PLT 269 01/12/2018      Chemistry      Component Value Date/Time   NA 140 01/05/2018 0842   K 4.6 01/05/2018 0842   CL 104 01/05/2018 0842   CO2 27 01/05/2018 0842   BUN 8 01/05/2018 0842   CREATININE 0.83 01/05/2018 0842      Component Value Date/Time   CALCIUM 9.7 01/05/2018 0842   ALKPHOS 70 01/05/2018 0842   AST 16 01/05/2018 0842   ALT 12 01/05/2018 0842   BILITOT <0.2 (L) 01/05/2018 0842       RADIOGRAPHIC STUDIES: No results found.  ASSESSMENT AND PLAN: This is a very pleasant 62 years old white female recently diagnosed with limited stage small cell lung cancer status post surgical resection.   She is currently undergoing adjuvant treatment with systemic chemotherapy with cisplatin and etoposide status post 3 cycles.  She has been tolerating this treatment well with no concerning complaints except for mild fatigue, metallic taste and some weight loss. I recommended for her to proceed with cycle #4 today as a schedule. I will see her back for follow-up visit in 1 months for evaluation after repeating CT scan of the chest for restaging of her disease. The patient was advised to call immediately if she has any concerning symptoms in the interval. The patient voices understanding of current disease status and treatment options and is in agreement with the current care plan.  All questions were answered. The patient knows to call the clinic with any problems, questions or concerns. We can certainly see the patient much sooner if necessary.  I spent 10 minutes counseling the patient face to face. The total time spent in the appointment was 15 minutes.  Disclaimer: This note was dictated with voice recognition software. Similar sounding words can inadvertently be transcribed and may not be  corrected upon review.

## 2018-01-12 NOTE — Patient Instructions (Signed)
[facility] Discharge Instructions for Patients Receiving Chemotherapy  Today you received the following chemotherapy agents Cisplatin, Etoposide  To help prevent nausea and vomiting after your treatment, we encourage you to take your nausea medication as directed   If you develop nausea and vomiting that is not controlled by your nausea medication, call the clinic.   BELOW ARE SYMPTOMS THAT SHOULD BE REPORTED IMMEDIATELY:  *FEVER GREATER THAN 100.5 F  *CHILLS WITH OR WITHOUT FEVER  NAUSEA AND VOMITING THAT IS NOT CONTROLLED WITH YOUR NAUSEA MEDICATION  *UNUSUAL SHORTNESS OF BREATH  *UNUSUAL BRUISING OR BLEEDING  TENDERNESS IN MOUTH AND THROAT WITH OR WITHOUT PRESENCE OF ULCERS  *URINARY PROBLEMS  *BOWEL PROBLEMS  UNUSUAL RASH Items with * indicate a potential emergency and should be followed up as soon as possible.  Feel free to call the clinic should you have any questions or concerns. The clinic phone number is [phone].  Please show the [place] at check-in to the Emergency Department and triage nurse.

## 2018-01-12 NOTE — Telephone Encounter (Signed)
Printed avs and calender of upcoming appointment. Pert 7/22 los

## 2018-01-12 NOTE — Progress Notes (Signed)
Ok to treat with 01/12/18 lab results per Dr. Julien Nordmann.

## 2018-01-13 ENCOUNTER — Inpatient Hospital Stay: Payer: 59

## 2018-01-13 VITALS — BP 149/80 | HR 69 | Temp 98.6°F | Resp 18

## 2018-01-13 DIAGNOSIS — C3432 Malignant neoplasm of lower lobe, left bronchus or lung: Secondary | ICD-10-CM

## 2018-01-13 DIAGNOSIS — Z5111 Encounter for antineoplastic chemotherapy: Secondary | ICD-10-CM | POA: Diagnosis not present

## 2018-01-13 MED ORDER — SODIUM CHLORIDE 0.9 % IV SOLN
100.0000 mg/m2 | Freq: Once | INTRAVENOUS | Status: AC
  Administered 2018-01-13: 160 mg via INTRAVENOUS
  Filled 2018-01-13: qty 8

## 2018-01-13 MED ORDER — DEXAMETHASONE SODIUM PHOSPHATE 10 MG/ML IJ SOLN
10.0000 mg | Freq: Once | INTRAMUSCULAR | Status: AC
  Administered 2018-01-13: 10 mg via INTRAVENOUS

## 2018-01-13 MED ORDER — SODIUM CHLORIDE 0.9 % IV SOLN
Freq: Once | INTRAVENOUS | Status: AC
  Administered 2018-01-13: 08:00:00 via INTRAVENOUS

## 2018-01-13 MED ORDER — DEXAMETHASONE SODIUM PHOSPHATE 10 MG/ML IJ SOLN
INTRAMUSCULAR | Status: AC
  Filled 2018-01-13: qty 1

## 2018-01-13 NOTE — Patient Instructions (Signed)
[facility] Discharge Instructions for Patients Receiving Chemotherapy  Today you received the following chemotherapy agents Etoposide  To help prevent nausea and vomiting after your treatment, we encourage you to take your nausea medication as directed   If you develop nausea and vomiting that is not controlled by your nausea medication, call the clinic.   BELOW ARE SYMPTOMS THAT SHOULD BE REPORTED IMMEDIATELY:  *FEVER GREATER THAN 100.5 F  *CHILLS WITH OR WITHOUT FEVER  NAUSEA AND VOMITING THAT IS NOT CONTROLLED WITH YOUR NAUSEA MEDICATION  *UNUSUAL SHORTNESS OF BREATH  *UNUSUAL BRUISING OR BLEEDING  TENDERNESS IN MOUTH AND THROAT WITH OR WITHOUT PRESENCE OF ULCERS  *URINARY PROBLEMS  *BOWEL PROBLEMS  UNUSUAL RASH Items with * indicate a potential emergency and should be followed up as soon as possible.  Feel free to call the clinic should you have any questions or concerns. The clinic phone number is [phone].  Please show the [place] at check-in to the Emergency Department and triage nurse.

## 2018-01-14 ENCOUNTER — Inpatient Hospital Stay: Payer: 59

## 2018-01-14 VITALS — BP 162/78 | HR 60 | Temp 98.3°F | Resp 18

## 2018-01-14 DIAGNOSIS — Z5111 Encounter for antineoplastic chemotherapy: Secondary | ICD-10-CM | POA: Diagnosis not present

## 2018-01-14 DIAGNOSIS — C3432 Malignant neoplasm of lower lobe, left bronchus or lung: Secondary | ICD-10-CM

## 2018-01-14 MED ORDER — SODIUM CHLORIDE 0.9 % IV SOLN
100.0000 mg/m2 | Freq: Once | INTRAVENOUS | Status: AC
  Administered 2018-01-14: 160 mg via INTRAVENOUS
  Filled 2018-01-14: qty 8

## 2018-01-14 MED ORDER — SODIUM CHLORIDE 0.9 % IV SOLN
Freq: Once | INTRAVENOUS | Status: AC
  Administered 2018-01-14: 09:00:00 via INTRAVENOUS

## 2018-01-14 MED ORDER — DEXAMETHASONE SODIUM PHOSPHATE 10 MG/ML IJ SOLN
INTRAMUSCULAR | Status: AC
  Filled 2018-01-14: qty 1

## 2018-01-14 MED ORDER — DEXAMETHASONE SODIUM PHOSPHATE 10 MG/ML IJ SOLN
10.0000 mg | Freq: Once | INTRAMUSCULAR | Status: AC
  Administered 2018-01-14: 10 mg via INTRAVENOUS

## 2018-01-14 NOTE — Patient Instructions (Signed)
[facility] Discharge Instructions for Patients Receiving Chemotherapy  Today you received the following chemotherapy agents Etoposide  To help prevent nausea and vomiting after your treatment, we encourage you to take your nausea medication as directed   If you develop nausea and vomiting that is not controlled by your nausea medication, call the clinic.   BELOW ARE SYMPTOMS THAT SHOULD BE REPORTED IMMEDIATELY:  *FEVER GREATER THAN 100.5 F  *CHILLS WITH OR WITHOUT FEVER  NAUSEA AND VOMITING THAT IS NOT CONTROLLED WITH YOUR NAUSEA MEDICATION  *UNUSUAL SHORTNESS OF BREATH  *UNUSUAL BRUISING OR BLEEDING  TENDERNESS IN MOUTH AND THROAT WITH OR WITHOUT PRESENCE OF ULCERS  *URINARY PROBLEMS  *BOWEL PROBLEMS  UNUSUAL RASH Items with * indicate a potential emergency and should be followed up as soon as possible.  Feel free to call the clinic should you have any questions or concerns. The clinic phone number is [phone].  Please show the [place] at check-in to the Emergency Department and triage nurse.

## 2018-01-19 ENCOUNTER — Inpatient Hospital Stay: Payer: 59

## 2018-01-19 DIAGNOSIS — Z5111 Encounter for antineoplastic chemotherapy: Secondary | ICD-10-CM | POA: Diagnosis not present

## 2018-01-19 DIAGNOSIS — C3432 Malignant neoplasm of lower lobe, left bronchus or lung: Secondary | ICD-10-CM

## 2018-01-19 LAB — CMP (CANCER CENTER ONLY)
ALT: 14 U/L (ref 0–44)
AST: 17 U/L (ref 15–41)
Albumin: 4.5 g/dL (ref 3.5–5.0)
Alkaline Phosphatase: 63 U/L (ref 38–126)
Anion gap: 10 (ref 5–15)
BUN: 20 mg/dL (ref 6–20)
CO2: 27 mmol/L (ref 22–32)
Calcium: 9.8 mg/dL (ref 8.9–10.3)
Chloride: 96 mmol/L — ABNORMAL LOW (ref 98–111)
Creatinine: 1.12 mg/dL — ABNORMAL HIGH (ref 0.44–1.00)
GFR, Est AFR Am: >60 mL/min (ref >60)
GFR, Estimated: 52 mL/min — ABNORMAL LOW (ref >60)
Glucose, Bld: 107 mg/dL — ABNORMAL HIGH (ref 70–99)
Potassium: 4.1 mmol/L (ref 3.5–5.1)
Sodium: 133 mmol/L — ABNORMAL LOW (ref 135–145)
Total Bilirubin: 0.6 mg/dL (ref 0.3–1.2)
Total Protein: 7.2 g/dL (ref 6.5–8.1)

## 2018-01-19 LAB — MAGNESIUM: Magnesium: 1.5 mg/dL — ABNORMAL LOW (ref 1.7–2.4)

## 2018-01-23 ENCOUNTER — Other Ambulatory Visit: Payer: Self-pay | Admitting: Medical Oncology

## 2018-01-23 DIAGNOSIS — C3432 Malignant neoplasm of lower lobe, left bronchus or lung: Secondary | ICD-10-CM

## 2018-01-26 ENCOUNTER — Inpatient Hospital Stay: Payer: 59 | Attending: Internal Medicine

## 2018-01-26 DIAGNOSIS — C3432 Malignant neoplasm of lower lobe, left bronchus or lung: Secondary | ICD-10-CM

## 2018-01-26 DIAGNOSIS — R5383 Other fatigue: Secondary | ICD-10-CM | POA: Insufficient documentation

## 2018-01-26 LAB — CBC WITH DIFFERENTIAL (CANCER CENTER ONLY)
Basophils Absolute: 0 10*3/uL (ref 0.0–0.1)
Basophils Relative: 0 %
Eosinophils Absolute: 0 10*3/uL (ref 0.0–0.5)
Eosinophils Relative: 1 %
HCT: 26.1 % — ABNORMAL LOW (ref 34.8–46.6)
Hemoglobin: 9 g/dL — ABNORMAL LOW (ref 11.6–15.9)
Lymphocytes Relative: 81 %
Lymphs Abs: 1.5 10*3/uL (ref 0.9–3.3)
MCH: 29.8 pg (ref 25.1–34.0)
MCHC: 34.5 g/dL (ref 31.5–36.0)
MCV: 86.4 fL (ref 79.5–101.0)
Monocytes Absolute: 0.1 10*3/uL (ref 0.1–0.9)
Monocytes Relative: 6 %
Neutro Abs: 0.2 10*3/uL — CL (ref 1.5–6.5)
Neutrophils Relative %: 12 %
Platelet Count: 45 10*3/uL — ABNORMAL LOW (ref 145–400)
RBC: 3.02 MIL/uL — ABNORMAL LOW (ref 3.70–5.45)
RDW: 17.7 % — ABNORMAL HIGH (ref 11.2–14.5)
WBC Count: 1.9 10*3/uL — ABNORMAL LOW (ref 3.9–10.3)

## 2018-01-26 LAB — CMP (CANCER CENTER ONLY)
ALT: 11 U/L (ref 0–44)
AST: 14 U/L — ABNORMAL LOW (ref 15–41)
Albumin: 4 g/dL (ref 3.5–5.0)
Alkaline Phosphatase: 66 U/L (ref 38–126)
Anion gap: 11 (ref 5–15)
BUN: 10 mg/dL (ref 6–20)
CO2: 25 mmol/L (ref 22–32)
Calcium: 9.2 mg/dL (ref 8.9–10.3)
Chloride: 101 mmol/L (ref 98–111)
Creatinine: 0.99 mg/dL (ref 0.44–1.00)
GFR, Est AFR Am: >60 mL/min (ref >60)
GFR, Estimated: >60 mL/min (ref >60)
Glucose, Bld: 95 mg/dL (ref 70–99)
Potassium: 4 mmol/L (ref 3.5–5.1)
Sodium: 137 mmol/L (ref 135–145)
Total Bilirubin: 0.3 mg/dL (ref 0.3–1.2)
Total Protein: 6.6 g/dL (ref 6.5–8.1)

## 2018-01-29 ENCOUNTER — Other Ambulatory Visit: Payer: Self-pay | Admitting: Medical Oncology

## 2018-01-29 DIAGNOSIS — C3432 Malignant neoplasm of lower lobe, left bronchus or lung: Secondary | ICD-10-CM

## 2018-02-02 ENCOUNTER — Other Ambulatory Visit: Payer: Self-pay | Admitting: Internal Medicine

## 2018-02-02 ENCOUNTER — Inpatient Hospital Stay: Payer: 59

## 2018-02-02 DIAGNOSIS — C3432 Malignant neoplasm of lower lobe, left bronchus or lung: Secondary | ICD-10-CM | POA: Diagnosis not present

## 2018-02-02 LAB — CMP (CANCER CENTER ONLY)
ALT: 10 U/L (ref 0–44)
AST: 14 U/L — ABNORMAL LOW (ref 15–41)
Albumin: 4 g/dL (ref 3.5–5.0)
Alkaline Phosphatase: 62 U/L (ref 38–126)
Anion gap: 11 (ref 5–15)
BUN: 8 mg/dL (ref 6–20)
CO2: 26 mmol/L (ref 22–32)
Calcium: 9.4 mg/dL (ref 8.9–10.3)
Chloride: 100 mmol/L (ref 98–111)
Creatinine: 0.98 mg/dL (ref 0.44–1.00)
GFR, Est AFR Am: >60 mL/min (ref >60)
GFR, Estimated: >60 mL/min (ref >60)
Glucose, Bld: 90 mg/dL (ref 70–99)
Potassium: 4.6 mmol/L (ref 3.5–5.1)
Sodium: 137 mmol/L (ref 135–145)
Total Bilirubin: 0.3 mg/dL (ref 0.3–1.2)
Total Protein: 6.8 g/dL (ref 6.5–8.1)

## 2018-02-02 LAB — CBC WITH DIFFERENTIAL (CANCER CENTER ONLY)
Basophils Absolute: 0 10*3/uL (ref 0.0–0.1)
Basophils Relative: 0 %
Eosinophils Absolute: 0 10*3/uL (ref 0.0–0.5)
Eosinophils Relative: 1 %
HCT: 26.5 % — ABNORMAL LOW (ref 34.8–46.6)
Hemoglobin: 8.9 g/dL — ABNORMAL LOW (ref 11.6–15.9)
Lymphocytes Relative: 51 %
Lymphs Abs: 1.5 10*3/uL (ref 0.9–3.3)
MCH: 30.3 pg (ref 25.1–34.0)
MCHC: 33.6 g/dL (ref 31.5–36.0)
MCV: 90.1 fL (ref 79.5–101.0)
Monocytes Absolute: 0.8 10*3/uL (ref 0.1–0.9)
Monocytes Relative: 27 %
Neutro Abs: 0.6 10*3/uL — ABNORMAL LOW (ref 1.5–6.5)
Neutrophils Relative %: 21 %
Platelet Count: 184 10*3/uL (ref 145–400)
RBC: 2.94 MIL/uL — ABNORMAL LOW (ref 3.70–5.45)
RDW: 19 % — ABNORMAL HIGH (ref 11.2–14.5)
WBC Count: 2.9 10*3/uL — ABNORMAL LOW (ref 3.9–10.3)

## 2018-02-02 LAB — MAGNESIUM: Magnesium: 1.5 mg/dL — ABNORMAL LOW (ref 1.7–2.4)

## 2018-02-05 ENCOUNTER — Other Ambulatory Visit: Payer: Self-pay | Admitting: Medical Oncology

## 2018-02-05 DIAGNOSIS — C3432 Malignant neoplasm of lower lobe, left bronchus or lung: Secondary | ICD-10-CM

## 2018-02-09 ENCOUNTER — Inpatient Hospital Stay: Payer: 59

## 2018-02-09 ENCOUNTER — Encounter (HOSPITAL_COMMUNITY): Payer: Self-pay

## 2018-02-09 ENCOUNTER — Ambulatory Visit (HOSPITAL_COMMUNITY)
Admission: RE | Admit: 2018-02-09 | Discharge: 2018-02-09 | Disposition: A | Discharge status: Home / Self Care | Payer: 59 | Source: Ambulatory Visit | Attending: Internal Medicine | Admitting: Internal Medicine

## 2018-02-09 DIAGNOSIS — C3432 Malignant neoplasm of lower lobe, left bronchus or lung: Secondary | ICD-10-CM

## 2018-02-09 DIAGNOSIS — Z902 Acquired absence of lung [part of]: Secondary | ICD-10-CM | POA: Diagnosis not present

## 2018-02-09 DIAGNOSIS — I700 Atherosclerosis of aorta: Secondary | ICD-10-CM | POA: Insufficient documentation

## 2018-02-09 LAB — CMP (CANCER CENTER ONLY)
ALT: 10 U/L (ref 0–44)
AST: 13 U/L — ABNORMAL LOW (ref 15–41)
Albumin: 3.9 g/dL (ref 3.5–5.0)
Alkaline Phosphatase: 61 U/L (ref 38–126)
Anion gap: 10 (ref 5–15)
BUN: 12 mg/dL (ref 6–20)
CO2: 26 mmol/L (ref 22–32)
Calcium: 9 mg/dL (ref 8.9–10.3)
Chloride: 101 mmol/L (ref 98–111)
Creatinine: 0.93 mg/dL (ref 0.44–1.00)
GFR, Est AFR Am: >60 mL/min (ref >60)
GFR, Estimated: >60 mL/min (ref >60)
Glucose, Bld: 104 mg/dL — ABNORMAL HIGH (ref 70–99)
Potassium: 4.4 mmol/L (ref 3.5–5.1)
Sodium: 137 mmol/L (ref 135–145)
Total Bilirubin: 0.3 mg/dL (ref 0.3–1.2)
Total Protein: 6.6 g/dL (ref 6.5–8.1)

## 2018-02-09 LAB — CBC WITH DIFFERENTIAL (CANCER CENTER ONLY)
Basophils Absolute: 0 10*3/uL (ref 0.0–0.1)
Basophils Relative: 1 %
Eosinophils Absolute: 0 10*3/uL (ref 0.0–0.5)
Eosinophils Relative: 1 %
HCT: 26.2 % — ABNORMAL LOW (ref 34.8–46.6)
Hemoglobin: 9.1 g/dL — ABNORMAL LOW (ref 11.6–15.9)
Lymphocytes Relative: 28 %
Lymphs Abs: 1.1 10*3/uL (ref 0.9–3.3)
MCH: 31.7 pg (ref 25.1–34.0)
MCHC: 34.7 g/dL (ref 31.5–36.0)
MCV: 91.4 fL (ref 79.5–101.0)
Monocytes Absolute: 0.5 10*3/uL (ref 0.1–0.9)
Monocytes Relative: 12 %
Neutro Abs: 2.3 10*3/uL (ref 1.5–6.5)
Neutrophils Relative %: 58 %
Platelet Count: 292 10*3/uL (ref 145–400)
RBC: 2.87 MIL/uL — ABNORMAL LOW (ref 3.70–5.45)
RDW: 20.8 % — ABNORMAL HIGH (ref 11.2–14.5)
WBC Count: 3.9 10*3/uL (ref 3.9–10.3)

## 2018-02-09 MED ORDER — IOHEXOL 300 MG/ML  SOLN
75.0000 mL | Freq: Once | INTRAMUSCULAR | Status: AC | PRN
  Administered 2018-02-09: 75 mL via INTRAVENOUS

## 2018-02-12 ENCOUNTER — Ambulatory Visit: Payer: 59 | Admitting: Cardiothoracic Surgery

## 2018-02-12 VITALS — BP 95/56 | HR 75 | Resp 20 | Ht 65.0 in | Wt 115.0 lb

## 2018-02-12 DIAGNOSIS — J90 Pleural effusion, not elsewhere classified: Secondary | ICD-10-CM

## 2018-02-12 DIAGNOSIS — Z902 Acquired absence of lung [part of]: Secondary | ICD-10-CM

## 2018-02-12 DIAGNOSIS — C3432 Malignant neoplasm of lower lobe, left bronchus or lung: Secondary | ICD-10-CM

## 2018-02-12 NOTE — Progress Notes (Signed)
[address]       [place] [address]             [phone]                  Mei D Mccleary Turney Medical Record #[id number] Date of Birth: [birth year]  Referring NG:[email or web address], Annie Main, MD Primary Cardiology: Primary Care:Meyers, Annie Main, MD  Chief Complaint:  Follow Up Visit 09/30/2017 OPERATIVE REPORT PREOPERATIVE DIAGNOSIS:  Left lower lobe lung mass. POSTOPERATIVE DIAGNOSIS:  Left lower lobe lung malignancy, small cell carcinoma. PROCEDURE PERFORMED:  Video bronchoscopy with brushings, left video- assisted thoracoscopy, mini thoracotomy, left lower lobectomy with lymph node dissection and placement of On-Q device. 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  She is tolerated, well with some episodes of low platelet count .  Denies shortness of breath cough wheezing or hemoptysis.     Zubrod Score: At the time of surgery this patient's most appropriate activity status/level should be described as: [x]     0    Normal activity, no symptoms []     1    Restricted in physical strenuous activity but ambulatory, able to do out light work []     2    Ambulatory and capable of self care, unable to do work activities, up and about                 >50 % of waking hours                                                                                   []     3    Only limited self care, in bed greater than 50% of waking hours []     4    Completely disabled, no self care, confined to bed or chair []     5    Moribund  Social History   Tobacco  Use  Smoking Status Former Smoker  . Packs/day: 0.25  . Years: 15.00  . Pack years: 3.75  . Types: Cigarettes  . Last attempt to quit: 09/28/2017  . Years since quitting: 0.3  Smokeless Tobacco Never Used       Allergies  Allergen Reactions  . Augmentin [Amoxicillin-Pot Clavulanate] Diarrhea and Nausea And Vomiting  . Penicillins Nausea And Vomiting and Other (See Comments)    Has patient had a PCN reaction causing immediate rash, facial/tongue/throat swelling, SOB or lightheadedness with hypotension: Yes Has patient had a PCN reaction causing severe rash involving mucus membranes or skin necrosis: No Has patient had a PCN reaction that required hospitalization: No Has patient had a PCN reaction occurring within the last 10 years: Yes If all of the above answers  are "NO", then may proceed with Cephalosporin use.     Current Outpatient Medications  Medication Sig Dispense Refill  . ALPRAZolam (XANAX) 0.5 MG tablet Take 0.5 mg by mouth at bedtime as needed for anxiety or sleep.     . Ascorbic Acid (VITAMIN C) 1000 MG tablet Take 1,000 mg by mouth daily.      . cholecalciferol (VITAMIN D) 1000 units tablet Take 1,000 Units by mouth daily.    [name] escitalopram (LEXAPRO) 10 MG tablet Take 10 mg by mouth daily.     [name] ibuprofen (ADVIL,MOTRIN) 200 MG tablet Take 400 mg by mouth every 6 (six) hours as needed for headache or moderate pain.     [name] prochlorperazine (COMPAZINE) 10 MG tablet Take 1 tablet (10 mg total) by mouth every 6 (six) hours as needed for nausea or vomiting. 30 tablet 0   No current facility-administered medications for this visit.        Physical Exam: BP (!) 95/56   Pulse 75   Resp 20   Ht 5\' 5"  (1.651 m)   Wt 115 lb (52.2 kg)   SpO2 98% Comment: RA  BMI 19.14 kg/m  General appearance: alert, cooperative and no distress Head: Normocephalic, without obvious abnormality, atraumatic Neck: no adenopathy, no carotid bruit, no JVD, supple, symmetrical, trachea  midline and thyroid not enlarged, symmetric, no tenderness/mass/nodules Lymph nodes: Cervical, supraclavicular, and axillary nodes normal. Resp: clear to auscultation bilaterally Cardio: regular rate and rhythm, S1, S2 normal, no murmur, click, rub or gallop GI: soft, non-tender; bowel sounds normal; no masses,  no organomegaly Extremities: extremities normal, atraumatic, no cyanosis or edema Neurologic: Grossly normal Left chest incisions are well-healed  Diagnostic Studies & Laboratory data:         Recent Radiology Findings:   Ct Chest W Contrast  Result Date: 02/09/2018 CLINICAL DATA:  Followup small cell lung cancer EXAM: CT CHEST WITH CONTRAST TECHNIQUE: Multidetector CT imaging of the chest was performed during intravenous contrast administration. CONTRAST:  29mL OMNIPAQUE IOHEXOL 300 MG/ML  SOLN COMPARISON:  CT chest 08/26/17 FINDINGS: Cardiovascular: The heart size appears within normal limits. No pericardial effusion identified. Aortic atherosclerosis. Mediastinum/Nodes: Normal appearance of the thyroid gland. The trachea appears patent and is midline. Normal appearance of the esophagus. No mediastinal or hilar adenopathy. Lungs/Pleura: Postoperative change from a left lower lobectomy identified. There are no complicating features identified. No suspicious pulmonary nodule or mass identified Upper Abdomen: No acute abnormality. Musculoskeletal: There is a mild scoliosis deformity with multi level degenerative disc disease. Healing left lateral rib fracture deformities noted. No chest wall abnormality. No suspicious bone lesions identified. IMPRESSION: 1. Status post left lower lobectomy. No findings identified to suggest recurrent or metastatic disease. 2.  Aortic Atherosclerosis (ICD10-I70.0). Electronically Signed   By: Kerby Moors M.D.   On: 02/09/2018 09:04     I have independently reviewed the above radiology findings and reviewed findings  with the patient.  Recent Labs: Lab  Results  Component Value Date   WBC 3.9 02/09/2018   HGB 9.1 (L) 02/09/2018   HCT 26.2 (L) 02/09/2018   PLT 292 02/09/2018   GLUCOSE 104 (H) 02/09/2018   ALT 10 02/09/2018   AST 13 (L) 02/09/2018   NA 137 02/09/2018   K 4.4 02/09/2018   CL 101 02/09/2018   CREATININE 0.93 02/09/2018   BUN 12 02/09/2018   CO2 26 02/09/2018   INR 0.98 09/25/2017      Assessment / Plan:  Patient progressing well following left lower lobectomy for what turned out to be small cell carcinoma of the lung, in a peripheral nodule Recent CT scan shows no evidence of recurrence  plan to see the patient back in 6 months  Grace Isaac 02/12/2018 9:45 AM

## 2018-02-16 ENCOUNTER — Inpatient Hospital Stay: Payer: 59 | Admitting: Internal Medicine

## 2018-02-16 ENCOUNTER — Encounter: Payer: Self-pay | Admitting: Internal Medicine

## 2018-02-16 VITALS — BP 124/71 | HR 79 | Temp 98.4°F | Resp 19 | Ht 65.0 in | Wt 114.7 lb

## 2018-02-16 DIAGNOSIS — R5383 Other fatigue: Secondary | ICD-10-CM

## 2018-02-16 DIAGNOSIS — C3432 Malignant neoplasm of lower lobe, left bronchus or lung: Secondary | ICD-10-CM | POA: Diagnosis not present

## 2018-02-16 NOTE — Progress Notes (Signed)
[facility] Telephone:(336) [phone]   Fax:(336) [phone]  OFFICE PROGRESS NOTE  [name], MD [address] [place] [address]  DIAGNOSIS: Limited stage (T2 a, N0, M0) of small cell lung cancer presented with left lower lobe lung mass status post left lower lobectomy with lymph node dissection on September 30, 2017.  PRIOR THERAPY: None.  CURRENT THERAPY: Adjuvant systemic chemotherapy with 4 cycles of cisplatin 80 mg/M2 on day 1 and etoposide 100 mg/M2 on days 1, 2 and 3 every 3 weeks.  First dose expected Nov 03, 2017.  Status post 3 cycles.  INTERVAL HISTORY: ALAUNA HAYDEN 62 y.o. female returns to the clinic today for follow-up visit.  The patient is feeling fine today with no concerning complaints.  She is recovering well from her recent systemic chemotherapy.  She continues to have mild fatigue.  She denied having any current chest pain, shortness of breath, cough or hemoptysis.  She denied having any fever or chills.  She has no nausea, vomiting, diarrhea or constipation.  She had repeat CT scan of the chest performed recently and she is here for evaluation and discussion of her risk her results.  MEDICAL HISTORY: Past Medical History:  Diagnosis Date  . Allergy   . Anxiety   . Depression   . Hyperlipemia   . Lung mass   . Shingles     ALLERGIES:  is allergic to augmentin [amoxicillin-pot clavulanate] and penicillins.  MEDICATIONS:  Current Outpatient Medications  Medication Sig Dispense Refill  . Ascorbic Acid (VITAMIN C) 1000 MG tablet Take 1,000 mg by mouth daily.      . cholecalciferol (VITAMIN D) 1000 units tablet Take 1,000 Units by mouth daily.    [name] escitalopram (LEXAPRO) 10 MG tablet Take 10 mg by mouth daily.     [name] ALPRAZolam (XANAX) 0.5 MG tablet Take 0.5 mg by mouth at bedtime as needed for anxiety or sleep.     [name] ibuprofen (ADVIL,MOTRIN) 200 MG tablet Take 400 mg by mouth every 6 (six) hours as needed for headache or  moderate pain.     [name] prochlorperazine (COMPAZINE) 10 MG tablet Take 1 tablet (10 mg total) by mouth every 6 (six) hours as needed for nausea or vomiting. (Patient not taking: Reported on 02/16/2018) 30 tablet 0   No current facility-administered medications for this visit.     SURGICAL HISTORY:  Past Surgical History:  Procedure Laterality Date  . DENTAL SURGERY  2010   implant  . NODE DISSECTION Left 09/30/2017   Procedure: NODE DISSECTION;  Surgeon: Grace Isaac, MD;  Location: [place];  Service: Thoracic;  Laterality: Left;  . RIGHT OOPHORECTOMY    . TONSILLECTOMY AND ADENOIDECTOMY    . VIDEO ASSISTED THORACOSCOPY (VATS)/WEDGE RESECTION Left 09/30/2017   Procedure: VIDEO ASSISTED THORACOSCOPY (VATS)/LUNG RESECTION;  Surgeon: Grace Isaac, MD;  Location: [place];  Service: Thoracic;  Laterality: Left;  [name] VIDEO BRONCHOSCOPY N/A 09/30/2017   Procedure: VIDEO BRONCHOSCOPY;  Surgeon: Grace Isaac, MD;  Location: [facility] OR;  Service: Thoracic;  Laterality: N/A;  . WISDOM TOOTH EXTRACTION      REVIEW OF SYSTEMS:  Constitutional: positive for fatigue Eyes: negative Ears, nose, mouth, throat, and face: negative Respiratory: negative Cardiovascular: negative Gastrointestinal: negative Genitourinary:negative Integument/breast: negative Hematologic/lymphatic: negative Musculoskeletal:negative Neurological: negative Behavioral/Psych: negative Endocrine: negative Allergic/Immunologic: negative   PHYSICAL EXAMINATION: General appearance: alert, cooperative and no distress Head: Normocephalic, without obvious abnormality, atraumatic Neck: no  adenopathy, no JVD, supple, symmetrical, trachea midline and thyroid not enlarged, symmetric, no tenderness/mass/nodules Lymph nodes: Cervical, supraclavicular, and axillary nodes normal. Resp: clear to auscultation bilaterally Back: symmetric, no curvature. ROM normal. No CVA tenderness. Cardio: regular rate and rhythm, S1, S2 normal, no murmur,  click, rub or gallop GI: soft, non-tender; bowel sounds normal; no masses,  no organomegaly Extremities: extremities normal, atraumatic, no cyanosis or edema Neurologic: Alert and oriented X 3, normal strength and tone. Normal symmetric reflexes. Normal coordination and gait  ECOG PERFORMANCE STATUS: 0 - Asymptomatic  Blood pressure 124/71, pulse 79, temperature 98.4 F (36.9 C), temperature source Oral, resp. rate 19, height 5\' 5"  (1.651 m), weight 114 lb 11.2 oz (52 kg), SpO2 100 %.  LABORATORY DATA: Lab Results  Component Value Date   WBC 3.9 02/09/2018   HGB 9.1 (L) 02/09/2018   HCT 26.2 (L) 02/09/2018   MCV 91.4 02/09/2018   PLT 292 02/09/2018      Chemistry      Component Value Date/Time   NA 137 02/09/2018 0749   K 4.4 02/09/2018 0749   CL 101 02/09/2018 0749   CO2 26 02/09/2018 0749   BUN 12 02/09/2018 0749   CREATININE 0.93 02/09/2018 0749      Component Value Date/Time   CALCIUM 9.0 02/09/2018 0749   ALKPHOS 61 02/09/2018 0749   AST 13 (L) 02/09/2018 0749   ALT 10 02/09/2018 0749   BILITOT 0.3 02/09/2018 0749       RADIOGRAPHIC STUDIES: Ct Chest W Contrast  Result Date: 02/09/2018 CLINICAL DATA:  Followup small cell lung cancer EXAM: CT CHEST WITH CONTRAST TECHNIQUE: Multidetector CT imaging of the chest was performed during intravenous contrast administration. CONTRAST:  36mL OMNIPAQUE IOHEXOL 300 MG/ML  SOLN COMPARISON:  CT chest 08/26/17 FINDINGS: Cardiovascular: The heart size appears within normal limits. No pericardial effusion identified. Aortic atherosclerosis. Mediastinum/Nodes: Normal appearance of the thyroid gland. The trachea appears patent and is midline. Normal appearance of the esophagus. No mediastinal or hilar adenopathy. Lungs/Pleura: Postoperative change from a left lower lobectomy identified. There are no complicating features identified. No suspicious pulmonary nodule or mass identified Upper Abdomen: No acute abnormality. Musculoskeletal:  There is a mild scoliosis deformity with multi level degenerative disc disease. Healing left lateral rib fracture deformities noted. No chest wall abnormality. No suspicious bone lesions identified. IMPRESSION: 1. Status post left lower lobectomy. No findings identified to suggest recurrent or metastatic disease. 2.  Aortic Atherosclerosis (ICD10-I70.0). Electronically Signed   By: Kerby Moors M.D.   On: 02/09/2018 09:04    ASSESSMENT AND PLAN: This is a very pleasant 62 years old white female recently diagnosed with limited stage small cell lung cancer status post surgical resection.   She completed a course of adjuvant treatment with systemic chemotherapy with cisplatin and etoposide status post 4 cycles.   The patient tolerated her treatment well except for fatigue. She had repeat CT scan of the chest performed recently.  I personally and independently reviewed the scans and discussed the results with the patient today. I recommended for her to continue on observation with repeat CT scan of the chest in 3 months. We discussed briefly the role of prophylactic cranial irradiation for patient with limited stage small cell lung cancer and the patient would like to continue on observation for now and treat if she has any evidence of brain metastasis. She was advised to call immediately if she has any concerning symptoms in the interval. The patient voices understanding  of current disease status and treatment options and is in agreement with the current care plan.  All questions were answered. The patient knows to call the clinic with any problems, questions or concerns. We can certainly see the patient much sooner if necessary.  Disclaimer: This note was dictated with voice recognition software. Similar sounding words can inadvertently be transcribed and may not be corrected upon review.

## 2018-02-17 ENCOUNTER — Telehealth: Payer: Self-pay | Admitting: Internal Medicine

## 2018-02-17 NOTE — Telephone Encounter (Signed)
Tried to call I did leave a message

## 2018-02-17 NOTE — Telephone Encounter (Signed)
Scheduled appt per 8/26 los - sent reminder letter in the mail with appt date and time.

## 2018-05-18 ENCOUNTER — Inpatient Hospital Stay: Payer: 59 | Attending: Internal Medicine

## 2018-05-18 ENCOUNTER — Ambulatory Visit (HOSPITAL_COMMUNITY)
Admission: RE | Admit: 2018-05-18 | Discharge: 2018-05-18 | Disposition: A | Discharge status: Home / Self Care | Payer: 59 | Source: Ambulatory Visit | Attending: Internal Medicine | Admitting: Internal Medicine

## 2018-05-18 ENCOUNTER — Other Ambulatory Visit: Payer: 59

## 2018-05-18 DIAGNOSIS — C3432 Malignant neoplasm of lower lobe, left bronchus or lung: Secondary | ICD-10-CM | POA: Insufficient documentation

## 2018-05-18 DIAGNOSIS — Z79899 Other long term (current) drug therapy: Secondary | ICD-10-CM | POA: Diagnosis not present

## 2018-05-18 DIAGNOSIS — R5383 Other fatigue: Secondary | ICD-10-CM | POA: Insufficient documentation

## 2018-05-18 LAB — CBC WITH DIFFERENTIAL (CANCER CENTER ONLY)
Abs Immature Granulocytes: 0.01 10*3/uL (ref 0.00–0.07)
Basophils Absolute: 0 10*3/uL (ref 0.0–0.1)
Basophils Relative: 1 %
Eosinophils Absolute: 0.1 10*3/uL (ref 0.0–0.5)
Eosinophils Relative: 2 %
HCT: 34.2 % — ABNORMAL LOW (ref 36.0–46.0)
Hemoglobin: 11.3 g/dL — ABNORMAL LOW (ref 12.0–15.0)
Immature Granulocytes: 0 %
Lymphocytes Relative: 34 %
Lymphs Abs: 2 10*3/uL (ref 0.7–4.0)
MCH: 31.1 pg (ref 26.0–34.0)
MCHC: 33 g/dL (ref 30.0–36.0)
MCV: 94.2 fL (ref 80.0–100.0)
Monocytes Absolute: 0.6 10*3/uL (ref 0.1–1.0)
Monocytes Relative: 11 %
Neutro Abs: 3.1 10*3/uL (ref 1.7–7.7)
Neutrophils Relative %: 52 %
Platelet Count: 180 10*3/uL (ref 150–400)
RBC: 3.63 MIL/uL — ABNORMAL LOW (ref 3.87–5.11)
RDW: 12.4 % (ref 11.5–15.5)
WBC Count: 5.8 10*3/uL (ref 4.0–10.5)
nRBC: 0 % (ref 0.0–0.2)

## 2018-05-18 LAB — CMP (CANCER CENTER ONLY)
ALT: 16 U/L (ref 0–44)
AST: 23 U/L (ref 15–41)
Albumin: 4.4 g/dL (ref 3.5–5.0)
Alkaline Phosphatase: 71 U/L (ref 38–126)
Anion gap: 11 (ref 5–15)
BUN: 17 mg/dL (ref 8–23)
CO2: 27 mmol/L (ref 22–32)
Calcium: 9.7 mg/dL (ref 8.9–10.3)
Chloride: 102 mmol/L (ref 98–111)
Creatinine: 1.01 mg/dL — ABNORMAL HIGH (ref 0.44–1.00)
GFR, Est AFR Am: >60 mL/min (ref >60)
GFR, Estimated: 59 mL/min — ABNORMAL LOW (ref >60)
Glucose, Bld: 94 mg/dL (ref 70–99)
Potassium: 4.9 mmol/L (ref 3.5–5.1)
Sodium: 140 mmol/L (ref 135–145)
Total Bilirubin: 0.4 mg/dL (ref 0.3–1.2)
Total Protein: 7.2 g/dL (ref 6.5–8.1)

## 2018-05-18 MED ORDER — SODIUM CHLORIDE (PF) 0.9 % IJ SOLN
INTRAMUSCULAR | Status: AC
  Filled 2018-05-18: qty 50

## 2018-05-18 MED ORDER — IOHEXOL 300 MG/ML  SOLN
75.0000 mL | Freq: Once | INTRAMUSCULAR | Status: AC | PRN
  Administered 2018-05-18: 75 mL via INTRAVENOUS

## 2018-05-20 ENCOUNTER — Encounter: Payer: Self-pay | Admitting: Internal Medicine

## 2018-05-20 ENCOUNTER — Ambulatory Visit (HOSPITAL_COMMUNITY): Payer: 59

## 2018-05-20 ENCOUNTER — Inpatient Hospital Stay: Payer: 59 | Admitting: Internal Medicine

## 2018-05-20 VITALS — BP 130/75 | HR 73 | Temp 98.5°F | Resp 18 | Ht 65.0 in | Wt 126.3 lb

## 2018-05-20 DIAGNOSIS — R5383 Other fatigue: Secondary | ICD-10-CM

## 2018-05-20 DIAGNOSIS — C3432 Malignant neoplasm of lower lobe, left bronchus or lung: Secondary | ICD-10-CM

## 2018-05-20 NOTE — Progress Notes (Signed)
[facility] Telephone:(336) [phone]   Fax:(336) [phone]  OFFICE PROGRESS NOTE  [name], MD [address] [place] [address]  DIAGNOSIS: Limited stage (T2 a, N0, M0) of small cell lung cancer presented with left lower lobe lung mass status post left lower lobectomy with lymph node dissection on September 30, 2017.  PRIOR THERAPY:  1) status post left lower lobectomy with lymph node dissection on September 30, 2017. 2) Adjuvant systemic chemotherapy with 4 cycles of cisplatin 80 mg/M2 on day 1 and etoposide 100 mg/M2 on days 1, 2 and 3 every 3 weeks.  First dose Nov 03, 2017.  Status post 4 cycles.  Last dose was given January 12, 2018.  CURRENT THERAPY: Observation.  INTERVAL HISTORY: KEMARIA DEDIC 62 y.o. female returns to the clinic today for follow-up visit accompanied by her husband.  The patient is feeling fine today with no concerning complaints.  She denied having any significant chest pain, shortness of breath, cough or hemoptysis.  She denied having any fever or chills.  She has no nausea, vomiting, diarrhea or constipation.  She has no headache or visual changes.  She is here today for evaluation with repeat CT scan of the chest for restaging of her disease.   MEDICAL HISTORY: Past Medical History:  Diagnosis Date  . Allergy   . Anxiety   . Depression   . Hyperlipemia   . Lung mass   . Shingles     ALLERGIES:  is allergic to augmentin [amoxicillin-pot clavulanate] and penicillins.  MEDICATIONS:  Current Outpatient Medications  Medication Sig Dispense Refill  . ALPRAZolam (XANAX) 0.5 MG tablet Take 0.5 mg by mouth at bedtime as needed for anxiety or sleep.     . Ascorbic Acid (VITAMIN C) 1000 MG tablet Take 1,000 mg by mouth daily.      . cholecalciferol (VITAMIN D) 1000 units tablet Take 1,000 Units by mouth daily.    [name] escitalopram (LEXAPRO) 10 MG tablet Take 10 mg by mouth daily.     [name] ibuprofen (ADVIL,MOTRIN) 200 MG tablet Take 400  mg by mouth every 6 (six) hours as needed for headache or moderate pain.     [name] prochlorperazine (COMPAZINE) 10 MG tablet Take 1 tablet (10 mg total) by mouth every 6 (six) hours as needed for nausea or vomiting. (Patient not taking: Reported on 02/16/2018) 30 tablet 0   No current facility-administered medications for this visit.     SURGICAL HISTORY:  Past Surgical History:  Procedure Laterality Date  . DENTAL SURGERY  2010   implant  . NODE DISSECTION Left 09/30/2017   Procedure: NODE DISSECTION;  Surgeon: Grace Isaac, MD;  Location: [place];  Service: Thoracic;  Laterality: Left;  . RIGHT OOPHORECTOMY    . TONSILLECTOMY AND ADENOIDECTOMY    . VIDEO ASSISTED THORACOSCOPY (VATS)/WEDGE RESECTION Left 09/30/2017   Procedure: VIDEO ASSISTED THORACOSCOPY (VATS)/LUNG RESECTION;  Surgeon: Grace Isaac, MD;  Location: [place];  Service: Thoracic;  Laterality: Left;  [name] VIDEO BRONCHOSCOPY N/A 09/30/2017   Procedure: VIDEO BRONCHOSCOPY;  Surgeon: Grace Isaac, MD;  Location: [facility];  Service: Thoracic;  Laterality: N/A;  . WISDOM TOOTH EXTRACTION      REVIEW OF SYSTEMS:  A comprehensive review of systems was negative.   PHYSICAL EXAMINATION: General appearance: alert, cooperative and no distress Head: Normocephalic, without obvious abnormality, atraumatic Neck: no adenopathy, no JVD, supple, symmetrical, trachea midline and thyroid not enlarged, symmetric,  no tenderness/mass/nodules Lymph nodes: Cervical, supraclavicular, and axillary nodes normal. Resp: clear to auscultation bilaterally Back: symmetric, no curvature. ROM normal. No CVA tenderness. Cardio: regular rate and rhythm, S1, S2 normal, no murmur, click, rub or gallop GI: soft, non-tender; bowel sounds normal; no masses,  no organomegaly Extremities: extremities normal, atraumatic, no cyanosis or edema  ECOG PERFORMANCE STATUS: 0 - Asymptomatic  Blood pressure 130/75, pulse 73, temperature 98.5 F (36.9 C), temperature  source Oral, resp. rate 18, height 5\' 5"  (1.651 m), weight 126 lb 4.8 oz (57.3 kg), SpO2 99 %.  LABORATORY DATA: Lab Results  Component Value Date   WBC 5.8 05/18/2018   HGB 11.3 (L) 05/18/2018   HCT 34.2 (L) 05/18/2018   MCV 94.2 05/18/2018   PLT 180 05/18/2018      Chemistry      Component Value Date/Time   NA 140 05/18/2018 1302   K 4.9 05/18/2018 1302   CL 102 05/18/2018 1302   CO2 27 05/18/2018 1302   BUN 17 05/18/2018 1302   CREATININE 1.01 (H) 05/18/2018 1302      Component Value Date/Time   CALCIUM 9.7 05/18/2018 1302   ALKPHOS 71 05/18/2018 1302   AST 23 05/18/2018 1302   ALT 16 05/18/2018 1302   BILITOT 0.4 05/18/2018 1302       RADIOGRAPHIC STUDIES: Ct Chest W Contrast  Result Date: 05/19/2018 CLINICAL DATA:  Left lower lobe small cell lung cancer EXAM: CT CHEST WITH CONTRAST TECHNIQUE: Multidetector CT imaging of the chest was performed during intravenous contrast administration. CONTRAST:  42mL OMNIPAQUE IOHEXOL 300 MG/ML  SOLN COMPARISON:  02/09/2018 FINDINGS: Cardiovascular: The heart is normal in size. No pericardial effusion. No evidence of thoracic aortic aneurysm. Mild atherosclerotic calcifications of the aortic arch. Mediastinum/Nodes: No suspicious mediastinal lymphadenopathy. Small mediastinal lymph nodes, including a 6 mm short axis low right paratracheal node and a 6 mm short axis prevascular node (series 2/image 86), unchanged. Visualized thyroid is unremarkable. Lungs/Pleura: Status post left lower lobectomy. 3 mm subpleural nodule in the posterior left upper lobe (series 7/image 27), unchanged. Additional 3 mm subpleural nodule in the posterior left upper lobe (series 7/image 39), unchanged. No focal consolidation. No pleural effusion or pneumothorax. Upper Abdomen: Visualized upper abdomen is unremarkable. Musculoskeletal: Mild degenerative changes of the visualized thoracolumbar spine. IMPRESSION: Status post left lower lobectomy. No findings  suspicious for recurrent or metastatic disease. Two 3 mm left upper lobe nodules are unchanged, likely benign. Aortic Atherosclerosis (ICD10-I70.0). Electronically Signed   By: Julian Hy M.D.   On: 05/19/2018 08:19    ASSESSMENT AND PLAN: This is a very pleasant 62 years old white female recently diagnosed with limited stage small cell lung cancer status post surgical resection.   She completed a course of adjuvant treatment with systemic chemotherapy with cisplatin and etoposide status post 4 cycles.   The patient tolerated her treatment well except for fatigue. She is currently on observation and she is feeling fine. The patient had a repeat CT scan of the chest performed recently.  I personally and independently reviewed the scans and discussed the results with the patient today.  Her scan showed no concerning findings for disease recurrence or progression. I recommended for the patient to continue on observation for now with repeat CT scan of the chest in 3 months. She was advised to call immediately if she has any concerning symptoms in the interval. The patient voices understanding of current disease status and treatment options and is in agreement  with the current care plan.  All questions were answered. The patient knows to call the clinic with any problems, questions or concerns. We can certainly see the patient much sooner if necessary.  Disclaimer: This note was dictated with voice recognition software. Similar sounding words can inadvertently be transcribed and may not be corrected upon review.       

## 2018-05-25 ENCOUNTER — Telehealth: Payer: Self-pay | Admitting: Internal Medicine

## 2018-05-25 NOTE — Telephone Encounter (Signed)
Scheduled appt per 11/27 los - sent reminder letter in the mail with appt date and time

## 2018-08-03 ENCOUNTER — Other Ambulatory Visit: Payer: Self-pay | Admitting: Family Medicine

## 2018-08-03 DIAGNOSIS — Z1231 Encounter for screening mammogram for malignant neoplasm of breast: Secondary | ICD-10-CM

## 2018-08-13 ENCOUNTER — Other Ambulatory Visit: Payer: Self-pay

## 2018-08-13 ENCOUNTER — Ambulatory Visit: Payer: 59 | Admitting: Cardiothoracic Surgery

## 2018-08-13 ENCOUNTER — Encounter: Payer: Self-pay | Admitting: Cardiothoracic Surgery

## 2018-08-13 VITALS — BP 120/78 | HR 76 | Resp 18 | Ht 65.0 in | Wt 132.0 lb

## 2018-08-13 DIAGNOSIS — Z902 Acquired absence of lung [part of]: Secondary | ICD-10-CM | POA: Diagnosis not present

## 2018-08-13 DIAGNOSIS — C3432 Malignant neoplasm of lower lobe, left bronchus or lung: Secondary | ICD-10-CM | POA: Diagnosis not present

## 2018-08-13 NOTE — Progress Notes (Signed)
[address]       [place] [address]             [phone]                  Brandi Crosby [place] Medical Record #[id number] Date of Birth: 07-Feb-1956  Referring FX:[email or web address], Annie Main, MD Primary Cardiology: Primary Care:Meyers, Annie Main, MD  Chief Complaint:  Follow Up Visit 09/30/2017 OPERATIVE REPORT PREOPERATIVE DIAGNOSIS:  Left lower lobe lung mass. POSTOPERATIVE DIAGNOSIS:  Left lower lobe lung malignancy, small cell carcinoma. PROCEDURE PERFORMED:  Video bronchoscopy with brushings, left video- assisted thoracoscopy, mini thoracotomy, left lower lobectomy with lymph node dissection and placement of On-Q device. 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    Limited stage (T2 a,N0, M0) of small cell lung cancer presented with left lower lobe lung mass status post left lower lobectomy with lymph node dissection on September 30, 2017.  PRIOR THERAPY:  1) status post left lower lobectomy with lymph node dissection on September 30, 2017. 2) Adjuvant systemic chemotherapy with 4 cycles of cisplatin 80 mg/M2 on day 1 and etoposide 100 mg/M2 on days 1, 2 and3every 3 weeks.  First dose Nov 03, 2017.  Status post 4 cycles.  Last dose was given January 12, 2018.  CURRENT THERAPY: Observation.  Patient doing well. Complaint of right knee pain swelling behind knee       Zubrod Score: At the time of surgery this patient's most appropriate activity status/level should be described as: [x]     0    Normal activity, no symptoms []     1     Restricted in physical strenuous activity but ambulatory, able to do out light work []     2    Ambulatory and capable of self care, unable to do work activities, up and about                 >50 % of waking hours                                                                                   []     3    Only limited self care, in bed greater than 50% of waking hours []     4    Completely disabled, no self care, confined to bed or chair []     5    Moribund  Social History   Tobacco Use  Smoking Status Former Smoker  . Packs/day: 0.25  . Years: 15.00  . Pack years: 3.75  . Types: Cigarettes  . Last attempt to quit: 09/28/2017  . Years since quitting: 0.8  Smokeless Tobacco Never Used  Allergies  Allergen Reactions  . Augmentin [Amoxicillin-Pot Clavulanate] Diarrhea and Nausea And Vomiting  . Penicillins Nausea And Vomiting and Other (See Comments)    Has patient had a PCN reaction causing immediate rash, facial/tongue/throat swelling, SOB or lightheadedness with hypotension: Yes Has patient had a PCN reaction causing severe rash involving mucus membranes or skin necrosis: No Has patient had a PCN reaction that required hospitalization: No Has patient had a PCN reaction occurring within the last 10 years: Yes If all of the above answers are "NO", then may proceed with Cephalosporin use.     Current Outpatient Medications  Medication Sig Dispense Refill  . ALPRAZolam (XANAX) 0.5 MG tablet Take 0.5 mg by mouth at bedtime as needed for anxiety or sleep.     . Ascorbic Acid (VITAMIN C) 1000 MG tablet Take 1,000 mg by mouth daily.      . cholecalciferol (VITAMIN D) 1000 units tablet Take 1,000 Units by mouth daily.    [name] escitalopram (LEXAPRO) 10 MG tablet Take 10 mg by mouth daily.     [name] ibuprofen (ADVIL,MOTRIN) 200 MG tablet Take 400 mg by mouth every 6 (six) hours as needed for headache or moderate pain.     [name] prochlorperazine (COMPAZINE) 10 MG tablet Take 1 tablet (10 mg total)  by mouth every 6 (six) hours as needed for nausea or vomiting. 30 tablet 0   No current facility-administered medications for this visit.        Physical Exam: BP 120/78 (BP Location: Left Arm, Patient Position: Sitting, Cuff Size: Normal)   Pulse 76   Resp 18   Ht 5\' 5"  (1.651 m)   Wt 132 lb (59.9 kg)   SpO2 96% Comment: RA  BMI 21.97 kg/m  General appearance: alert and appears stated age Head: Normocephalic, without obvious abnormality, atraumatic Neck: no adenopathy, no carotid bruit, no JVD, supple, symmetrical, trachea midline and thyroid not enlarged, symmetric, no tenderness/mass/nodules Lymph nodes: Cervical, supraclavicular, and axillary nodes normal. Resp: clear to auscultation bilaterally Back: symmetric, no curvature. ROM normal. No CVA tenderness. Cardio: regular rate and rhythm, S1, S2 normal, no murmur, click, rub or gallop GI: soft, non-tender; bowel sounds normal; no masses,  no organomegaly Extremities: extremities normal, atraumatic, no cyanosis or edema, Palpable bakers cyst right knee, non pulsatile  Neurologic: Grossly normal   Diagnostic Studies & Laboratory data:         Recent Radiology Findings: CLINICAL DATA:  Left lower lobe small cell lung cancer  EXAM: CT CHEST WITH CONTRAST  TECHNIQUE: Multidetector CT imaging of the chest was performed during intravenous contrast administration.  CONTRAST:  85mL OMNIPAQUE IOHEXOL 300 MG/ML  SOLN  COMPARISON:  02/09/2018  FINDINGS: Cardiovascular: The heart is normal in size. No pericardial effusion.  No evidence of thoracic aortic aneurysm. Mild atherosclerotic calcifications of the aortic arch.  Mediastinum/Nodes: No suspicious mediastinal lymphadenopathy. Small mediastinal lymph nodes, including a 6 mm short axis low right paratracheal node and a 6 mm short axis prevascular node (series 2/image 86), unchanged.  Visualized thyroid is unremarkable.  Lungs/Pleura: Status post left  lower lobectomy.  3 mm subpleural nodule in the posterior left upper lobe (series 7/image 27), unchanged. Additional 3 mm subpleural nodule in the posterior left upper lobe (series 7/image 39), unchanged.  No focal consolidation.  No pleural effusion or pneumothorax.  Upper Abdomen: Visualized upper abdomen is unremarkable.  Musculoskeletal: Mild degenerative changes of the visualized thoracolumbar spine.  IMPRESSION: Status post left lower lobectomy.  No findings  suspicious for recurrent or metastatic disease.  Two 3 mm left upper lobe nodules are unchanged, likely benign.  Aortic Atherosclerosis (ICD10-I70.0).   Electronically Signed   By: Julian Hy M.D.   On: 05/19/2018 08:19     Recent Labs: Lab Results  Component Value Date   WBC 5.8 05/18/2018   HGB 11.3 (L) 05/18/2018   HCT 34.2 (L) 05/18/2018   PLT 180 05/18/2018   GLUCOSE 94 05/18/2018   ALT 16 05/18/2018   AST 23 05/18/2018   NA 140 05/18/2018   K 4.9 05/18/2018   CL 102 05/18/2018   CREATININE 1.01 (H) 05/18/2018   BUN 17 05/18/2018   CO2 27 05/18/2018   INR 0.98 09/25/2017      Assessment / Plan:   Stable without evidence of recurrence of small cell cancer Right Bakers Cyst - [place] and referral to orthopedics Return 6 months, ct per oncology        Grace Isaac 08/13/2018 9:54 AM

## 2018-08-17 ENCOUNTER — Inpatient Hospital Stay: Payer: 59 | Attending: Internal Medicine

## 2018-08-17 ENCOUNTER — Ambulatory Visit: Payer: 59

## 2018-08-17 ENCOUNTER — Other Ambulatory Visit: Payer: Self-pay | Admitting: *Deleted

## 2018-08-17 ENCOUNTER — Ambulatory Visit (HOSPITAL_COMMUNITY)
Admission: RE | Admit: 2018-08-17 | Discharge: 2018-08-17 | Disposition: A | Discharge status: Home / Self Care | Payer: 59 | Source: Ambulatory Visit | Attending: Internal Medicine | Admitting: Internal Medicine

## 2018-08-17 DIAGNOSIS — Z85118 Personal history of other malignant neoplasm of bronchus and lung: Secondary | ICD-10-CM | POA: Insufficient documentation

## 2018-08-17 DIAGNOSIS — M7122 Synovial cyst of popliteal space [Baker], left knee: Secondary | ICD-10-CM

## 2018-08-17 DIAGNOSIS — C3432 Malignant neoplasm of lower lobe, left bronchus or lung: Secondary | ICD-10-CM

## 2018-08-17 LAB — CBC WITH DIFFERENTIAL (CANCER CENTER ONLY)
Abs Immature Granulocytes: 0.01 10*3/uL (ref 0.00–0.07)
Basophils Absolute: 0 10*3/uL (ref 0.0–0.1)
Basophils Relative: 1 %
Eosinophils Absolute: 0.1 10*3/uL (ref 0.0–0.5)
Eosinophils Relative: 2 %
HCT: 38.3 % (ref 36.0–46.0)
Hemoglobin: 12.9 g/dL (ref 12.0–15.0)
Immature Granulocytes: 0 %
Lymphocytes Relative: 32 %
Lymphs Abs: 1.5 10*3/uL (ref 0.7–4.0)
MCH: 32 pg (ref 26.0–34.0)
MCHC: 33.7 g/dL (ref 30.0–36.0)
MCV: 95 fL (ref 80.0–100.0)
Monocytes Absolute: 0.4 10*3/uL (ref 0.1–1.0)
Monocytes Relative: 10 %
Neutro Abs: 2.6 10*3/uL (ref 1.7–7.7)
Neutrophils Relative %: 55 %
Platelet Count: 198 10*3/uL (ref 150–400)
RBC: 4.03 MIL/uL (ref 3.87–5.11)
RDW: 12.4 % (ref 11.5–15.5)
WBC Count: 4.6 10*3/uL (ref 4.0–10.5)
nRBC: 0 % (ref 0.0–0.2)

## 2018-08-17 LAB — CMP (CANCER CENTER ONLY)
ALT: 18 U/L (ref 0–44)
AST: 22 U/L (ref 15–41)
Albumin: 4.6 g/dL (ref 3.5–5.0)
Alkaline Phosphatase: 70 U/L (ref 38–126)
Anion gap: 11 (ref 5–15)
BUN: 17 mg/dL (ref 8–23)
CO2: 26 mmol/L (ref 22–32)
Calcium: 10 mg/dL (ref 8.9–10.3)
Chloride: 102 mmol/L (ref 98–111)
Creatinine: 1.05 mg/dL — ABNORMAL HIGH (ref 0.44–1.00)
GFR, Est AFR Am: >60 mL/min (ref >60)
GFR, Estimated: 57 mL/min — ABNORMAL LOW (ref >60)
Glucose, Bld: 102 mg/dL — ABNORMAL HIGH (ref 70–99)
Potassium: 4.6 mmol/L (ref 3.5–5.1)
Sodium: 139 mmol/L (ref 135–145)
Total Bilirubin: 0.6 mg/dL (ref 0.3–1.2)
Total Protein: 7.7 g/dL (ref 6.5–8.1)

## 2018-08-17 MED ORDER — SODIUM CHLORIDE (PF) 0.9 % IJ SOLN
INTRAMUSCULAR | Status: AC
  Filled 2018-08-17: qty 50

## 2018-08-17 MED ORDER — IOHEXOL 300 MG/ML  SOLN
75.0000 mL | Freq: Once | INTRAMUSCULAR | Status: AC | PRN
  Administered 2018-08-17: 75 mL via INTRAVENOUS

## 2018-08-19 ENCOUNTER — Telehealth: Payer: Self-pay | Admitting: Internal Medicine

## 2018-08-19 ENCOUNTER — Encounter: Payer: Self-pay | Admitting: Internal Medicine

## 2018-08-19 ENCOUNTER — Inpatient Hospital Stay: Payer: 59 | Admitting: Internal Medicine

## 2018-08-19 VITALS — BP 130/86 | HR 67 | Temp 98.1°F | Resp 18 | Ht 65.0 in | Wt 132.6 lb

## 2018-08-19 DIAGNOSIS — Z85118 Personal history of other malignant neoplasm of bronchus and lung: Secondary | ICD-10-CM | POA: Diagnosis not present

## 2018-08-19 DIAGNOSIS — C3432 Malignant neoplasm of lower lobe, left bronchus or lung: Secondary | ICD-10-CM

## 2018-08-19 NOTE — Telephone Encounter (Signed)
Scheduled appt per 2/26 los - sent reminder letter in the mail with appt date and time

## 2018-08-19 NOTE — Progress Notes (Signed)
[facility] [phone]   [phone]  OFFICE PROGRESS NOTE  Brandi Melter, MD [address] 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  PRIOR THERAPY:  1) status post left lower lobectomy with lymph node dissection on September 30, 2017. 2) Adjuvant systemic chemotherapy with 4 cycles of cisplatin 80 mg/M2 on day 1 and etoposide 100 mg/M2 on days 1, 2 and 3 every 3 weeks.  First dose Nov 03, 2017.  Status post 4 cycles.  Last dose was given January 12, 2018.  CURRENT THERAPY: Observation.  INTERVAL HISTORY: Brandi Crosby 63 y.o. female returns to the clinic today for follow-up visit accompanied by her husband.  The patient is feeling fine today with no concerning complaints.  She denied having any chest pain, shortness of breath, cough or hemoptysis.  She denied having any nausea, vomiting, diarrhea or constipation.  She has no headache or visual changes.  She had repeat CT scan of the chest performed recently and she is here for evaluation and discussion of her scan results.   MEDICAL HISTORY: Past Medical History:  Diagnosis Date  . Allergy   . Anxiety   . Depression   . Hyperlipemia   . Lung mass   . Shingles     ALLERGIES:  is allergic to augmentin [amoxicillin-pot clavulanate] and penicillins.  MEDICATIONS:  Current Outpatient Medications  Medication Sig Dispense Refill  . ALPRAZolam (XANAX) 0.5 MG tablet Take 0.5 mg by mouth at bedtime as needed for anxiety or sleep.     . Ascorbic Acid (VITAMIN C) 1000 MG tablet Take 1,000 mg by mouth daily.      . cholecalciferol (VITAMIN D) 1000 units tablet Take 1,000 Units by mouth daily.    [name] escitalopram (LEXAPRO) 10 MG tablet Take 10 mg by mouth daily.     [name] ibuprofen (ADVIL,MOTRIN) 200 MG tablet Take 400 mg by mouth  every 6 (six) hours as needed for headache or moderate pain.     [name] prochlorperazine (COMPAZINE) 10 MG tablet Take 1 tablet (10 mg total) by mouth every 6 (six) hours as needed for nausea or vomiting. 30 tablet 0   No current facility-administered medications for this visit.     SURGICAL HISTORY:  Past Surgical History:  Procedure Laterality Date  . DENTAL SURGERY  2010   implant  . NODE DISSECTION Left 09/30/2017   Procedure: NODE DISSECTION;  Surgeon: Grace Isaac, MD;  Location: [facility];  Service: Thoracic;  Laterality: Left;  . RIGHT OOPHORECTOMY    . TONSILLECTOMY AND ADENOIDECTOMY    . VIDEO ASSISTED THORACOSCOPY (VATS)/WEDGE RESECTION Left 09/30/2017   Procedure: VIDEO ASSISTED THORACOSCOPY (VATS)/LUNG RESECTION;  Surgeon: Grace Isaac, MD;  Location: [facility];  Service: Thoracic;  Laterality: Left;  [name] VIDEO BRONCHOSCOPY N/A 09/30/2017   Procedure: VIDEO BRONCHOSCOPY;  Surgeon: Grace Isaac, MD;  Location: [facility] OR;  Service: Thoracic;  Laterality: N/A;  . WISDOM TOOTH EXTRACTION      REVIEW OF SYSTEMS:  A comprehensive review of systems was negative.   PHYSICAL EXAMINATION: General appearance: alert, cooperative and no distress Head: Normocephalic, without obvious abnormality, atraumatic Neck: no adenopathy, no JVD, supple, symmetrical, trachea midline and thyroid not enlarged, symmetric, no tenderness/mass/nodules Lymph nodes: Cervical, supraclavicular, and axillary nodes normal.  Resp: clear to auscultation bilaterally Back: symmetric, no curvature. ROM normal. No CVA tenderness. Cardio: regular rate and rhythm, S1, S2 normal, no murmur, click, rub or gallop GI: soft, non-tender; bowel sounds normal; no masses,  no organomegaly Extremities: extremities normal, atraumatic, no cyanosis or edema  ECOG PERFORMANCE STATUS: 0 - Asymptomatic  Blood pressure 130/86, pulse 67, temperature 98.1 F (36.7 C), temperature source Oral, resp. rate 18, height 5\' 5"  (1.651 m), weight  132 lb 9.6 oz (60.1 kg), SpO2 100 %.  LABORATORY DATA: Lab Results  Component Value Date   WBC 4.6 08/17/2018   HGB 12.9 08/17/2018   HCT 38.3 08/17/2018   MCV 95.0 08/17/2018   PLT 198 08/17/2018      Chemistry      Component Value Date/Time   NA 139 08/17/2018 1030   K 4.6 08/17/2018 1030   CL 102 08/17/2018 1030   CO2 26 08/17/2018 1030   BUN 17 08/17/2018 1030   CREATININE 1.05 (H) 08/17/2018 1030      Component Value Date/Time   CALCIUM 10.0 08/17/2018 1030   ALKPHOS 70 08/17/2018 1030   AST 22 08/17/2018 1030   ALT 18 08/17/2018 1030   BILITOT 0.6 08/17/2018 1030       RADIOGRAPHIC STUDIES: Ct Chest W Contrast  Result Date: 08/17/2018 CLINICAL DATA:  Limited stage left lower lobe small cell lung carcinoma status post left lower lobectomy 09/30/2017. Adjuvant chemotherapy completed. Interval observation. EXAM: CT CHEST WITH CONTRAST TECHNIQUE: Multidetector CT imaging of the chest was performed during intravenous contrast administration. CONTRAST:  76mL OMNIPAQUE IOHEXOL 300 MG/ML  SOLN COMPARISON:  05/18/2018 chest CT. FINDINGS: Cardiovascular: Normal heart size. No significant pericardial effusion/thickening. Atherosclerotic nonaneurysmal thoracic aorta. Normal caliber pulmonary arteries. No central pulmonary emboli. Mediastinum/Nodes: No discrete thyroid nodules. Unremarkable esophagus. No pathologically enlarged axillary, mediastinal or hilar lymph nodes. Lungs/Pleura: No pneumothorax. No pleural effusion. 2 clustered tiny posterior left upper lobe pulmonary nodules, largest 2 mm (series 7/image 40), both stable since 02/09/2018 chest CT, probably benign. No acute consolidative airspace disease, lung masses or new significant pulmonary nodules. Upper abdomen: No acute abnormality. Musculoskeletal: No aggressive appearing focal osseous lesions. Stable post thoracotomy changes in the posterior mid left ribs. Mild thoracic spondylosis. IMPRESSION: No evidence of recurrent or  metastatic disease in the chest. Aortic Atherosclerosis (ICD10-I70.0). Electronically Signed   By: Ilona Sorrel M.D.   On: 08/17/2018 14:10    ASSESSMENT AND PLAN: This is a very pleasant 63 years old white female recently diagnosed with limited stage small cell lung cancer status post surgical resection.   She completed a course of adjuvant treatment with systemic chemotherapy with cisplatin and etoposide status post 4 cycles.   The patient is currently on observation and she is feeling fine. She had repeat CT scan of the chest performed recently.  I personally and independently reviewed the scan and discussed the results with the patient today. Her scan showed no concerning findings for disease recurrence or progression. I recommended for the patient to continue on observation with repeat CT scan of the chest in 6 months. She was advised to call immediately if she has any concerning symptoms in the interval. The patient voices understanding of current disease status and treatment options and is in agreement with the current care plan.  All questions were answered. The patient knows to call the clinic with any problems, questions or concerns. We can certainly see the patient much sooner if necessary.  Disclaimer: This note was dictated  with voice recognition software. Similar sounding words can inadvertently be transcribed and may not be corrected upon review.

## 2018-09-03 ENCOUNTER — Other Ambulatory Visit: Payer: Self-pay

## 2018-09-03 ENCOUNTER — Ambulatory Visit
Admission: RE | Admit: 2018-09-03 | Discharge: 2018-09-03 | Disposition: A | Discharge status: Home / Self Care | Payer: 59 | Source: Ambulatory Visit | Attending: Family Medicine | Admitting: Family Medicine

## 2018-09-03 DIAGNOSIS — Z1231 Encounter for screening mammogram for malignant neoplasm of breast: Secondary | ICD-10-CM

## 2018-12-10 ENCOUNTER — Encounter: Payer: 59 | Admitting: Cardiothoracic Surgery

## 2018-12-14 ENCOUNTER — Other Ambulatory Visit: Payer: Self-pay | Admitting: *Deleted

## 2018-12-16 ENCOUNTER — Other Ambulatory Visit: Payer: Self-pay

## 2018-12-17 ENCOUNTER — Encounter: Payer: 59 | Admitting: Cardiothoracic Surgery

## 2018-12-24 ENCOUNTER — Other Ambulatory Visit: Payer: Self-pay

## 2018-12-24 ENCOUNTER — Ambulatory Visit: Payer: 59 | Admitting: Cardiothoracic Surgery

## 2018-12-24 VITALS — BP 160/88 | HR 73 | Temp 97.7°F | Resp 20 | Ht 65.0 in | Wt 135.0 lb

## 2018-12-24 DIAGNOSIS — Z902 Acquired absence of lung [part of]: Secondary | ICD-10-CM

## 2018-12-24 DIAGNOSIS — C3432 Malignant neoplasm of lower lobe, left bronchus or lung: Secondary | ICD-10-CM

## 2018-12-24 NOTE — Progress Notes (Signed)
[address]       [place] [address]             [phone]                  Jaree Dodd [place] Medical Record #[id number] Date of Birth: 1955/08/03  Referring ID:[id number], Annie Main, MD Primary Cardiology: Primary Care:Meyers, Annie Main, MD  Chief Complaint:  Follow Up Visit 09/30/2017 OPERATIVE REPORT PREOPERATIVE DIAGNOSIS:  Left lower lobe lung mass. POSTOPERATIVE DIAGNOSIS:  Left lower lobe lung malignancy, small cell carcinoma. PROCEDURE PERFORMED:  Video bronchoscopy with brushings, left video- assisted thoracoscopy, mini thoracotomy, left lower lobectomy with lymph node dissection and placement of On-Q device. SURGEON:  Lanelle Bal, MD   Patient now approximately 1 year following surgical resection of a small cell lung cancer and subsequent chemotherapy.  The patient notes she is doing well.  Has had some minor shortness of breath with exertion.  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    Limited stage (T2 a,N0, M0) of small cell lung cancer presented with left lower lobe lung mass status post left lower lobectomy with lymph node dissection on September 30, 2017.  PRIOR THERAPY:  1) status post left lower lobectomy with lymph node dissection on September 30, 2017. 2) Adjuvant systemic chemotherapy with 4 cycles of cisplatin 80 mg/M2 on day 1 and etoposide 100 mg/M2 on days 1, 2 and3every 3 weeks.  First dose Nov 03, 2017.  Status post 4 cycles.  Last dose was given January 12, 2018.  CURRENT THERAPY: Observation.    Zubrod Score: At the time of surgery this  patient's most appropriate activity status/level should be described as: [x]     0    Normal activity, no symptoms []     1    Restricted in physical strenuous activity but ambulatory, able to do out light work []     2    Ambulatory and capable of self care, unable to do work activities, up and about                 >50 % of waking hours                                                                                   []     3    Only limited self care, in bed greater than 50% of waking hours []     4    Completely disabled, no self care, confined to bed or chair []     5    Moribund  Social History   Tobacco Use  Smoking Status Former Smoker  . Packs/day: 0.25  . Years: 15.00  . Pack years: 3.75  . Types: Cigarettes  . Quit  date: 09/28/2017  . Years since quitting: 1.2  Smokeless Tobacco Never Used       Allergies  Allergen Reactions  . Augmentin [Amoxicillin-Pot Clavulanate] Diarrhea and Nausea And Vomiting  . Penicillins Nausea And Vomiting and Other (See Comments)    Has patient had a PCN reaction causing immediate rash, facial/tongue/throat swelling, SOB or lightheadedness with hypotension: Yes Has patient had a PCN reaction causing severe rash involving mucus membranes or skin necrosis: No Has patient had a PCN reaction that required hospitalization: No Has patient had a PCN reaction occurring within the last 10 years: Yes If all of the above answers are "NO", then may proceed with Cephalosporin use.     Current Outpatient Medications  Medication Sig Dispense Refill  . ALPRAZolam (XANAX) 0.5 MG tablet Take 0.5 mg by mouth at bedtime as needed for anxiety or sleep.     . Ascorbic Acid (VITAMIN C) 1000 MG tablet Take 1,000 mg by mouth daily.      . cholecalciferol (VITAMIN D) 1000 units tablet Take 1,000 Units by mouth daily.    [name] escitalopram (LEXAPRO) 10 MG tablet Take 10 mg by mouth daily.     [name] ibuprofen (ADVIL,MOTRIN) 200 MG tablet Take 400 mg by mouth every 6 (six) hours  as needed for headache or moderate pain.     [name] prochlorperazine (COMPAZINE) 10 MG tablet Take 1 tablet (10 mg total) by mouth every 6 (six) hours as needed for nausea or vomiting. (Patient not taking: Reported on 12/24/2018) 30 tablet 0   No current facility-administered medications for this visit.        Physical Exam: BP (!) 160/88   Pulse 73   Temp 97.7 F (36.5 C) (Skin)   Resp 20   Ht 5\' 5"  (1.651 m)   Wt 135 lb (61.2 kg)   SpO2 96%   BMI 22.47 kg/m  General appearance: alert and cooperative Head: Normocephalic, without obvious abnormality, atraumatic Neck: no adenopathy, no carotid bruit, no JVD, supple, symmetrical, trachea midline and thyroid not enlarged, symmetric, no tenderness/mass/nodules Lymph nodes: Cervical, supraclavicular, and axillary nodes normal. Resp: clear to auscultation bilaterally Cardio: regular rate and rhythm, S1, S2 normal, no murmur, click, rub or gallop Extremities: extremities normal, atraumatic, no cyanosis or edema and Homans sign is negative, no sign of DVT Neurologic: Grossly normal Left chest incisions and chest tube sites are well-healed  Diagnostic Studies & Laboratory data:         Recent Radiology Findings: CLINICAL DATA:  Limited stage left lower lobe small cell lung carcinoma status post left lower lobectomy 09/30/2017. Adjuvant chemotherapy completed. Interval observation.  EXAM: CT CHEST WITH CONTRAST  TECHNIQUE: Multidetector CT imaging of the chest was performed during intravenous contrast administration.  CONTRAST:  8mL OMNIPAQUE IOHEXOL 300 MG/ML  SOLN  COMPARISON:  05/18/2018 chest CT.  FINDINGS: Cardiovascular: Normal heart size. No significant pericardial effusion/thickening. Atherosclerotic nonaneurysmal thoracic aorta. Normal caliber pulmonary arteries. No central pulmonary emboli.  Mediastinum/Nodes: No discrete thyroid nodules. Unremarkable esophagus. No pathologically enlarged axillary, mediastinal or  hilar lymph nodes.  Lungs/Pleura: No pneumothorax. No pleural effusion. 2 clustered tiny posterior left upper lobe pulmonary nodules, largest 2 mm (series 7/image 40), both stable since 02/09/2018 chest CT, probably benign. No acute consolidative airspace disease, lung masses or new significant pulmonary nodules.  Upper abdomen: No acute abnormality.  Musculoskeletal: No aggressive appearing focal osseous lesions. Stable post thoracotomy changes in the posterior mid left ribs. Mild thoracic spondylosis.  IMPRESSION: No evidence of  recurrent or metastatic disease in the chest.  Aortic Atherosclerosis (ICD10-I70.0).   Electronically Signed   By: Ilona Sorrel M.D.   On: 08/17/2018 14:10     CLINICAL DATA:  Left lower lobe small cell lung cancer  EXAM: CT CHEST WITH CONTRAST  TECHNIQUE: Multidetector CT imaging of the chest was performed during intravenous contrast administration.  CONTRAST:  83mL OMNIPAQUE IOHEXOL 300 MG/ML  SOLN  COMPARISON:  02/09/2018  FINDINGS: Cardiovascular: The heart is normal in size. No pericardial effusion.  No evidence of thoracic aortic aneurysm. Mild atherosclerotic calcifications of the aortic arch.  Mediastinum/Nodes: No suspicious mediastinal lymphadenopathy. Small mediastinal lymph nodes, including a 6 mm short axis low right paratracheal node and a 6 mm short axis prevascular node (series 2/image 86), unchanged.  Visualized thyroid is unremarkable.  Lungs/Pleura: Status post left lower lobectomy.  3 mm subpleural nodule in the posterior left upper lobe (series 7/image 27), unchanged. Additional 3 mm subpleural nodule in the posterior left upper lobe (series 7/image 39), unchanged.  No focal consolidation.  No pleural effusion or pneumothorax.  Upper Abdomen: Visualized upper abdomen is unremarkable.  Musculoskeletal: Mild degenerative changes of the visualized thoracolumbar spine.  IMPRESSION:  Status post left lower lobectomy.  No findings suspicious for recurrent or metastatic disease.  Two 3 mm left upper lobe nodules are unchanged, likely benign.  Aortic Atherosclerosis (ICD10-I70.0).   Electronically Signed   By: Julian Hy M.D.   On: 05/19/2018 08:19     Recent Labs: Lab Results  Component Value Date   WBC 4.6 08/17/2018   HGB 12.9 08/17/2018   HCT 38.3 08/17/2018   PLT 198 08/17/2018   GLUCOSE 102 (H) 08/17/2018   ALT 18 08/17/2018   AST 22 08/17/2018   NA 139 08/17/2018   K 4.6 08/17/2018   CL 102 08/17/2018   CREATININE 1.05 (H) 08/17/2018   BUN 17 08/17/2018   CO2 26 08/17/2018   INR 0.98 09/25/2017      Assessment / Plan:    Status post left lower lobectomy for limited stage small cell carcinoma of the lung followed by chemotherapy completed July 2019  Patient stable from a surgical standpoint.  She will continue to be followed by Dr. Earlie Server.  I be glad to see her at her or his requested anytime    Grace Isaac 01/04/2019 10:24 AM

## 2019-02-08 ENCOUNTER — Other Ambulatory Visit: Payer: 59

## 2019-02-15 ENCOUNTER — Ambulatory Visit (HOSPITAL_COMMUNITY)
Admission: RE | Admit: 2019-02-15 | Discharge: 2019-02-15 | Disposition: A | Discharge status: Home / Self Care | Payer: 59 | Source: Ambulatory Visit | Attending: Internal Medicine | Admitting: Internal Medicine

## 2019-02-15 ENCOUNTER — Encounter (HOSPITAL_COMMUNITY): Payer: Self-pay

## 2019-02-15 ENCOUNTER — Other Ambulatory Visit: Payer: Self-pay

## 2019-02-15 ENCOUNTER — Inpatient Hospital Stay: Payer: 59 | Attending: Internal Medicine

## 2019-02-15 DIAGNOSIS — C3432 Malignant neoplasm of lower lobe, left bronchus or lung: Secondary | ICD-10-CM | POA: Diagnosis present

## 2019-02-15 DIAGNOSIS — R590 Localized enlarged lymph nodes: Secondary | ICD-10-CM | POA: Insufficient documentation

## 2019-02-15 DIAGNOSIS — Z85118 Personal history of other malignant neoplasm of bronchus and lung: Secondary | ICD-10-CM | POA: Diagnosis present

## 2019-02-15 HISTORY — DX: Malignant (primary) neoplasm, unspecified: C80.1

## 2019-02-15 LAB — CBC WITH DIFFERENTIAL (CANCER CENTER ONLY)
Abs Immature Granulocytes: 0.01 10*3/uL (ref 0.00–0.07)
Basophils Absolute: 0 10*3/uL (ref 0.0–0.1)
Basophils Relative: 1 %
Eosinophils Absolute: 0.2 10*3/uL (ref 0.0–0.5)
Eosinophils Relative: 3 %
HCT: 40.1 % (ref 36.0–46.0)
Hemoglobin: 13.6 g/dL (ref 12.0–15.0)
Immature Granulocytes: 0 %
Lymphocytes Relative: 34 %
Lymphs Abs: 2 10*3/uL (ref 0.7–4.0)
MCH: 32.9 pg (ref 26.0–34.0)
MCHC: 33.9 g/dL (ref 30.0–36.0)
MCV: 97.1 fL (ref 80.0–100.0)
Monocytes Absolute: 0.6 10*3/uL (ref 0.1–1.0)
Monocytes Relative: 11 %
Neutro Abs: 3 10*3/uL (ref 1.7–7.7)
Neutrophils Relative %: 51 %
Platelet Count: 224 10*3/uL (ref 150–400)
RBC: 4.13 MIL/uL (ref 3.87–5.11)
RDW: 12.4 % (ref 11.5–15.5)
WBC Count: 5.8 10*3/uL (ref 4.0–10.5)
nRBC: 0 % (ref 0.0–0.2)

## 2019-02-15 LAB — CMP (CANCER CENTER ONLY)
ALT: 20 U/L (ref 0–44)
AST: 22 U/L (ref 15–41)
Albumin: 4.4 g/dL (ref 3.5–5.0)
Alkaline Phosphatase: 81 U/L (ref 38–126)
Anion gap: 9 (ref 5–15)
BUN: 12 mg/dL (ref 8–23)
CO2: 26 mmol/L (ref 22–32)
Calcium: 9.6 mg/dL (ref 8.9–10.3)
Chloride: 105 mmol/L (ref 98–111)
Creatinine: 0.99 mg/dL (ref 0.44–1.00)
GFR, Est AFR Am: >60 mL/min (ref >60)
GFR, Est Non Af Am: >60 mL/min (ref >60)
Glucose, Bld: 93 mg/dL (ref 70–99)
Potassium: 4.4 mmol/L (ref 3.5–5.1)
Sodium: 140 mmol/L (ref 135–145)
Total Bilirubin: 0.5 mg/dL (ref 0.3–1.2)
Total Protein: 7.5 g/dL (ref 6.5–8.1)

## 2019-02-15 MED ORDER — IOHEXOL 300 MG/ML  SOLN
75.0000 mL | Freq: Once | INTRAMUSCULAR | Status: AC | PRN
  Administered 2019-02-15: 12:00:00 75 mL via INTRAVENOUS

## 2019-02-15 MED ORDER — SODIUM CHLORIDE (PF) 0.9 % IJ SOLN
INTRAMUSCULAR | Status: AC
  Filled 2019-02-15: qty 50

## 2019-02-17 ENCOUNTER — Other Ambulatory Visit: Payer: Self-pay

## 2019-02-17 ENCOUNTER — Inpatient Hospital Stay: Payer: 59 | Admitting: Internal Medicine

## 2019-02-17 VITALS — BP 138/79 | HR 72 | Temp 97.8°F | Resp 17 | Ht 65.0 in | Wt 135.1 lb

## 2019-02-17 DIAGNOSIS — C3432 Malignant neoplasm of lower lobe, left bronchus or lung: Secondary | ICD-10-CM | POA: Diagnosis not present

## 2019-02-17 DIAGNOSIS — Z85118 Personal history of other malignant neoplasm of bronchus and lung: Secondary | ICD-10-CM | POA: Diagnosis not present

## 2019-02-17 NOTE — Progress Notes (Signed)
[facility] [phone]   [phone]  OFFICE PROGRESS NOTE  [name], MD [address] [place] [address]  DIAGNOSIS: Limited stage (T2 a, N0, M0) of small cell lung cancer presented with left lower lobe lung mass status post left lower lobectomy with lymph node dissection on September 30, 2017.  PRIOR THERAPY:  1) status post left lower lobectomy with lymph node dissection on September 30, 2017. 2) Adjuvant systemic chemotherapy with 4 cycles of cisplatin 80 mg/M2 on day 1 and etoposide 100 mg/M2 on days 1, 2 and 3 every 3 weeks.  First dose Nov 03, 2017.  Status post 4 cycles.  Last dose was given January 12, 2018.  CURRENT THERAPY: Observation.  INTERVAL HISTORY: Brandi Crosby 63 y.o. female returns to the clinic today for follow-up visit.  The patient is feeling fine today with no concerning complaints.  She denied having any chest pain, shortness of breath, cough or hemoptysis.  She denied having any fever or chills.  She has no nausea, vomiting, diarrhea or constipation.  She has no headache or visual changes.  She has no weight loss or night sweats.  She had repeat CT scan of the chest performed recently and she is here for evaluation and discussion of her scan results.   MEDICAL HISTORY: Past Medical History:  Diagnosis Date   Allergy    Anxiety    Depression    Hyperlipemia    Lung mass    SCL ca dx'd 08/2017   Shingles     ALLERGIES:  is allergic to augmentin [amoxicillin-pot clavulanate] and penicillins.  MEDICATIONS:  Current Outpatient Medications  Medication Sig Dispense Refill   ALPRAZolam (XANAX) 0.5 MG tablet Take 0.5 mg by mouth at bedtime as needed for anxiety or sleep.      Ascorbic Acid (VITAMIN C) 1000 MG tablet Take 1,000 mg by mouth daily.       cholecalciferol (VITAMIN D) 1000 units tablet Take 1,000 Units by mouth daily.     escitalopram (LEXAPRO) 10 MG tablet Take 10 mg by mouth daily.       ibuprofen (ADVIL,MOTRIN) 200 MG tablet Take 400 mg by mouth every 6 (six) hours as needed for headache or moderate pain.      prochlorperazine (COMPAZINE) 10 MG tablet Take 1 tablet (10 mg total) by mouth every 6 (six) hours as needed for nausea or vomiting. (Patient not taking: Reported on 12/24/2018) 30 tablet 0   No current facility-administered medications for this visit.     SURGICAL HISTORY:  Past Surgical History:  Procedure Laterality Date   DENTAL SURGERY  2010   implant   NODE DISSECTION Left 09/30/2017   Procedure: NODE DISSECTION;  Surgeon: Grace Isaac, MD;  Location: [facility];  Service: Thoracic;  Laterality: Left;   RIGHT OOPHORECTOMY     TONSILLECTOMY AND ADENOIDECTOMY     VIDEO ASSISTED THORACOSCOPY (VATS)/WEDGE RESECTION Left 09/30/2017   Procedure: VIDEO ASSISTED THORACOSCOPY (VATS)/LUNG RESECTION;  Surgeon: Grace Isaac, MD;  Location: [facility];  Service: Thoracic;  Laterality: Left;   VIDEO BRONCHOSCOPY N/A 09/30/2017   Procedure: VIDEO BRONCHOSCOPY;  Surgeon: Grace Isaac, MD;  Location: [facility];  Service: Thoracic;  Laterality: N/A;   WISDOM TOOTH EXTRACTION      REVIEW OF SYSTEMS:  A comprehensive review of systems was negative.   PHYSICAL EXAMINATION: General appearance: alert, cooperative and no distress Head: Normocephalic, without obvious abnormality, atraumatic  Neck: no adenopathy, no JVD, supple, symmetrical, trachea midline and thyroid not enlarged, symmetric, no tenderness/mass/nodules Lymph nodes: Cervical, supraclavicular, and axillary nodes normal. Resp: clear to auscultation bilaterally Back: symmetric, no curvature. ROM normal. No CVA tenderness. Cardio: regular rate and rhythm, S1, S2 normal, no murmur, click, rub or gallop GI: soft, non-tender; bowel sounds normal; no masses,  no organomegaly Extremities: extremities normal, atraumatic, no cyanosis or edema  ECOG PERFORMANCE STATUS: 0 - Asymptomatic  Blood pressure 138/79,  pulse 72, temperature 97.8 F (36.6 C), temperature source Temporal, resp. rate 17, height 5\' 5"  (1.651 m), weight 135 lb 1.6 oz (61.3 kg), SpO2 99 %.  LABORATORY DATA: Lab Results  Component Value Date   WBC 5.8 02/15/2019   HGB 13.6 02/15/2019   HCT 40.1 02/15/2019   MCV 97.1 02/15/2019   PLT 224 02/15/2019      Chemistry      Component Value Date/Time   NA 140 02/15/2019 1024   K 4.4 02/15/2019 1024   CL 105 02/15/2019 1024   CO2 26 02/15/2019 1024   BUN 12 02/15/2019 1024   CREATININE 0.99 02/15/2019 1024      Component Value Date/Time   CALCIUM 9.6 02/15/2019 1024   ALKPHOS 81 02/15/2019 1024   AST 22 02/15/2019 1024   ALT 20 02/15/2019 1024   BILITOT 0.5 02/15/2019 1024       RADIOGRAPHIC STUDIES: Ct Chest W Contrast  Result Date: 02/15/2019 CLINICAL DATA:  Small cell lung cancer. EXAM: CT CHEST WITH CONTRAST TECHNIQUE: Multidetector CT imaging of the chest was performed during intravenous contrast administration. CONTRAST:  16mL OMNIPAQUE IOHEXOL 300 MG/ML  SOLN COMPARISON:  08/17/2018. FINDINGS: Cardiovascular: Atherosclerotic calcification of the aorta and coronary arteries. Heart size normal. No pericardial effusion. Mediastinum/Nodes: Mediastinal and hilar lymph nodes are not enlarged by CT size criteria. A deep high left axillary lymph node measures 11 mm (2/15), new. Other left axillary lymph nodes are subcentimeter in short axis size and stable. No right axillary adenopathy. Esophagus is grossly unremarkable. Lungs/Pleura: A few scattered peripheral millimetric pulmonary nodules are unchanged. Left lower lobectomy. Lungs are otherwise clear. No pleural fluid. Airway is unremarkable. Upper Abdomen: Subcentimeter low-attenuation lesion in the dome of the liver is too small to characterize. Visualized portions of the liver, adrenal glands, kidneys, spleen, pancreas, stomach and bowel are otherwise unremarkable. No upper abdominal adenopathy. Musculoskeletal: No  worrisome lytic or sclerotic lesions. IMPRESSION: 1. No definitive evidence of recurrent or metastatic disease. A new borderline enlarged high left axillary lymph node is nonspecific. Continued attention on follow-up exams is warranted. 2. Aortic atherosclerosis (ICD10-170.0). Coronary artery calcification. Electronically Signed   By: Lorin Picket M.D.   On: 02/15/2019 13:18    ASSESSMENT AND PLAN: This is a very pleasant 63 years old white female recently diagnosed with limited stage small cell lung cancer status post surgical resection.   She completed a course of adjuvant treatment with systemic chemotherapy with cisplatin and etoposide status post 4 cycles.   The patient is currently on observation and has been doing well with no concerning complaints. She had repeat CT scan of the chest performed recently.  I personally and independently reviewed the scan images and discussed the results with the patient. Her scan showed no concerning findings for disease recurrence but the patient has slightly enlarged with left axillary lymph node that probably unrelated to her history of lung cancer. I recommended for the patient to continue on observation with repeat CT scan of the  chest in 6 months but she was advised to call immediately if she notices any further enlargement or palpable mass in the axilla. The patient agreed to the current plan. She was advised to call immediately if she has any other concerning symptoms. The patient voices understanding of current disease status and treatment options and is in agreement with the current care plan.  All questions were answered. The patient knows to call the clinic with any problems, questions or concerns. We can certainly see the patient much sooner if necessary.  Disclaimer: This note was dictated with voice recognition software. Similar sounding words can inadvertently be transcribed and may not be corrected upon review.

## 2019-02-18 ENCOUNTER — Telehealth: Payer: Self-pay | Admitting: Internal Medicine

## 2019-02-18 NOTE — Telephone Encounter (Signed)
Scheduled appt per 8/26 los - mailed letter with appt date and time

## 2019-06-13 IMAGING — CT NM PET TUM IMG INITIAL (PI) SKULL BASE T - THIGH
1 of 8 series · 1 of 25 positions shown · non-contrast
Comparison: CT chest 08/26/2017

CLINICAL DATA: Initial treatment strategy for left lower lobe
pulmonary lesion..

EXAM:
NUCLEAR MEDICINE PET SKULL BASE TO THIGH
TECHNIQUE: 6.2 mCi F-18 FDG was injected intravenously. Full-ring PET imaging
was performed from the skull base to thigh after the radiotracer. CT
data was obtained and used for attenuation correction and anatomic
localization.
Fasting blood glucose: 99 mg/dl

[Series 4: ct sk_thigh 5.0 b31f · axial · 5.0mm · 0.98mm/px · 1 of 210 slices shown]
[im 210/210  brain]
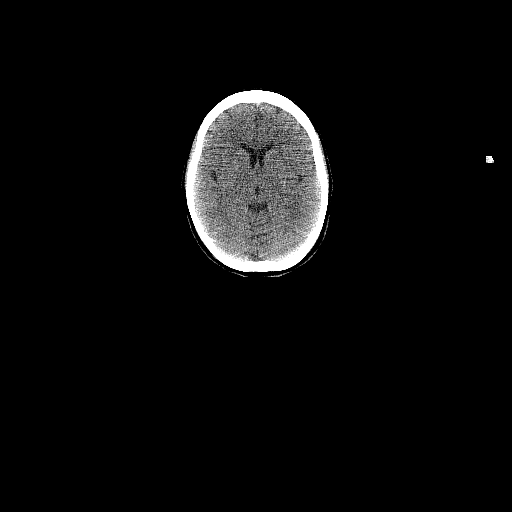

[1 of 25 positions shown; findings below may reference images not displayed]

FINDINGS: Mediastinal blood pool activity: SUV max

NECK: There is a 18.5 x 13.5 mm solid lesion in the posterior
inferior aspect of the right parotid gland which is hypermetabolic
with SUV max of 9.65. This is worrisome for a primary parotid gland
neoplasm. No associated neck adenopathy. Recommend ENT
consultation/evaluation and consideration for biopsy.

Incidental CT findings: none

CHEST: The 2.7 cm left lower lobe pulmonary lesion is hypermetabolic
with SUV max of 9.97 and consistent with primary lung neoplasm. No
enlarged or hypermetabolic mediastinal or hilar lymph nodes.

No other pulmonary lesions are identified. Stable underlying
emphysematous changes.

No breast masses, supraclavicular or axillary adenopathy. Small
scattered lymph nodes are noted.

Incidental CT findings: None

ABDOMEN/PELVIS: No abnormal hypermetabolic activity within the
liver, pancreas, adrenal glands, or spleen. No hypermetabolic lymph
nodes in the abdomen or pelvis.

Incidental CT findings: none

SKELETON: No focal hypermetabolic activity to suggest skeletal
metastasis.

Incidental CT findings: none
IMPRESSION: 1. 2.7 cm left lower lobe pulmonary lesion is hypermetabolic and
consistent with primary lung neoplasm. No mediastinal or hilar
adenopathy or evidence of metastatic disease.
2. Solid hypermetabolic right parotid gland lesion worrisome for
primary parotid gland neoplasm. Recommend ENT consultation.

## 2019-07-03 ENCOUNTER — Encounter (HOSPITAL_COMMUNITY): Payer: Self-pay

## 2019-07-03 ENCOUNTER — Emergency Department (HOSPITAL_COMMUNITY): Payer: 59

## 2019-07-03 ENCOUNTER — Other Ambulatory Visit: Payer: Self-pay

## 2019-07-03 ENCOUNTER — Inpatient Hospital Stay (HOSPITAL_COMMUNITY)
Admission: EM | Admit: 2019-07-03 | Discharge: 2019-07-06 | DRG: 488 | Disposition: A | Discharge status: Home Health | Payer: 59 | Attending: Student | Admitting: Student

## 2019-07-03 DIAGNOSIS — Y92000 Kitchen of unspecified non-institutional (private) residence as  the place of occurrence of the external cause: Secondary | ICD-10-CM

## 2019-07-03 DIAGNOSIS — F329 Major depressive disorder, single episode, unspecified: Secondary | ICD-10-CM | POA: Diagnosis present

## 2019-07-03 DIAGNOSIS — S82832A Other fracture of upper and lower end of left fibula, initial encounter for closed fracture: Secondary | ICD-10-CM | POA: Diagnosis present

## 2019-07-03 DIAGNOSIS — I493 Ventricular premature depolarization: Secondary | ICD-10-CM | POA: Diagnosis present

## 2019-07-03 DIAGNOSIS — T148XXA Other injury of unspecified body region, initial encounter: Secondary | ICD-10-CM

## 2019-07-03 DIAGNOSIS — S83282A Other tear of lateral meniscus, current injury, left knee, initial encounter: Secondary | ICD-10-CM

## 2019-07-03 DIAGNOSIS — Z79899 Other long term (current) drug therapy: Secondary | ICD-10-CM

## 2019-07-03 DIAGNOSIS — Z8349 Family history of other endocrine, nutritional and metabolic diseases: Secondary | ICD-10-CM

## 2019-07-03 DIAGNOSIS — Z880 Allergy status to penicillin: Secondary | ICD-10-CM

## 2019-07-03 DIAGNOSIS — Z20822 Contact with and (suspected) exposure to covid-19: Secondary | ICD-10-CM | POA: Diagnosis present

## 2019-07-03 DIAGNOSIS — S83262A Peripheral tear of lateral meniscus, current injury, left knee, initial encounter: Secondary | ICD-10-CM | POA: Diagnosis present

## 2019-07-03 DIAGNOSIS — Z8249 Family history of ischemic heart disease and other diseases of the circulatory system: Secondary | ICD-10-CM

## 2019-07-03 DIAGNOSIS — E785 Hyperlipidemia, unspecified: Secondary | ICD-10-CM | POA: Diagnosis present

## 2019-07-03 DIAGNOSIS — S82102A Unspecified fracture of upper end of left tibia, initial encounter for closed fracture: Secondary | ICD-10-CM

## 2019-07-03 DIAGNOSIS — S82252A Displaced comminuted fracture of shaft of left tibia, initial encounter for closed fracture: Secondary | ICD-10-CM | POA: Diagnosis present

## 2019-07-03 DIAGNOSIS — Z902 Acquired absence of lung [part of]: Secondary | ICD-10-CM

## 2019-07-03 DIAGNOSIS — Z419 Encounter for procedure for purposes other than remedying health state, unspecified: Secondary | ICD-10-CM

## 2019-07-03 DIAGNOSIS — S82142A Displaced bicondylar fracture of left tibia, initial encounter for closed fracture: Secondary | ICD-10-CM | POA: Diagnosis not present

## 2019-07-03 DIAGNOSIS — S82202A Unspecified fracture of shaft of left tibia, initial encounter for closed fracture: Secondary | ICD-10-CM

## 2019-07-03 DIAGNOSIS — Z9089 Acquired absence of other organs: Secondary | ICD-10-CM

## 2019-07-03 DIAGNOSIS — M79605 Pain in left leg: Secondary | ICD-10-CM | POA: Diagnosis not present

## 2019-07-03 DIAGNOSIS — W010XXA Fall on same level from slipping, tripping and stumbling without subsequent striking against object, initial encounter: Secondary | ICD-10-CM | POA: Diagnosis present

## 2019-07-03 DIAGNOSIS — Z820 Family history of epilepsy and other diseases of the nervous system: Secondary | ICD-10-CM

## 2019-07-03 DIAGNOSIS — Z9221 Personal history of antineoplastic chemotherapy: Secondary | ICD-10-CM

## 2019-07-03 DIAGNOSIS — Z90721 Acquired absence of ovaries, unilateral: Secondary | ICD-10-CM

## 2019-07-03 DIAGNOSIS — Z85118 Personal history of other malignant neoplasm of bronchus and lung: Secondary | ICD-10-CM

## 2019-07-03 DIAGNOSIS — Z808 Family history of malignant neoplasm of other organs or systems: Secondary | ICD-10-CM

## 2019-07-03 DIAGNOSIS — D62 Acute posthemorrhagic anemia: Secondary | ICD-10-CM | POA: Diagnosis not present

## 2019-07-03 DIAGNOSIS — Z87891 Personal history of nicotine dependence: Secondary | ICD-10-CM

## 2019-07-03 DIAGNOSIS — F419 Anxiety disorder, unspecified: Secondary | ICD-10-CM | POA: Diagnosis present

## 2019-07-03 LAB — CBC WITH DIFFERENTIAL/PLATELET
Abs Immature Granulocytes: 0.05 10*3/uL (ref 0.00–0.07)
Basophils Absolute: 0 10*3/uL (ref 0.0–0.1)
Basophils Relative: 0 %
Eosinophils Absolute: 0 10*3/uL (ref 0.0–0.5)
Eosinophils Relative: 0 %
HCT: 35.3 % — ABNORMAL LOW (ref 36.0–46.0)
Hemoglobin: 11.9 g/dL — ABNORMAL LOW (ref 12.0–15.0)
Immature Granulocytes: 0 %
Lymphocytes Relative: 14 %
Lymphs Abs: 1.6 10*3/uL (ref 0.7–4.0)
MCH: 33.3 pg (ref 26.0–34.0)
MCHC: 33.7 g/dL (ref 30.0–36.0)
MCV: 98.9 fL (ref 80.0–100.0)
Monocytes Absolute: 0.8 10*3/uL (ref 0.1–1.0)
Monocytes Relative: 7 %
Neutro Abs: 9.3 10*3/uL — ABNORMAL HIGH (ref 1.7–7.7)
Neutrophils Relative %: 79 %
Platelets: 209 10*3/uL (ref 150–400)
RBC: 3.57 MIL/uL — ABNORMAL LOW (ref 3.87–5.11)
RDW: 12.1 % (ref 11.5–15.5)
WBC: 11.8 10*3/uL — ABNORMAL HIGH (ref 4.0–10.5)
nRBC: 0 % (ref 0.0–0.2)

## 2019-07-03 LAB — BASIC METABOLIC PANEL
Anion gap: 12 (ref 5–15)
BUN: 15 mg/dL (ref 8–23)
CO2: 20 mmol/L — ABNORMAL LOW (ref 22–32)
Calcium: 8.4 mg/dL — ABNORMAL LOW (ref 8.9–10.3)
Chloride: 106 mmol/L (ref 98–111)
Creatinine, Ser: 0.88 mg/dL (ref 0.44–1.00)
GFR calc Af Amer: >60 mL/min (ref >60)
GFR calc non Af Amer: >60 mL/min (ref >60)
Glucose, Bld: 122 mg/dL — ABNORMAL HIGH (ref 70–99)
Potassium: 3.8 mmol/L (ref 3.5–5.1)
Sodium: 138 mmol/L (ref 135–145)

## 2019-07-03 MED ORDER — ONDANSETRON HCL 4 MG/2ML IJ SOLN
4.0000 mg | Freq: Once | INTRAMUSCULAR | Status: AC
  Administered 2019-07-03: 23:00:00 4 mg via INTRAVENOUS
  Filled 2019-07-03: qty 2

## 2019-07-03 MED ORDER — MORPHINE SULFATE (PF) 4 MG/ML IV SOLN
4.0000 mg | Freq: Once | INTRAVENOUS | Status: AC
  Administered 2019-07-03: 23:00:00 4 mg via INTRAVENOUS
  Filled 2019-07-03: qty 1

## 2019-07-03 NOTE — ED Triage Notes (Signed)
Pt arrived via [facility] from home in [place] mechanical fall and left lower leg pain possible broken bone. Per EMS A/OX4, family aware of visitation policy. Pts baseline is ambulatory and denies head neck or back pain and LOC.     20 G RAC 200 mcg Fentanyl 200 mg Ketamine 1000 NS bolus given in route

## 2019-07-03 NOTE — ED Notes (Signed)
Pt on room phone with husband. Pure wick in place and education provided

## 2019-07-03 NOTE — ED Provider Notes (Signed)
[facility] DEPT Provider Note   CSN: [id number] Arrival date & time: 07/03/19  2139     History Chief Complaint  Patient presents with   Fall    Left Leg    Brandi Crosby is a 64 y.o. female with a hx of anxiety, depression, lung cancer in remission presents to the Emergency Department complaining of acute, persistent left leg pain onset just prior to arrival.  Patient reports she was in the kitchen and wearing slippery slippers causing her to fall.  She reports she is sure she broke her left leg.  She is unable to ambulate.  Patient reports 10 out of 10 pain in the left lower leg.  Per EMS, patient was given 2 doses of fentanyl and ketamine.  She reports ongoing pain.  She denies anticoagulant usage.  She denies numbness or tingling.  She denies pain in her hip or ankle.  Movement and palpation make her symptoms worse.  Pain control did help the pain some.  Patient denies loss of consciousness, hitting her head, neck or back pain, hip pain.   The history is provided by the patient and medical records. No language interpreter was used.       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   Goals of care, counseling/discussion 10/11/2017   Encounter for smoking cessation counseling 10/11/2017   S/P lobectomy of lung 09/30/2017    Past Surgical History:  Procedure Laterality Date   DENTAL SURGERY  2010   implant   NODE DISSECTION Left 09/30/2017   Procedure: NODE DISSECTION;  Surgeon: Grace Isaac, MD;  Location: [facility];  Service: Thoracic;  Laterality: Left;   RIGHT OOPHORECTOMY     TONSILLECTOMY AND  ADENOIDECTOMY     VIDEO ASSISTED THORACOSCOPY (VATS)/WEDGE RESECTION Left 09/30/2017   Procedure: VIDEO ASSISTED THORACOSCOPY (VATS)/LUNG RESECTION;  Surgeon: Grace Isaac, MD;  Location: [facility];  Service: Thoracic;  Laterality: Left;   VIDEO BRONCHOSCOPY N/A 09/30/2017   Procedure: VIDEO BRONCHOSCOPY;  Surgeon: Grace Isaac, MD;  [facility]: [facility] OR;  Service: Thoracic;  Laterality: N/A;   WISDOM TOOTH EXTRACTION       OB History   No obstetric history on file.     Family History  Problem Relation Age of Onset   Melanoma Mother    Early death Mother    Alzheimer's disease Father    Hyperlipidemia Father    Heart disease Sister     Social History   Tobacco Use   Smoking status: Former Smoker    Packs/day: 0.25    Years: 15.00    Pack years: 3.75    Types: Cigarettes    Quit date: 09/28/2017    Years since quitting: 1.7   Smokeless tobacco: Never Used  Substance Use Topics   Alcohol use: Yes    Alcohol/week: 14.0 standard drinks    Types: 14 Glasses of wine per week   Drug use: No    Home Medications Prior to Admission medications   Medication Sig Start Date End Date Taking? Authorizing Provider  ALPRAZolam Duanne Moron) 0.5  MG tablet Take 0.5 mg by mouth at bedtime as needed for anxiety or sleep.  12/31/10  Yes [provider]  Ascorbic Acid (VITAMIN C) 1000 MG tablet Take 1,000 mg by mouth daily.     Yes [provider]  cholecalciferol (VITAMIN D) 1000 units tablet Take 1,000 Units by mouth daily.   Yes [provider]  escitalopram (LEXAPRO) 10 MG tablet Take 10 mg by mouth daily.  01/15/11  Yes [provider]  prochlorperazine (COMPAZINE) 10 MG tablet Take 1 tablet (10 mg total) by mouth every 6 (six) hours as needed for nausea or vomiting. Patient not taking: Reported on 12/24/2018 10/27/17   Curt Bears, MD    Allergies    Augmentin [amoxicillin-pot clavulanate] and Penicillins  Review of Systems   Review of  Systems  Constitutional: Negative for appetite change, diaphoresis, fatigue, fever and unexpected weight change.  HENT: Negative for mouth sores.   Eyes: Negative for visual disturbance.  Respiratory: Negative for cough, chest tightness, shortness of breath and wheezing.   Cardiovascular: Negative for chest pain.  Gastrointestinal: Negative for abdominal pain, constipation, diarrhea, nausea and vomiting.  Endocrine: Negative for polydipsia, polyphagia and polyuria.  Genitourinary: Negative for dysuria, frequency, hematuria and urgency.  Musculoskeletal: Positive for arthralgias, gait problem and joint swelling. Negative for back pain, neck pain and neck stiffness.  Skin: Negative for rash.  Allergic/Immunologic: Negative for immunocompromised state.  Neurological: Negative for syncope, light-headedness and headaches.  Hematological: Does not bruise/bleed easily.  Psychiatric/Behavioral: Negative for sleep disturbance. The patient is not nervous/anxious.     Physical Exam Updated Vital Signs BP (!) 103/52 (BP Location: Left Arm)    Pulse 76    Temp 98.4 F (36.9 C) (Oral)    Resp 11    SpO2 98%   Physical Exam Vitals and nursing note reviewed.  Constitutional:      General: She is not in acute distress.    Appearance: She is not diaphoretic.  HENT:     Head: Normocephalic.  Eyes:     General: No scleral icterus.    Conjunctiva/sclera: Conjunctivae normal.  Cardiovascular:     Rate and Rhythm: Normal rate and regular rhythm.     Pulses: Normal pulses.          Radial pulses are 2+ on the right side and 2+ on the left side.       Dorsalis pedis pulses are 2+ on the right side and 2+ on the left side.  Pulmonary:     Effort: No tachypnea, accessory muscle usage, prolonged expiration, respiratory distress or retractions.     Breath sounds: No stridor.     Comments: Equal chest rise. No increased work of breathing. Abdominal:     General: There is no distension.     Palpations:  Abdomen is soft.     Tenderness: There is no abdominal tenderness. There is no guarding or rebound.  Musculoskeletal:     Cervical back: Normal range of motion.     Right hip: No tenderness.     Left hip: No tenderness.     Right knee: Normal.     Left knee: Deformity and ecchymosis present.     Right lower leg: Normal.     Left lower leg: Swelling, deformity, tenderness and bony tenderness present.     Right ankle: Normal.     Left ankle: Normal.     Right foot: Normal.     Left foot: Normal.  Comments: Left lower leg with complete instability of the proximal tibia and fibula.  Significant swelling and ecchymosis of the left calf.  Compartment is full but not tense.  Skin:    General: Skin is warm and dry.     Capillary Refill: Capillary refill takes less than 2 seconds.  Neurological:     Mental Status: She is alert.     GCS: GCS eye subscore is 4. GCS verbal subscore is 5. GCS motor subscore is 6.     Comments: Speech is clear and goal oriented.  Psychiatric:        Mood and Affect: Mood normal.     ED Results / Procedures / Treatments   Labs (all labs ordered are listed, but only abnormal results are displayed) Labs Reviewed  CBC WITH DIFFERENTIAL/PLATELET - Abnormal; Notable for the following components:      Result Value   WBC 11.8 (*)    RBC 3.57 (*)    Hemoglobin 11.9 (*)    HCT 35.3 (*)    Neutro Abs 9.3 (*)    All other components within normal limits  BASIC METABOLIC PANEL - Abnormal; Notable for the following components:   CO2 20 (*)    Glucose, Bld 122 (*)    Calcium 8.4 (*)    All other components within normal limits  RESPIRATORY PANEL BY RT PCR (FLU A&B, COVID)  HIV ANTIBODY (ROUTINE TESTING W REFLEX)    EKG EKG Interpretation  Date/Time:  Sunday July 04 2019 01:47:13 EST Ventricular Rate:  75 PR Interval:    QRS Duration: 96 QT Interval:  403 QTC Calculation: 451 R Axis:   68 Text Interpretation: Sinus rhythm Ventricular premature  complex Anteroseptal infarct, age indeterminate Confirmed by Ripley Fraise [id number]) on 07/04/2019 2:00:20 AM    Radiology DG Tibia/Fibula Left  Result Date: 07/03/2019 CLINICAL DATA:  Pain EXAM: LEFT TIBIA AND FIBULA - 2 VIEW COMPARISON:  None. FINDINGS: There are acute displaced fractures involving the proximal tibia and fibula. There is a lateral tibial plateau fracture. There is surrounding soft tissue swelling. IMPRESSION: 1. Acute displaced fractures involving both the proximal tibia and fibula. 2. Acute lateral tibial plateau fracture. Electronically Signed   By: Constance Holster M.D.   On: 07/03/2019 23:22   CT Knee Left Wo Contrast  Result Date: 07/04/2019 CLINICAL DATA:  Pain after fall EXAM: CT OF THE left KNEE and tibia/fibula WITHOUT CONTRAST TECHNIQUE: Multidetector CT imaging of the left knee was performed according to the standard protocol. Multiplanar CT image reconstructions were also generated. COMPARISON:  None. FINDINGS: Bones/Joint/Cartilage There is a comminuted impacted proximal tibial fracture involving the lateral tibial plateau and tibial eminence. There is approximately 6 mm overlying articular depression at the lateral tibial plateau. Small fracture fragment seen on the lateral tibia at the tibia-fibular articulation. Fracture line does extend through the intra-articular medial tibial eminence without overlying articular depression. The fracture also extends through the mid tibial shaft posteriorly. There is a comminuted mildly displaced proximal fibular fracture with overlap of fracture fragments. A moderate knee joint effusion is present. A 9 mm osseous lesion seen in the anterior medial femoral condyle with internal stippled calcification, likely benign chondroid lesion. Ligaments Suboptimally assessed by CT. Muscles and Tendons There appears to be edema within the posterior muscular compartment. Patellar and quadriceps tendons are intact. Soft tissues Prepatellar  subcutaneous edema seen. There is also subcutaneous edema seen surrounding knee. IMPRESSION: Comminuted impacted proximal tibial fracture involving the lateral tibial plateau and  tibial eminence with 6 mm of overlying articular surface depression. Fracture line also extends through the anteromedial tibial eminence. Comminuted mildly displaced proximal fibular fracture. Moderate knee joint effusion Electronically Signed   By: Prudencio Pair M.D.   On: 07/04/2019 00:45   CT Tibia Fibula Left Wo Contrast  Result Date: 07/04/2019 CLINICAL DATA:  Pain after fall EXAM: CT OF THE left KNEE and tibia/fibula WITHOUT CONTRAST TECHNIQUE: Multidetector CT imaging of the left knee was performed according to the standard protocol. Multiplanar CT image reconstructions were also generated. COMPARISON:  None. FINDINGS: Bones/Joint/Cartilage There is a comminuted impacted proximal tibial fracture involving the lateral tibial plateau and tibial eminence. There is approximately 6 mm overlying articular depression at the lateral tibial plateau. Small fracture fragment seen on the lateral tibia at the tibia-fibular articulation. Fracture line does extend through the intra-articular medial tibial eminence without overlying articular depression. The fracture also extends through the mid tibial shaft posteriorly. There is a comminuted mildly displaced proximal fibular fracture with overlap of fracture fragments. A moderate knee joint effusion is present. A 9 mm osseous lesion seen in the anterior medial femoral condyle with internal stippled calcification, likely benign chondroid lesion. Ligaments Suboptimally assessed by CT. Muscles and Tendons There appears to be edema within the posterior muscular compartment. Patellar and quadriceps tendons are intact. Soft tissues Prepatellar subcutaneous edema seen. There is also subcutaneous edema seen surrounding knee. IMPRESSION: Comminuted impacted proximal tibial fracture involving the lateral  tibial plateau and tibial eminence with 6 mm of overlying articular surface depression. Fracture line also extends through the anteromedial tibial eminence. Comminuted mildly displaced proximal fibular fracture. Moderate knee joint effusion Electronically Signed   By: Prudencio Pair M.D.   On: 07/04/2019 00:45   DG Chest Port 1 View  Result Date: 07/03/2019 CLINICAL DATA:  Preop EXAM: PORTABLE CHEST 1 VIEW COMPARISON:  Nov 13, 2017 FINDINGS: The heart size and mediastinal contours are within normal limits. Both lungs are clear. The visualized skeletal structures are unremarkable. IMPRESSION: No active disease. Electronically Signed   By: Prudencio Pair M.D.   On: 07/03/2019 23:21   DG Knee Complete 4 Views Left  Result Date: 07/03/2019 CLINICAL DATA:  Fall, knee pain, instability EXAM: LEFT KNEE - COMPLETE 4+ VIEW COMPARISON:  None. FINDINGS: Comminuted fractures noted through the proximal left tibial and fibular meta diaphyses. Mild to moderate displacement. The tibial fracture extends into the lateral tibial plateau. Associated moderate joint effusion. IMPRESSION: Comminuted proximal tibia and fibular fractures as above. The tibial fracture involves the lateral tibial plateau. Electronically Signed   By: Rolm Baptise M.D.   On: 07/03/2019 23:20    Procedures .Splint Application  Date/Time: 07/04/2019 12:39 AM Performed by: Abigail Butts, PA-C Authorized by: Abigail Butts, PA-C   Consent:    Consent obtained:  Verbal   Consent given by:  Patient   Risks discussed:  Discoloration, numbness, pain and swelling   Alternatives discussed:  No treatment Pre-procedure details:    Sensation:  Normal   Skin color:  Flesh Procedure details:    Laterality:  Left   Location:  Knee   Knee:  L knee   Cast type:  Long leg   Splint type:  Knee immobilizer   Supplies:  Cotton padding and prefabricated splint Post-procedure details:    Pain:  Unchanged   Sensation:  Normal   Skin color:   Flesh   Patient tolerance of procedure:  Tolerated well, no immediate complications   (including critical care  time)  Medications Ordered in ED Medications  acetaminophen (TYLENOL) tablet 325-650 mg (has no administration in time range)  oxyCODONE (Oxy IR/ROXICODONE) immediate release tablet 5-10 mg (has no administration in time range)  oxyCODONE (Oxy IR/ROXICODONE) immediate release tablet 10-15 mg (has no administration in time range)  HYDROmorphone (DILAUDID) injection 0.5-1 mg (has no administration in time range)  acetaminophen (TYLENOL) tablet 1,000 mg (1,000 mg Oral Given 07/04/19 0148)  methocarbamol (ROBAXIN) tablet 500 mg (has no administration in time range)    Or  methocarbamol (ROBAXIN) 500 mg in dextrose 5 % 50 mL IVPB (has no administration in time range)  zolpidem (AMBIEN) tablet 5 mg (has no administration in time range)  diphenhydrAMINE (BENADRYL) 12.5 MG/5ML elixir 12.5-25 mg (has no administration in time range)  docusate sodium (COLACE) capsule 100 mg (has no administration in time range)  polyethylene glycol (MIRALAX / GLYCOLAX) packet 17 g (has no administration in time range)  bisacodyl (DULCOLAX) EC tablet 5 mg (has no administration in time range)  sodium phosphate (FLEET) 7-19 GM/118ML enema 1 enema (has no administration in time range)  ondansetron (ZOFRAN) tablet 4 mg (has no administration in time range)    Or  ondansetron (ZOFRAN) injection 4 mg (has no administration in time range)  morphine 4 MG/ML injection 4 mg (4 mg Intravenous Given 07/03/19 2320)  ondansetron (ZOFRAN) injection 4 mg (4 mg Intravenous Given 07/03/19 2319)  morphine 4 MG/ML injection 4 mg (4 mg Intravenous Given 07/04/19 0149)    ED Course  I have reviewed the triage vital signs and the nursing notes.  Pertinent labs & imaging results that were available during my care of the patient were reviewed by me and considered in my medical decision making (see chart for details).  Clinical  Course as of Jul 03 204  Sat Jul 03, 2019  2354 Last oral intake 3pm   [HM]    Clinical Course User Index [HM] Kiondra Caicedo, Gwenlyn Perking   MDM Rules/Calculators/A&P                      Patient presents with left leg pain after fall.  Initial exam suggest fracture of the tibia and fibula.  Compartments are very full and firm but not tense.  Do not believe the patient has compartment syndrome at this time.  Patient given multiple rounds of pain control.  X-ray shows proximal tibia and fibula fracture.  Will obtain CT scan and discussed with orthopedics.  Discussed with Dr. Tamera Punt.  Patient will need surgical repair.  Knee immobilizer and splint placed without complication.  CT scan does show significant tibial plateau fracture.  Patient admitted for pain control and operative repair.  The patient was discussed with and seen by Dr. Sedonia Small who agrees with the treatment plan.  Final Clinical Impression(s) / ED Diagnoses Final diagnoses:  Closed fracture of left tibial plateau, initial encounter  Closed fracture of proximal end of left tibia, unspecified fracture morphology, initial encounter  Closed fracture of proximal end of left fibula, unspecified fracture morphology, initial encounter    Rx / DC Orders ED Discharge Orders    None       Garth Diffley, Gwenlyn Perking 07/04/19 0206    Maudie Flakes, MD 07/04/19 [phone]

## 2019-07-04 ENCOUNTER — Encounter (HOSPITAL_COMMUNITY): Payer: Self-pay | Admitting: Orthopedic Surgery

## 2019-07-04 DIAGNOSIS — S82142A Displaced bicondylar fracture of left tibia, initial encounter for closed fracture: Secondary | ICD-10-CM | POA: Diagnosis present

## 2019-07-04 LAB — RESPIRATORY PANEL BY RT PCR (FLU A&B, COVID)
Influenza A by PCR: NEGATIVE
Influenza B by PCR: NEGATIVE
SARS Coronavirus 2 by RT PCR: NEGATIVE

## 2019-07-04 LAB — HIV ANTIBODY (ROUTINE TESTING W REFLEX): HIV Screen 4th Generation wRfx: NONREACTIVE

## 2019-07-04 MED ORDER — MORPHINE SULFATE (PF) 4 MG/ML IV SOLN
4.0000 mg | Freq: Once | INTRAVENOUS | Status: AC
  Administered 2019-07-04: 02:00:00 4 mg via INTRAVENOUS
  Filled 2019-07-04: qty 1

## 2019-07-04 MED ORDER — ZOLPIDEM TARTRATE 5 MG PO TABS: 5.0000 mg | ORAL_TABLET | Freq: Every evening | ORAL | Status: DC | PRN

## 2019-07-04 MED ORDER — ACETAMINOPHEN 500 MG PO TABS
1000.0000 mg | ORAL_TABLET | Freq: Four times a day (QID) | ORAL | Status: AC
  Administered 2019-07-04 (×3): 1000 mg via ORAL
  Filled 2019-07-04 (×3): qty 2

## 2019-07-04 MED ORDER — METHOCARBAMOL 1000 MG/10ML IJ SOLN
500.0000 mg | Freq: Four times a day (QID) | INTRAVENOUS | Status: DC | PRN
  Filled 2019-07-04: qty 5

## 2019-07-04 MED ORDER — ONDANSETRON HCL 4 MG PO TABS: 4.0000 mg | ORAL_TABLET | Freq: Four times a day (QID) | ORAL | Status: DC | PRN

## 2019-07-04 MED ORDER — ALPRAZOLAM 0.5 MG PO TABS
0.5000 mg | ORAL_TABLET | Freq: Every evening | ORAL | Status: DC | PRN
  Administered 2019-07-04 – 2019-07-05 (×2): 0.5 mg via ORAL
  Filled 2019-07-04 (×2): qty 1

## 2019-07-04 MED ORDER — DIPHENHYDRAMINE HCL 12.5 MG/5ML PO ELIX: 12.5000 mg | ORAL_SOLUTION | ORAL | Status: DC | PRN

## 2019-07-04 MED ORDER — FLEET ENEMA 7-19 GM/118ML RE ENEM: 1.0000 | ENEMA | Freq: Once | RECTAL | Status: DC | PRN

## 2019-07-04 MED ORDER — DOCUSATE SODIUM 100 MG PO CAPS
100.0000 mg | ORAL_CAPSULE | Freq: Two times a day (BID) | ORAL | Status: DC
  Administered 2019-07-04 – 2019-07-06 (×3): 100 mg via ORAL
  Filled 2019-07-04 (×3): qty 1

## 2019-07-04 MED ORDER — HYDROMORPHONE HCL 1 MG/ML IJ SOLN
0.5000 mg | INTRAMUSCULAR | Status: DC | PRN
  Filled 2019-07-04: qty 1

## 2019-07-04 MED ORDER — OXYCODONE HCL 5 MG PO TABS
5.0000 mg | ORAL_TABLET | ORAL | Status: DC | PRN
  Administered 2019-07-04: 5 mg via ORAL
  Administered 2019-07-05: 10 mg via ORAL
  Administered 2019-07-05 – 2019-07-06 (×2): 5 mg via ORAL
  Filled 2019-07-04 (×2): qty 1
  Filled 2019-07-04: qty 2
  Filled 2019-07-04: qty 1

## 2019-07-04 MED ORDER — METHOCARBAMOL 500 MG PO TABS
500.0000 mg | ORAL_TABLET | Freq: Four times a day (QID) | ORAL | Status: DC | PRN
  Administered 2019-07-06: 07:00:00 500 mg via ORAL
  Filled 2019-07-04: qty 1

## 2019-07-04 MED ORDER — ONDANSETRON HCL 4 MG/2ML IJ SOLN
4.0000 mg | Freq: Four times a day (QID) | INTRAMUSCULAR | Status: DC | PRN
  Administered 2019-07-04: 08:00:00 4 mg via INTRAVENOUS
  Filled 2019-07-04: qty 2

## 2019-07-04 MED ORDER — BISACODYL 5 MG PO TBEC: 5.0000 mg | DELAYED_RELEASE_TABLET | Freq: Every day | ORAL | Status: DC | PRN

## 2019-07-04 MED ORDER — OXYCODONE HCL 5 MG PO TABS
10.0000 mg | ORAL_TABLET | ORAL | Status: DC | PRN
  Administered 2019-07-06: 07:00:00 10 mg via ORAL
  Filled 2019-07-04: qty 2

## 2019-07-04 MED ORDER — ACETAMINOPHEN 325 MG PO TABS: 325.0000 mg | ORAL_TABLET | Freq: Four times a day (QID) | ORAL | Status: DC | PRN

## 2019-07-04 MED ORDER — POLYETHYLENE GLYCOL 3350 17 G PO PACK: 17.0000 g | PACK | Freq: Every day | ORAL | Status: DC | PRN

## 2019-07-04 NOTE — Progress Notes (Signed)
I was called about this patient with a low energy fall in the kitchen. Severe proximal tibial plateau fx on xr.  Per ER PA the leg is swollen but compartments are not tense. Will admit for observation/elevation and ensure no progression to compartment syndrome.  This will need surgical fixation by ortho trauma specialist.  Possible transfer to [facility] in am.  Ortho tech to place in soft dressing ace/knee immobilizer.  I will see and examine in a few hours in the am.

## 2019-07-04 NOTE — Plan of Care (Signed)

## 2019-07-04 NOTE — Progress Notes (Signed)
18:07 - Spoke to [employer] call service to notify MD that patient requested 0.5 mg of alprazolam for insomnia; Lorrin Mais had previously been ordered, but patient stated she did not want to take [place].

## 2019-07-04 NOTE — Progress Notes (Signed)
Orthopedic Tech Progress Note Patient Details:  Brandi Crosby 06-Feb-1956 [id number]  Ortho Devices Type of Ortho Device: Ace wrap, Knee Immobilizer, Other (comment) Ortho Device/Splint Location: lle. webril covered with ace wrap from foot to thigh placed under the knee immobilizer. all applied at drs request with drs assistance. Ortho Device/Splint Interventions: Ordered, Application, Adjustment   Post Interventions Patient Tolerated: Well Instructions Provided: Care of device, Adjustment of device   Karolee Stamps 07/04/2019, 1:24 AM

## 2019-07-04 NOTE — H&P (Signed)
Brandi Crosby is an 64 y.o. female.   Chief Complaint: Left knee injury HPI: 64 year old female fell on Monday unloading her dishwasher.  Concern of severe left knee pain.  Presented to the emergency department.  X-rays revealed comminuted proximal tibial and fibular fracture.  Denies other injuries with the fall.  Pain is better with compression and elevation.  She currently notes that the pain is well-controlled.    Past Medical History:  Diagnosis Date  . Allergy   . Anxiety   . Depression   . Hyperlipemia   . Lung mass   . SCL ca dx'd 08/2017  . Shingles     Past Surgical History:  Procedure Laterality Date  . DENTAL SURGERY  2010   implant  . NODE DISSECTION Left 09/30/2017   Procedure: NODE DISSECTION;  Surgeon: Grace Isaac, MD;  Location: [place];  Service: Thoracic;  Laterality: Left;  . RIGHT OOPHORECTOMY    . TONSILLECTOMY AND ADENOIDECTOMY    . VIDEO ASSISTED THORACOSCOPY (VATS)/WEDGE RESECTION Left 09/30/2017   Procedure: VIDEO ASSISTED THORACOSCOPY (VATS)/LUNG RESECTION;  Surgeon: Grace Isaac, MD;  Location: [place];  Service: Thoracic;  Laterality: Left;  [name] VIDEO BRONCHOSCOPY N/A 09/30/2017   Procedure: VIDEO BRONCHOSCOPY;  Surgeon: Grace Isaac, MD;  Location: [facility] OR;  Service: Thoracic;  Laterality: N/A;  . WISDOM TOOTH EXTRACTION      Family History  Problem Relation Age of Onset  . Melanoma Mother   . Early death Mother   . Alzheimer's disease Father   . Hyperlipidemia Father   . Heart disease Sister    Social History:  reports that she quit smoking about 21 months ago. Her smoking use included cigarettes. She has a 3.75 pack-year smoking history. She has never used smokeless tobacco. She reports current alcohol use of about 14.0 standard drinks of alcohol per week. She reports that she does not use drugs.  Allergies:  Allergies  Allergen Reactions  . Augmentin [Amoxicillin-Pot Clavulanate] Diarrhea and Nausea And Vomiting  . Penicillins  Nausea And Vomiting and Other (See Comments)    Has patient had a PCN reaction causing immediate rash, facial/tongue/throat swelling, SOB or lightheadedness with hypotension: Yes Has patient had a PCN reaction causing severe rash involving mucus membranes or skin necrosis: No Has patient had a PCN reaction that required hospitalization: No Has patient had a PCN reaction occurring within the last 10 years: Yes If all of the above answers are "NO", then may proceed with Cephalosporin use.     Medications Prior to Admission  Medication Sig Dispense Refill  . ALPRAZolam (XANAX) 0.5 MG tablet Take 0.5 mg by mouth at bedtime as needed for anxiety or sleep.     . Ascorbic Acid (VITAMIN C) 1000 MG tablet Take 1,000 mg by mouth daily.      . cholecalciferol (VITAMIN D) 1000 units tablet Take 1,000 Units by mouth daily.    [name] escitalopram (LEXAPRO) 10 MG tablet Take 10 mg by mouth daily.     . prochlorperazine (COMPAZINE) 10 MG tablet Take 1 tablet (10 mg total) by mouth every 6 (six) hours as needed for nausea or vomiting. (Patient not taking: Reported on 12/24/2018) 30 tablet 0    Results for orders placed or performed during the hospital encounter of 07/03/19 (from the past 48 hour(s))  CBC with Differential     Status: Abnormal   Collection Time: 07/03/19 11:27 PM  Result Value Ref Range   WBC 11.8 (H) 4.0 -  10.5 K/uL   RBC 3.57 (L) 3.87 - 5.11 MIL/uL   Hemoglobin 11.9 (L) 12.0 - 15.0 g/dL   HCT 35.3 (L) 36.0 - 46.0 %   MCV 98.9 80.0 - 100.0 fL   MCH 33.3 26.0 - 34.0 pg   MCHC 33.7 30.0 - 36.0 g/dL   RDW 12.1 11.5 - 15.5 %   Platelets 209 150 - 400 K/uL   nRBC 0.0 0.0 - 0.2 %   Neutrophils Relative % 79 %   Neutro Abs 9.3 (H) 1.7 - 7.7 K/uL   Lymphocytes Relative 14 %   Lymphs Abs 1.6 0.7 - 4.0 K/uL   Monocytes Relative 7 %   Monocytes Absolute 0.8 0.1 - 1.0 K/uL   Eosinophils Relative 0 %   Eosinophils Absolute 0.0 0.0 - 0.5 K/uL   Basophils Relative 0 %   Basophils Absolute 0.0 0.0  - 0.1 K/uL   Immature Granulocytes 0 %   Abs Immature Granulocytes 0.05 0.00 - 0.07 K/uL    Comment: Performed at [facility], [place] [address] [place], [place] [address]  Basic metabolic panel     Status: Abnormal   Collection Time: 07/03/19 11:27 PM  Result Value Ref Range   Sodium 138 135 - 145 mmol/L   Potassium 3.8 3.5 - 5.1 mmol/L   Chloride 106 98 - 111 mmol/L   CO2 20 (L) 22 - 32 mmol/L   Glucose, Bld 122 (H) 70 - 99 mg/dL   BUN 15 8 - 23 mg/dL   Creatinine, Ser 0.88 0.44 - 1.00 mg/dL   Calcium 8.4 (L) 8.9 - 10.3 mg/dL   GFR calc non Af Amer >60 >60 mL/min   GFR calc Af Amer >60 >60 mL/min   Anion gap 12 5 - 15    Comment: Performed at [facility], [place] [address] [place], [place] [address]  Respiratory Panel by RT PCR (Flu A&B, Covid) - Nasopharyngeal Swab     Status: None   Collection Time: 07/03/19 11:27 PM   Specimen: Nasopharyngeal Swab  Result Value Ref Range   SARS Coronavirus 2 by RT PCR NEGATIVE NEGATIVE    Comment: (NOTE) SARS-CoV-2 target nucleic acids are NOT DETECTED. The SARS-CoV-2 RNA is generally detectable in upper respiratoy specimens during the acute phase of infection. The lowest concentration of SARS-CoV-2 viral copies this assay can detect is 131 copies/mL. A negative result does not preclude SARS-Cov-2 infection and should not be used as the sole basis for treatment or other patient management decisions. A negative result may occur with  improper specimen collection/handling, submission of specimen other than nasopharyngeal swab, presence of viral mutation(s) within the areas targeted by this assay, and inadequate number of viral copies (<131 copies/mL). A negative result must be combined with clinical observations, patient history, and epidemiological information. The expected result is Negative. Fact Sheet for Patients:  PinkCheek.be Fact Sheet for Healthcare Providers:   GravelBags.it This test is not yet ap proved or cleared by the [place] FDA and  has been authorized for detection and/or diagnosis of SARS-CoV-2 by FDA under an Emergency Use Authorization (EUA). This EUA will remain  in effect (meaning this test can be used) for the duration of the COVID-19 declaration under Section 564(b)(1) of the Act, 21 U.S.C. section 360bbb-3(b)(1), unless the authorization is terminated or revoked sooner.    Influenza A by PCR NEGATIVE NEGATIVE   Influenza B by PCR NEGATIVE NEGATIVE    Comment: (NOTE) The Xpert Xpress SARS-CoV-2/FLU/RSV  assay is intended as an aid in  the diagnosis of influenza from Nasopharyngeal swab specimens and  should not be used as a sole basis for treatment. Nasal washings and  aspirates are unacceptable for Xpert Xpress SARS-CoV-2/FLU/RSV  testing. Fact Sheet for Patients: PinkCheek.be Fact Sheet for Healthcare Providers: GravelBags.it This test is not yet approved or cleared by the [place] FDA and  has been authorized for detection and/or diagnosis of SARS-CoV-2 by  FDA under an Emergency Use Authorization (EUA). This EUA will remain  in effect (meaning this test can be used) for the duration of the  Covid-19 declaration under Section 564(b)(1) of the Act, 21  U.S.C. section 360bbb-3(b)(1), unless the authorization is  terminated or revoked. Performed at [facility], [place] [address], [place], [place] [address]    DG Tibia/Fibula Left  Result Date: 07/03/2019 CLINICAL DATA:  Pain EXAM: LEFT TIBIA AND FIBULA - 2 VIEW COMPARISON:  None. FINDINGS: There are acute displaced fractures involving the proximal tibia and fibula. There is a lateral tibial plateau fracture. There is surrounding soft tissue swelling. IMPRESSION: 1. Acute displaced fractures involving both the proximal tibia and fibula. 2. Acute lateral tibial  plateau fracture. Electronically Signed   By: Constance Holster M.D.   On: 07/03/2019 23:22   CT Knee Left Wo Contrast  Result Date: 07/04/2019 CLINICAL DATA:  Pain after fall EXAM: CT OF THE left KNEE and tibia/fibula WITHOUT CONTRAST TECHNIQUE: Multidetector CT imaging of the left knee was performed according to the standard protocol. Multiplanar CT image reconstructions were also generated. COMPARISON:  None. FINDINGS: Bones/Joint/Cartilage There is a comminuted impacted proximal tibial fracture involving the lateral tibial plateau and tibial eminence. There is approximately 6 mm overlying articular depression at the lateral tibial plateau. Small fracture fragment seen on the lateral tibia at the tibia-fibular articulation. Fracture line does extend through the intra-articular medial tibial eminence without overlying articular depression. The fracture also extends through the mid tibial shaft posteriorly. There is a comminuted mildly displaced proximal fibular fracture with overlap of fracture fragments. A moderate knee joint effusion is present. A 9 mm osseous lesion seen in the anterior medial femoral condyle with internal stippled calcification, likely benign chondroid lesion. Ligaments Suboptimally assessed by CT. Muscles and Tendons There appears to be edema within the posterior muscular compartment. Patellar and quadriceps tendons are intact. Soft tissues Prepatellar subcutaneous edema seen. There is also subcutaneous edema seen surrounding knee. IMPRESSION: Comminuted impacted proximal tibial fracture involving the lateral tibial plateau and tibial eminence with 6 mm of overlying articular surface depression. Fracture line also extends through the anteromedial tibial eminence. Comminuted mildly displaced proximal fibular fracture. Moderate knee joint effusion Electronically Signed   By: Prudencio Pair M.D.   On: 07/04/2019 00:45   CT Tibia Fibula Left Wo Contrast  Result Date: 07/04/2019 CLINICAL  DATA:  Pain after fall EXAM: CT OF THE left KNEE and tibia/fibula WITHOUT CONTRAST TECHNIQUE: Multidetector CT imaging of the left knee was performed according to the standard protocol. Multiplanar CT image reconstructions were also generated. COMPARISON:  None. FINDINGS: Bones/Joint/Cartilage There is a comminuted impacted proximal tibial fracture involving the lateral tibial plateau and tibial eminence. There is approximately 6 mm overlying articular depression at the lateral tibial plateau. Small fracture fragment seen on the lateral tibia at the tibia-fibular articulation. Fracture line does extend through the intra-articular medial tibial eminence without overlying articular depression. The fracture also extends through the mid tibial shaft posteriorly. There is a comminuted mildly  displaced proximal fibular fracture with overlap of fracture fragments. A moderate knee joint effusion is present. A 9 mm osseous lesion seen in the anterior medial femoral condyle with internal stippled calcification, likely benign chondroid lesion. Ligaments Suboptimally assessed by CT. Muscles and Tendons There appears to be edema within the posterior muscular compartment. Patellar and quadriceps tendons are intact. Soft tissues Prepatellar subcutaneous edema seen. There is also subcutaneous edema seen surrounding knee. IMPRESSION: Comminuted impacted proximal tibial fracture involving the lateral tibial plateau and tibial eminence with 6 mm of overlying articular surface depression. Fracture line also extends through the anteromedial tibial eminence. Comminuted mildly displaced proximal fibular fracture. Moderate knee joint effusion Electronically Signed   By: Prudencio Pair M.D.   On: 07/04/2019 00:45   DG Chest Port 1 View  Result Date: 07/03/2019 CLINICAL DATA:  Preop EXAM: PORTABLE CHEST 1 VIEW COMPARISON:  Nov 13, 2017 FINDINGS: The heart size and mediastinal contours are within normal limits. Both lungs are clear. The  visualized skeletal structures are unremarkable. IMPRESSION: No active disease. Electronically Signed   By: Prudencio Pair M.D.   On: 07/03/2019 23:21   DG Knee Complete 4 Views Left  Result Date: 07/03/2019 CLINICAL DATA:  Fall, knee pain, instability EXAM: LEFT KNEE - COMPLETE 4+ VIEW COMPARISON:  None. FINDINGS: Comminuted fractures noted through the proximal left tibial and fibular meta diaphyses. Mild to moderate displacement. The tibial fracture extends into the lateral tibial plateau. Associated moderate joint effusion. IMPRESSION: Comminuted proximal tibia and fibular fractures as above. The tibial fracture involves the lateral tibial plateau. Electronically Signed   By: Rolm Baptise M.D.   On: 07/03/2019 23:20    Review of Systems  Blood pressure 131/75, pulse 77, temperature 99 F (37.2 C), temperature source Oral, resp. rate 16, SpO2 96 %. Physical Exam  Constitutional: She is oriented to person, place, and time. She appears well-developed and well-nourished.  HENT:  Head: Atraumatic.  Eyes: EOM are normal.  Cardiovascular: Intact distal pulses.  Respiratory: Effort normal.  Musculoskeletal:     Comments: Left lower extremity wrapped in an Ace bandage and knee immobilizer is intact.  The limb was elevated.  She is able to wiggle her toes without difficulty or pain and has normal sensation to light touch on dorsal and plantar aspect of the foot.  She has intact motor function to ankle dorsiflexion and plantarflexion and can wiggle her toes without difficulty.  She has no pain with passive stretch of the toes or ankle.  Her calf is swollen but not tense and minimally tender.  Neurological: She is alert and oriented to person, place, and time.  Skin: Skin is warm and dry.  Psychiatric: She has a normal mood and affect.     Assessment/Plan Left leg proximal tibial fracture with metaphyseal comminution and depressed lateral tibial plateau component  This is a complex injury that will  require surgical fixation.  Given the complexity of the injury she would do best under the care of specialist.  I spoke with Dr. Doreatha Martin who agrees to take over care of the care of this patient.  She will be transferred to [facility] today with anticipation of surgery on Monday.  She will continue strict elevation and compression.  There is no concern of compartment syndrome.  She will be n.p.o. after midnight.  Isabella Stalling, MD 07/04/2019, 9:08 AM

## 2019-07-04 NOTE — Plan of Care (Signed)

## 2019-07-05 ENCOUNTER — Encounter (HOSPITAL_COMMUNITY)
Admission: EM | Disposition: A | Discharge status: Home Health | Payer: Self-pay | Source: Home / Self Care | Attending: Orthopedic Surgery

## 2019-07-05 ENCOUNTER — Encounter (HOSPITAL_COMMUNITY): Payer: Self-pay | Admitting: Orthopedic Surgery

## 2019-07-05 ENCOUNTER — Inpatient Hospital Stay (HOSPITAL_COMMUNITY): Payer: 59

## 2019-07-05 ENCOUNTER — Observation Stay (HOSPITAL_COMMUNITY): Payer: 59

## 2019-07-05 ENCOUNTER — Observation Stay (HOSPITAL_COMMUNITY): Payer: 59 | Admitting: Certified Registered"

## 2019-07-05 DIAGNOSIS — Z902 Acquired absence of lung [part of]: Secondary | ICD-10-CM | POA: Diagnosis not present

## 2019-07-05 DIAGNOSIS — Y92000 Kitchen of unspecified non-institutional (private) residence as  the place of occurrence of the external cause: Secondary | ICD-10-CM | POA: Diagnosis not present

## 2019-07-05 DIAGNOSIS — S82252A Displaced comminuted fracture of shaft of left tibia, initial encounter for closed fracture: Secondary | ICD-10-CM | POA: Diagnosis present

## 2019-07-05 DIAGNOSIS — Z20822 Contact with and (suspected) exposure to covid-19: Secondary | ICD-10-CM | POA: Diagnosis present

## 2019-07-05 DIAGNOSIS — S82202A Unspecified fracture of shaft of left tibia, initial encounter for closed fracture: Secondary | ICD-10-CM

## 2019-07-05 DIAGNOSIS — D62 Acute posthemorrhagic anemia: Secondary | ICD-10-CM | POA: Diagnosis not present

## 2019-07-05 DIAGNOSIS — Z87891 Personal history of nicotine dependence: Secondary | ICD-10-CM | POA: Diagnosis not present

## 2019-07-05 DIAGNOSIS — Z85118 Personal history of other malignant neoplasm of bronchus and lung: Secondary | ICD-10-CM | POA: Diagnosis not present

## 2019-07-05 DIAGNOSIS — I493 Ventricular premature depolarization: Secondary | ICD-10-CM | POA: Diagnosis present

## 2019-07-05 DIAGNOSIS — Z880 Allergy status to penicillin: Secondary | ICD-10-CM | POA: Diagnosis not present

## 2019-07-05 DIAGNOSIS — Z79899 Other long term (current) drug therapy: Secondary | ICD-10-CM | POA: Diagnosis not present

## 2019-07-05 DIAGNOSIS — F329 Major depressive disorder, single episode, unspecified: Secondary | ICD-10-CM | POA: Diagnosis present

## 2019-07-05 DIAGNOSIS — S83262A Peripheral tear of lateral meniscus, current injury, left knee, initial encounter: Secondary | ICD-10-CM | POA: Diagnosis present

## 2019-07-05 DIAGNOSIS — Z9221 Personal history of antineoplastic chemotherapy: Secondary | ICD-10-CM | POA: Diagnosis not present

## 2019-07-05 DIAGNOSIS — M79605 Pain in left leg: Secondary | ICD-10-CM | POA: Diagnosis present

## 2019-07-05 DIAGNOSIS — E785 Hyperlipidemia, unspecified: Secondary | ICD-10-CM | POA: Diagnosis present

## 2019-07-05 DIAGNOSIS — F419 Anxiety disorder, unspecified: Secondary | ICD-10-CM | POA: Diagnosis present

## 2019-07-05 DIAGNOSIS — Z808 Family history of malignant neoplasm of other organs or systems: Secondary | ICD-10-CM | POA: Diagnosis not present

## 2019-07-05 DIAGNOSIS — S83282A Other tear of lateral meniscus, current injury, left knee, initial encounter: Secondary | ICD-10-CM

## 2019-07-05 DIAGNOSIS — S82142A Displaced bicondylar fracture of left tibia, initial encounter for closed fracture: Secondary | ICD-10-CM | POA: Diagnosis present

## 2019-07-05 DIAGNOSIS — Z8349 Family history of other endocrine, nutritional and metabolic diseases: Secondary | ICD-10-CM | POA: Diagnosis not present

## 2019-07-05 DIAGNOSIS — Z90721 Acquired absence of ovaries, unilateral: Secondary | ICD-10-CM | POA: Diagnosis not present

## 2019-07-05 DIAGNOSIS — S82832A Other fracture of upper and lower end of left fibula, initial encounter for closed fracture: Secondary | ICD-10-CM | POA: Diagnosis present

## 2019-07-05 DIAGNOSIS — W010XXA Fall on same level from slipping, tripping and stumbling without subsequent striking against object, initial encounter: Secondary | ICD-10-CM | POA: Diagnosis present

## 2019-07-05 DIAGNOSIS — Z820 Family history of epilepsy and other diseases of the nervous system: Secondary | ICD-10-CM | POA: Diagnosis not present

## 2019-07-05 DIAGNOSIS — Z9089 Acquired absence of other organs: Secondary | ICD-10-CM | POA: Diagnosis not present

## 2019-07-05 DIAGNOSIS — Z8249 Family history of ischemic heart disease and other diseases of the circulatory system: Secondary | ICD-10-CM | POA: Diagnosis not present

## 2019-07-05 HISTORY — PX: ORIF TIBIA PLATEAU: SHX2132

## 2019-07-05 LAB — MRSA PCR SCREENING: MRSA by PCR: NEGATIVE

## 2019-07-05 SURGERY — OPEN REDUCTION INTERNAL FIXATION (ORIF) TIBIAL PLATEAU
Anesthesia: General | Laterality: Left

## 2019-07-05 MED ORDER — LACTATED RINGERS IV SOLN: INTRAVENOUS | Status: DC | PRN

## 2019-07-05 MED ORDER — ONDANSETRON HCL 4 MG/2ML IJ SOLN
INTRAMUSCULAR | Status: DC | PRN
  Administered 2019-07-05: 4 mg via INTRAVENOUS

## 2019-07-05 MED ORDER — 0.9 % SODIUM CHLORIDE (POUR BTL) OPTIME
TOPICAL | Status: DC | PRN
  Administered 2019-07-05: 1000 mL

## 2019-07-05 MED ORDER — ACETAMINOPHEN 325 MG PO TABS
650.0000 mg | ORAL_TABLET | Freq: Four times a day (QID) | ORAL | Status: DC
  Administered 2019-07-05 – 2019-07-06 (×5): 650 mg via ORAL
  Filled 2019-07-05 (×5): qty 2

## 2019-07-05 MED ORDER — MIDAZOLAM HCL 2 MG/2ML IJ SOLN
INTRAMUSCULAR | Status: AC
  Filled 2019-07-05: qty 2

## 2019-07-05 MED ORDER — VANCOMYCIN HCL 1000 MG IV SOLR
INTRAVENOUS | Status: DC | PRN
  Administered 2019-07-05: 1000 mg

## 2019-07-05 MED ORDER — ROCURONIUM BROMIDE 10 MG/ML (PF) SYRINGE
PREFILLED_SYRINGE | INTRAVENOUS | Status: AC
  Filled 2019-07-05: qty 10

## 2019-07-05 MED ORDER — FENTANYL CITRATE (PF) 250 MCG/5ML IJ SOLN
INTRAMUSCULAR | Status: AC
  Filled 2019-07-05: qty 5

## 2019-07-05 MED ORDER — CEFAZOLIN SODIUM-DEXTROSE 2-4 GM/100ML-% IV SOLN
2.0000 g | Freq: Three times a day (TID) | INTRAVENOUS | Status: AC
  Administered 2019-07-05 – 2019-07-06 (×2): 2 g via INTRAVENOUS
  Filled 2019-07-05 (×3): qty 100

## 2019-07-05 MED ORDER — LIDOCAINE 2% (20 MG/ML) 5 ML SYRINGE
INTRAMUSCULAR | Status: AC
  Filled 2019-07-05: qty 5

## 2019-07-05 MED ORDER — FENTANYL CITRATE (PF) 100 MCG/2ML IJ SOLN
INTRAMUSCULAR | Status: AC
  Filled 2019-07-05: qty 2

## 2019-07-05 MED ORDER — OXYCODONE HCL 5 MG PO TABS: 5.0000 mg | ORAL_TABLET | Freq: Once | ORAL | Status: DC | PRN

## 2019-07-05 MED ORDER — DEXAMETHASONE SODIUM PHOSPHATE 10 MG/ML IJ SOLN
INTRAMUSCULAR | Status: AC
  Filled 2019-07-05: qty 1

## 2019-07-05 MED ORDER — GABAPENTIN 100 MG PO CAPS
100.0000 mg | ORAL_CAPSULE | Freq: Three times a day (TID) | ORAL | Status: DC
  Administered 2019-07-05 – 2019-07-06 (×4): 100 mg via ORAL
  Filled 2019-07-05 (×4): qty 1

## 2019-07-05 MED ORDER — ROCURONIUM BROMIDE 10 MG/ML (PF) SYRINGE
PREFILLED_SYRINGE | INTRAVENOUS | Status: DC | PRN
  Administered 2019-07-05: 60 mg via INTRAVENOUS

## 2019-07-05 MED ORDER — FENTANYL CITRATE (PF) 100 MCG/2ML IJ SOLN
INTRAMUSCULAR | Status: DC | PRN
  Administered 2019-07-05: 100 ug via INTRAVENOUS
  Administered 2019-07-05: 50 ug via INTRAVENOUS
  Administered 2019-07-05: 100 ug via INTRAVENOUS
  Administered 2019-07-05 (×2): 50 ug via INTRAVENOUS

## 2019-07-05 MED ORDER — ONDANSETRON HCL 4 MG/2ML IJ SOLN
INTRAMUSCULAR | Status: AC
  Filled 2019-07-05: qty 2

## 2019-07-05 MED ORDER — CEFAZOLIN SODIUM-DEXTROSE 2-4 GM/100ML-% IV SOLN
INTRAVENOUS | Status: AC
  Administered 2019-07-05: 2000 mg via INTRAVENOUS
  Filled 2019-07-05: qty 100

## 2019-07-05 MED ORDER — MIDAZOLAM HCL 5 MG/5ML IJ SOLN
INTRAMUSCULAR | Status: DC | PRN
  Administered 2019-07-05: 2 mg via INTRAVENOUS

## 2019-07-05 MED ORDER — LIDOCAINE 2% (20 MG/ML) 5 ML SYRINGE
INTRAMUSCULAR | Status: DC | PRN
  Administered 2019-07-05: 30 mg via INTRAVENOUS

## 2019-07-05 MED ORDER — CEFAZOLIN SODIUM-DEXTROSE 2-3 GM-%(50ML) IV SOLR
INTRAVENOUS | Status: DC | PRN
  Administered 2019-07-05: 2 g via INTRAVENOUS

## 2019-07-05 MED ORDER — OXYCODONE HCL 5 MG/5ML PO SOLN: 5.0000 mg | Freq: Once | ORAL | Status: DC | PRN

## 2019-07-05 MED ORDER — ENOXAPARIN SODIUM 40 MG/0.4ML ~~LOC~~ SOLN: 40.0000 mg | SUBCUTANEOUS | Status: DC

## 2019-07-05 MED ORDER — PROPOFOL 10 MG/ML IV BOLUS
INTRAVENOUS | Status: DC | PRN
  Administered 2019-07-05: 20 mg via INTRAVENOUS
  Administered 2019-07-05: 135 mg via INTRAVENOUS

## 2019-07-05 MED ORDER — VANCOMYCIN HCL 1000 MG IV SOLR
INTRAVENOUS | Status: AC
  Filled 2019-07-05: qty 1000

## 2019-07-05 MED ORDER — DEXAMETHASONE SODIUM PHOSPHATE 10 MG/ML IJ SOLN
INTRAMUSCULAR | Status: DC | PRN
  Administered 2019-07-05: 4 mg via INTRAVENOUS

## 2019-07-05 MED ORDER — ONDANSETRON HCL 4 MG/2ML IJ SOLN: 4.0000 mg | Freq: Once | INTRAMUSCULAR | Status: DC | PRN

## 2019-07-05 MED ORDER — HYDROMORPHONE HCL 1 MG/ML IJ SOLN: 1.0000 mg | INTRAMUSCULAR | Status: DC | PRN

## 2019-07-05 MED ORDER — FENTANYL CITRATE (PF) 100 MCG/2ML IJ SOLN
25.0000 ug | INTRAMUSCULAR | Status: DC | PRN
  Administered 2019-07-05: 25 ug via INTRAVENOUS

## 2019-07-05 MED ORDER — PROPOFOL 10 MG/ML IV BOLUS
INTRAVENOUS | Status: AC
  Filled 2019-07-05: qty 20

## 2019-07-05 SURGICAL SUPPLY — 72 items
APL PRP STRL LF DISP 70% ISPRP (MISCELLANEOUS) ×2
BANDAGE ESMARK 6X9 LF (GAUZE/BANDAGES/DRESSINGS) ×1 IMPLANT
BIT DRILL CALIB QC 170X80 (BIT) ×3 IMPLANT
BIT DRILL QC SFS 2.5X170 (BIT) ×3 IMPLANT
BLADE CLIPPER SURG (BLADE) IMPLANT
BLADE SURG 15 STRL LF DISP TIS (BLADE) ×1 IMPLANT
BLADE SURG 15 STRL SS (BLADE) ×3
BNDG CMPR 9X6 STRL LF SNTH (GAUZE/BANDAGES/DRESSINGS) ×1
BNDG ELASTIC 6X5.8 VLCR STR LF (GAUZE/BANDAGES/DRESSINGS) ×3 IMPLANT
BNDG ESMARK 6X9 LF (GAUZE/BANDAGES/DRESSINGS) ×3
BNDG GAUZE ELAST 4 BULKY (GAUZE/BANDAGES/DRESSINGS) ×3 IMPLANT
BONE CANC CHIPS 20CC PCAN1/4 (Bone Implant) ×3 IMPLANT
BRUSH SCRUB EZ PLAIN DRY (MISCELLANEOUS) ×6 IMPLANT
CANISTER SUCT 3000ML PPV (MISCELLANEOUS) ×3 IMPLANT
CHIPS CANC BONE 20CC PCAN1/4 (Bone Implant) ×1 IMPLANT
CHLORAPREP W/TINT 26 (MISCELLANEOUS) ×6 IMPLANT
CLOSURE STERI-STRIP 1/2X4 (GAUZE/BANDAGES/DRESSINGS) ×2
CLSR STERI-STRIP ANTIMIC 1/2X4 (GAUZE/BANDAGES/DRESSINGS) ×4 IMPLANT
COVER SURGICAL LIGHT HANDLE (MISCELLANEOUS) ×3 IMPLANT
CUFF TOURN SGL QUICK 34 (TOURNIQUET CUFF) ×3
CUFF TRNQT CYL 34X4.125X (TOURNIQUET CUFF) ×1 IMPLANT
DRAPE C-ARM 42X72 X-RAY (DRAPES) ×3 IMPLANT
DRAPE C-ARMOR (DRAPES) ×3 IMPLANT
DRAPE ORTHO SPLIT 77X108 STRL (DRAPES) ×6
DRAPE SURG ORHT 6 SPLT 77X108 (DRAPES) ×2 IMPLANT
DRAPE U-SHAPE 47X51 STRL (DRAPES) ×3 IMPLANT
DRSG PAD ABDOMINAL 8X10 ST (GAUZE/BANDAGES/DRESSINGS) ×6 IMPLANT
ELECT REM PT RETURN 9FT ADLT (ELECTROSURGICAL) ×3
ELECTRODE REM PT RTRN 9FT ADLT (ELECTROSURGICAL) ×1 IMPLANT
GLOVE BIO SURGEON STRL SZ 6.5 (GLOVE) ×6 IMPLANT
GLOVE BIO SURGEON STRL SZ7.5 (GLOVE) ×12 IMPLANT
GLOVE BIO SURGEONS STRL SZ 6.5 (GLOVE) ×3
GLOVE BIOGEL PI IND STRL 6.5 (GLOVE) ×1 IMPLANT
GLOVE BIOGEL PI IND STRL 7.5 (GLOVE) ×1 IMPLANT
GLOVE BIOGEL PI INDICATOR 6.5 (GLOVE) ×2
GLOVE BIOGEL PI INDICATOR 7.5 (GLOVE) ×2
GOWN STRL REUS W/ TWL LRG LVL3 (GOWN DISPOSABLE) ×2 IMPLANT
GOWN STRL REUS W/TWL LRG LVL3 (GOWN DISPOSABLE) ×6
KIT BASIN OR (CUSTOM PROCEDURE TRAY) ×3 IMPLANT
KIT TURNOVER KIT B (KITS) ×3 IMPLANT
NDL SUT 6 .5 CRC .975X.05 MAYO (NEEDLE) ×1 IMPLANT
NEEDLE MAYO TAPER (NEEDLE) ×3
NS IRRIG 1000ML POUR BTL (IV SOLUTION) ×3 IMPLANT
PACK TOTAL JOINT (CUSTOM PROCEDURE TRAY) ×3 IMPLANT
PAD ARMBOARD 7.5X6 YLW CONV (MISCELLANEOUS) ×6 IMPLANT
PADDING CAST SYN 6 (CAST SUPPLIES) ×2
PADDING CAST SYNTHETIC 4 (CAST SUPPLIES) ×2
PADDING CAST SYNTHETIC 4X4 STR (CAST SUPPLIES) ×1 IMPLANT
PADDING CAST SYNTHETIC 6X4 NS (CAST SUPPLIES) ×1 IMPLANT
PLATE VA LCP PROX TIBIA 12H SM (Plate) ×3 IMPLANT
SCREW CORTEX 3.5 26MM (Screw) ×2 IMPLANT
SCREW CORTEX 3.5 28MM (Screw) ×6 IMPLANT
SCREW HEADED ST 3.5X75 (Screw) ×3 IMPLANT
SCREW LOCK CORT ST 3.5X26 (Screw) ×1 IMPLANT
SCREW LOCK CORT ST 3.5X28 (Screw) ×3 IMPLANT
SCREW LOCKING 3.5X70MM VA (Screw) ×9 IMPLANT
SUCTION FRAZIER HANDLE 10FR (MISCELLANEOUS) ×2
SUCTION TUBE FRAZIER 10FR DISP (MISCELLANEOUS) ×1 IMPLANT
SUT ETHILON 2 0 FS 18 (SUTURE) ×3 IMPLANT
SUT ETHILON 3 0 PS 1 (SUTURE) IMPLANT
SUT FIBERWIRE #2 38 T-5 BLUE (SUTURE)
SUT MNCRL AB 3-0 PS2 27 (SUTURE) ×6 IMPLANT
SUT VIC AB 0 CT1 27 (SUTURE)
SUT VIC AB 0 CT1 27XBRD ANBCTR (SUTURE) IMPLANT
SUT VIC AB 1 CT1 18XCR BRD 8 (SUTURE) IMPLANT
SUT VIC AB 1 CT1 27 (SUTURE) ×3
SUT VIC AB 1 CT1 27XBRD ANBCTR (SUTURE) ×1 IMPLANT
SUT VIC AB 1 CT1 8-18 (SUTURE)
SUT VIC AB 2-0 CT1 27 (SUTURE) ×6
SUT VIC AB 2-0 CT1 TAPERPNT 27 (SUTURE) ×2 IMPLANT
SUTURE FIBERWR #2 38 T-5 BLUE (SUTURE) IMPLANT
TOWEL GREEN STERILE (TOWEL DISPOSABLE) ×6 IMPLANT

## 2019-07-05 NOTE — Transfer of Care (Signed)
Immediate Anesthesia Transfer of Care Note  Patient: Brandi Crosby  Procedure(s) Performed: OPEN REDUCTION INTERNAL FIXATION (ORIF) TIBIAL PLATEAU (Left )  Patient Location: PACU  Anesthesia Type:General  Level of Consciousness: drowsy and patient cooperative  Airway & Oxygen Therapy: Patient Spontanous Breathing and Patient connected to nasal cannula oxygen  Post-op Assessment: Report given to RN and Post -op Vital signs reviewed and stable  Post vital signs: Reviewed and stable  Last Vitals:  Vitals Value Taken Time  BP    Temp    Pulse    Resp    SpO2      Last Pain:  Vitals:   07/05/19 0454  TempSrc: Oral  PainSc:       Patients Stated Pain Goal: 3 (18/84/16 6063)  Complications: No apparent anesthesia complications

## 2019-07-05 NOTE — Anesthesia Preprocedure Evaluation (Addendum)
Anesthesia Evaluation  Patient identified by MRN, date of birth, ID band Patient awake    Reviewed: Allergy & Precautions, NPO status , Patient's Chart, lab work & pertinent test results  Airway Mallampati: II  TM Distance: >3 FB Neck ROM: Full    Dental  (+) Teeth Intact, Dental Advisory Given   Pulmonary former smoker,    breath sounds clear to auscultation       Cardiovascular  Rhythm:Regular Rate:Normal     Neuro/Psych    GI/Hepatic   Endo/Other    Renal/GU      Musculoskeletal   Abdominal   Peds  Hematology   Anesthesia Other Findings   Reproductive/Obstetrics                            Anesthesia Physical Anesthesia Plan  ASA: III  Anesthesia Plan: General   Post-op Pain Management:    Induction:   PONV Risk Score and Plan: 2 and Ondansetron, Dexamethasone and Midazolam  Airway Management Planned: Oral ETT  Additional Equipment: None  Intra-op Plan:   Post-operative Plan: Extubation in OR  Informed Consent: I have reviewed the patients History and Physical, chart, labs and discussed the procedure including the risks, benefits and alternatives for the proposed anesthesia with the patient or authorized representative who has indicated his/her understanding and acceptance.     Dental advisory given  Plan Discussed with: CRNA, Anesthesiologist and Surgeon  Anesthesia Plan Comments:        Anesthesia Quick Evaluation

## 2019-07-05 NOTE — Consult Note (Signed)
Orthopaedic Trauma Service (OTS) Consult   Patient ID: Brandi Crosby MRN: [id number] DOB/AGE: 63-Oct-1957 63 y.o.  Reason for Consult:Left tibial plateau fracture Referring Physician: Dr. Malena Catholic, MD [facility]  HPI: [name] is an 64 y.o. femalewho is being seen in consultation at the request of Dr. Tamera Punt for evaluation of left proximal tibia fracture.  The patient was unloading her dishwasher and fell.  She had immediate pain and deformity and inability to bear weight.  She was brought to the emergency room where x-rays showed a left tibial plateau fracture with tibial shaft extension.  Due to the complexity of the injury Dr. Tamera Punt felt this was outside the scope of practice and recommended treatment by an orthopedic traumatologist.  Patient was seen and evaluated on the preoperative holding area.  Currently having pain but it is well controlled.  Unable to move her use her leg secondary to pain.  Denies any numbness or tingling.  Denies any other injuries.  She lives at home with her husband.  She denies any tobacco use.  Past Medical History:  Diagnosis Date  . Allergy   . Anxiety   . Depression   . Hyperlipemia   . Lung mass   . SCL ca dx'd 08/2017  . Shingles     Past Surgical History:  Procedure Laterality Date  . DENTAL SURGERY  2010   implant  . NODE DISSECTION Left 09/30/2017   Procedure: NODE DISSECTION;  Surgeon: Grace Isaac, MD;  Location: [place];  Service: Thoracic;  Laterality: Left;  . RIGHT OOPHORECTOMY    . TONSILLECTOMY AND ADENOIDECTOMY    . VIDEO ASSISTED THORACOSCOPY (VATS)/WEDGE RESECTION Left 09/30/2017   Procedure: VIDEO ASSISTED THORACOSCOPY (VATS)/LUNG RESECTION;  Surgeon: Grace Isaac, MD;  Location: [place];  Service: Thoracic;  Laterality: Left;  [name] VIDEO BRONCHOSCOPY N/A 09/30/2017   Procedure: VIDEO BRONCHOSCOPY;  Surgeon: Grace Isaac, MD;  Location: [facility] OR;  Service: Thoracic;  Laterality: N/A;  .  WISDOM TOOTH EXTRACTION      Family History  Problem Relation Age of Onset  . Melanoma Mother   . Early death Mother   . Alzheimer's disease Father   . Hyperlipidemia Father   . Heart disease Sister     Social History:  reports that she quit smoking about 21 months ago. Her smoking use included cigarettes. She has a 3.75 pack-year smoking history. She has never used smokeless tobacco. She reports current alcohol use of about 14.0 standard drinks of alcohol per week. She reports that she does not use drugs.  Allergies:  Allergies  Allergen Reactions  . Augmentin [Amoxicillin-Pot Clavulanate] Diarrhea and Nausea And Vomiting  . Penicillins Nausea And Vomiting and Other (See Comments)    Has patient had a PCN reaction causing immediate rash, facial/tongue/throat swelling, SOB or lightheadedness with hypotension: Yes Has patient had a PCN reaction causing severe rash involving mucus membranes or skin necrosis: No Has patient had a PCN reaction that required hospitalization: No Has patient had a PCN reaction occurring within the last 10 years: Yes If all of the above answers are "NO", then may proceed with Cephalosporin use.     Medications:  No current facility-administered medications on file prior to encounter.   Current Outpatient Medications on File Prior to Encounter  Medication Sig Dispense Refill  . ALPRAZolam (XANAX) 0.5 MG tablet Take 0.5 mg by mouth at bedtime as needed for anxiety or sleep.     . Ascorbic Acid (VITAMIN  C) 1000 MG tablet Take 1,000 mg by mouth daily.      . cholecalciferol (VITAMIN D) 1000 units tablet Take 1,000 Units by mouth daily.    [name] escitalopram (LEXAPRO) 10 MG tablet Take 10 mg by mouth daily.     . prochlorperazine (COMPAZINE) 10 MG tablet Take 1 tablet (10 mg total) by mouth every 6 (six) hours as needed for nausea or vomiting. (Patient not taking: Reported on 12/24/2018) 30 tablet 0    ROS: Constitutional: No fever or chills Vision: No changes  in vision ENT: No difficulty swallowing CV: No chest pain Pulm: No SOB or wheezing GI: No nausea or vomiting GU: No urgency or inability to hold urine Skin: No poor wound healing Neurologic: No numbness or tingling Psychiatric: No depression or anxiety Heme: No bruising Allergic: No reaction to medications or food   Exam: Blood pressure 131/74, pulse 91, temperature 99.1 F (37.3 C), temperature source Oral, resp. rate 14, height 5\' 5"  (1.651 m), weight 61.4 kg, SpO2 97 %. General: No acute distress Orientation: Awake alert and oriented x3 Mood and Affect: Cooperative and pleasant Gait: Unable to assess secondary to fracture Coordination and balance: Within normal limits  Left lower extremity: Knee immobilizer in place.  Ace wrap is in place.  Compartments are soft compressible.  Active dorsiflexion plantarflexion of the foot and ankle.  Sensation is grossly intact.  She has brisk cap refill of less than 2 seconds.  Significant tenderness palpation about the proximal tibia.  No deformity or tenderness about the ankle.  Unable to tolerate range of motion of the hip.  Right lower extremity: Skin without lesions. No tenderness to palpation. Full painless ROM, full strength in each muscle groups without evidence of instability.   Medical Decision Making: Data: Imaging: X-rays and CT scan of the left knee and tibia show a Schatzker 2 lateral plateau fracture with tibial shaft extension.  Labs:  Results for orders placed or performed during the hospital encounter of 07/03/19 (from the past 24 hour(s))  MRSA PCR Screening     Status: None   Collection Time: 07/04/19  6:12 PM   Specimen: Nasal Mucosa; Nasopharyngeal  Result Value Ref Range   MRSA by PCR NEGATIVE NEGATIVE    Imaging or Labs ordered: None  Medical history and chart was reviewed and case discussed with medical provider.  Assessment/Plan: 64 year old female status post fall with left tibial plateau fracture with  tibial shaft extension.  Recommend proceeding with open reduction internal fixation due to the unstable nature of her injury.  Risks and benefits were discussed with the patient.  Risks included but not limited to bleeding, infection, malunion, nonunion, hardware failure, or irritation, nerve and blood vessel injury, posttraumatic arthritis, knee stiffness, DVT, even the possibility of anesthetic complications.  The patient agreed to proceed with surgery and consent was obtained.  Shona Needles, MD [facility] [phone] (office) [email or web address]

## 2019-07-05 NOTE — Plan of Care (Signed)

## 2019-07-05 NOTE — Op Note (Signed)
Orthopaedic Surgery Operative Note (CSN: [id number] ) Date of Surgery: 07/05/2019  Admit Date: 07/03/2019   Diagnoses: Pre-Op Diagnoses: Left bicondylar tibial plateau fracture Left tibial shaft fracture   Post-Op Diagnosis: Left bicondylar tibial plateau fracture Left tibial shaft fracture  Left peripheral lateral meniscus tear  Procedures: 1. CPT 27536-Open reduction internal fixation of right bicondylar tibial plateau fracture 2. CPT 27758-Open reduction internal fixation of left tibial shaft fracture 3. CPT 27403-Repair of left lateral meniscus tear  Surgeons : Primary: Nacole Fluhr, Thomasene Lot, MD  Assistant: Patrecia Pace, PA-C  Location: OR 3   Anesthesia:General  Antibiotics: Ancef 2g preop, 1 g of vancomycin powder topically placed  Tourniquet time: Total Tourniquet Time Documented: Thigh (Left) - 90 minutes Total: Thigh (Left) - 90 minutes  Estimated Blood OEVO:350 mL  Complications:None  Specimens:None  Implants: Implant Name Type Inv. Item Serial No. Manufacturer Lot No. LRB No. Used Action  BONE CANC CHIPS 20CC - [id number] Bone Implant BONE CANC CHIPS 20CC [id number] [employer]  Left 1 Implanted  SCREW CORTEX 3.5 26MM - [id number] Screw SCREW CORTEX 3.5 26MM  [employer]  Left 1 Implanted  SCREW CORTEX 3.5 28MM - [id number] Screw SCREW CORTEX 3.5 28MM  [employer]  Left 3 Implanted  SCREW HEADED ST 3.5X75 - [id number] Screw SCREW HEADED ST 3.5X75  [employer]  Left 1 Implanted  SCREW LOCKING 3.5X70MM VA - [id number] Screw SCREW LOCKING 3.5X70MM VA  [employer]  Left 3 Implanted  12 hole plate synthes     [employer]  Left 1 Implanted     Indications for Surgery: 64 year old female status post fall with left tibial plateau fracture with tibial shaft extension. I recommended proceeding with open reduction internal fixation due to the unstable nature of her injury.  Risks and benefits were discussed with the patient.  Risks included  but not limited to bleeding, infection, malunion, nonunion, hardware failure, or irritation, nerve and blood vessel injury, posttraumatic arthritis, knee stiffness, DVT, even the possibility of anesthetic complications.  The patient agreed to proceed with surgery and consent was obtained.  Operative Findings: 1.  Left bicondylar tibial plateau fracture with tibial shaft extension with significant impaction of the lateral articular surface 2.  Open reduction internal fixation using Synthes VA 3.5 mm LCP proximal tibial locking plate 3.  Peripheral lateral meniscus tear at the meniscocapsular junction treated with a repair using #2 FiberWire horizontal mattress sutures through the capsule.  Procedure: The patient was identified in the preoperative holding area. Consent was confirmed with the patient and their family and all questions were answered. The operative extremity was marked after confirmation with the patient. she was then brought back to the operating room by our anesthesia colleagues.  She was placed under general anesthetic and carefully transferred over to a radiolucent flat top table.  A bump was placed under her operative hip.  A nonsterile tourniquet was placed to her upper thigh.  The left lower extremity was then prepped and draped in usual sterile fashion.  A timeout was performed to verify the patient, the procedure, and the extremity. Preoperative antibiotics were dosed.  Fluoroscopic imaging was obtained to show the unstable nature of her injury.  An Esmarch was used to exsanguinate the extremity.  The tourniquet was inflated to 300 mmHg.  Total tourniquet time as noted above.  An anterior lateral incision was carried down through skin subcutaneous tissue.  I split the IT band in line with my incision just lateral to the  patellar tendon.  I developed the interval between the capsule and the IT band.  I performed subperiosteal dissection along the lateral condyle all the way back to the  posterior aspect of the tibia.  I was able to palpate the fibular head.  I resected portion of the anterior fat pad.  A submeniscal arthrotomy was performed.  The capsule was tagged with #1 Vicryl sutures.  Here I encountered a peripheral meniscus tear at the meniscocapsular junction.  I used a #2 FiberWire to place horizontal mattress sutures through the capsule through the meniscus.  A total of 4 horizontal mattress sutures were placed around the periphery of the meniscus.  These sutures were tagged for later repair.  I then focused on the articular impaction.  She had significant lateral joint involvement with significant impaction of the central portion of her lateral condyle.  Using a Cobb elevator I was able to elevate the articular surface and restore the joint.  This was held provisionally with 1.6 mm K wires placed from medial to lateral.  I then used crushed cancellous allograft to backfilled the bone void that was left from the disimpaction.  I used a tamp to help provide some structural support to the articular surface.  The lateral wall of the condyle was then reduced and held with a reduction tenaculum.  Fluoroscopic imaging showed the reduction of the joint was nearly anatomic.  A 3.5 millimeter screw was placed to hold the lateral condyle to the remainder of the metaphysis.  I was able then to remove my clamp.  I then chose a 12 hole Synthes VA LCP 3.5 mm proximal tibial locking plate.  I slid this submuscularly along the lateral cortex of the tibia.  I appropriately aligned the tibial shaft fracture on AP and lateral view using fluoroscopy.  Once I was pleased with the reduction and placement of the plate I then placed a nonlocking screw into the proximal segment to bring this portion of plate flush to bone.  A percutaneous 3.5 millimeter screw was placed through the plate into the distal portion of the tibial shaft.  Another nonlocking screw was placed into the proximal portion of the tibial  shaft to bring the proximal portion of the plate to bone as well and improve the coronal alignment.  Once I was pleased with the provisional fixation and alignment I then placed three more nonlocking screws through percutaneous techniques.  I placed 3 locking screws into the proximal segment to raft the disimpacted articular surface.  Final fluoroscopic imaging was obtained.  The incision was then copiously irrigated.  A free needle was used to bring the tag sutures for the capsule through the plate and tied these down.  The meniscus repair sutures were then tied down to the capsule completing the repair.  A gram of vancomycin powder was placed into the incision.  The IT band was closed with 0 Vicryl suture.  The skin was closed with 2-0 Vicryl and 3-0 Monocryl.  Steri-Strips were used to reinforce the incision.  A sterile dressing was placed and her leg was wrapped in a compressive wrap.  She was placed back into her knee immobilizer.  She was then awoken from anesthesia and taken to the PACU in stable condition.  Post Op Plan/Instructions: Patient will be nonweightbearing to the left lower extremity.  She will receive postoperative Ancef.  She will receive Lovenox for DVT prophylaxis while inpatient and be discharged home on aspirin 325 mg twice daily.  She will  be transition to a hinged knee brace to be unlocked for a range of motion as tolerated.  We will have her work with physical therapy.  I was present and performed the entire surgery.  Patrecia Pace, PA-C did assist me throughout the case. An assistant was necessary given the difficulty in approach, maintenance of reduction and ability to instrument the fracture.   Katha Hamming, MD [facility]

## 2019-07-05 NOTE — Anesthesia Procedure Notes (Signed)
Procedure Name: Intubation Date/Time: 07/05/2019 7:37 AM Performed by: Moshe Salisbury, CRNA Pre-anesthesia Checklist: Patient identified, Emergency Drugs available, Suction available and Patient being monitored Patient Re-evaluated:Patient Re-evaluated prior to induction Oxygen Delivery Method: Circle System Utilized Preoxygenation: Pre-oxygenation with 100% oxygen Induction Type: IV induction Ventilation: Mask ventilation without difficulty Laryngoscope Size: Mac and 3 Grade View: Grade I Tube type: Oral Tube size: 7.5 mm Number of attempts: 1 Airway Equipment and Method: Stylet Placement Confirmation: ETT inserted through vocal cords under direct vision,  positive ETCO2 and breath sounds checked- equal and bilateral Secured at: 20 cm Tube secured with: Tape Dental Injury: Teeth and Oropharynx as per pre-operative assessment

## 2019-07-05 NOTE — Progress Notes (Signed)
Orthopedic Tech Progress Note Patient Details:  Brandi Crosby [birth year] [id number]  Patient ID: Brandi Crosby, female   DOB: 1956-02-27, 64 y.o.   MRN: [id number]   [name] 07/05/2019, 12:12 PMCalled Brace into [facility].

## 2019-07-05 NOTE — Anesthesia Postprocedure Evaluation (Signed)
Anesthesia Post Note  Patient: Brandi Crosby  Procedure(s) Performed: OPEN REDUCTION INTERNAL FIXATION (ORIF) TIBIAL PLATEAU (Left )     Patient location during evaluation: PACU Anesthesia Type: General Level of consciousness: awake and alert Pain management: pain level controlled Vital Signs Assessment: post-procedure vital signs reviewed and stable Respiratory status: spontaneous breathing, nonlabored ventilation, respiratory function stable and patient connected to nasal cannula oxygen Cardiovascular status: blood pressure returned to baseline and stable Postop Assessment: no apparent nausea or vomiting Anesthetic complications: no    Last Vitals:  Vitals:   07/05/19 1125 07/05/19 1634  BP: 140/72 138/70  Pulse: 91 94  Resp: 18 16  Temp: 37.6 C 37.3 C  SpO2: 100% 100%    Last Pain:  Vitals:   07/05/19 1634  TempSrc: Oral  PainSc:                  Willma Obando COKER

## 2019-07-05 NOTE — Progress Notes (Signed)
Report given to [id number] for surgery @0556 

## 2019-07-05 NOTE — Discharge Instructions (Addendum)
[facility] Discharge Instructions   General Discharge Instructions  WEIGHT BEARING STATUS: Non-weightbearing left leg  RANGE OF MOTION/ACTIVITY: Okay for unrestricted range of motion of the knee in the hinge knee brace. Should lock brace in full extension at night  Wound Care: Okay to remove surgical dressing on 07/07/2019 or 07/08/2019. Incisions can be left open to air if there is no drainage. If incision continues to have drainage, follow wound care instructions below. Okay to shower if no drainage from incisions. Leave steri-strips in place, it is okay for them to get wet when showering  DVT/PE prophylaxis: Aspirin 325 mg twice daily x 30 days  BONE HEALING: Recommended that you increase to 2,000 units daily of Vit D3 supplementation for the next 2 months  DIET: as you were eating previously.  Can use over the counter stool softeners and bowel preparations, such as Miralax, to help with bowel movements.  Narcotics can be constipating.  Be sure to drink plenty of fluids  PAIN MEDICATION USE AND EXPECTATIONS  You have likely been given narcotic medications to help control your pain.  After a traumatic event that results in an fracture (broken bone) with or without surgery, it is ok to use narcotic pain medications to help control one's pain.  We understand that everyone responds to pain differently and each individual patient will be evaluated on a regular basis for the continued need for narcotic medications. Ideally, narcotic medication use should last no more than 6-8 weeks (coinciding with fracture healing).   As a patient it is your responsibility as well to monitor narcotic medication use and report the amount and frequency you use these medications when you come to your office visit.   We would also advise that if you are using narcotic medications, you should take a dose prior to therapy to maximize you participation.  IF YOU ARE ON NARCOTIC MEDICATIONS IT IS NOT  PERMISSIBLE TO OPERATE A MOTOR VEHICLE (MOTORCYCLE/CAR/TRUCK/MOPED) OR HEAVY MACHINERY DO NOT MIX NARCOTICS WITH OTHER CNS (CENTRAL NERVOUS SYSTEM) DEPRESSANTS SUCH AS ALCOHOL   STOP SMOKING OR USING NICOTINE PRODUCTS!!!!  As discussed nicotine severely impairs your body's ability to heal surgical and traumatic wounds but also impairs bone healing.  Wounds and bone heal by forming microscopic blood vessels (angiogenesis) and nicotine is a vasoconstrictor (essentially, shrinks blood vessels).  Therefore, if vasoconstriction occurs to these microscopic blood vessels they essentially disappear and are unable to deliver necessary nutrients to the healing tissue.  This is one modifiable factor that you can do to dramatically increase your chances of healing your injury.    (This means no smoking, no nicotine gum, patches, etc)  DO NOT USE NONSTEROIDAL ANTI-INFLAMMATORY DRUGS (NSAID'S)  Using products such as Advil (ibuprofen), Aleve (naproxen), Motrin (ibuprofen) for additional pain control during fracture healing can delay and/or prevent the healing response.  If you would like to take over the counter (OTC) medication, Tylenol (acetaminophen) is ok.  However, some narcotic medications that are given for pain control contain acetaminophen as well. Therefore, you should not exceed more than 4000 mg of tylenol in a day if you do not have liver disease.  Also note that there are may OTC medicines, such as cold medicines and allergy medicines that my contain tylenol as well.  If you have any questions about medications and/or interactions please ask your doctor/PA or your pharmacist.      ICE AND ELEVATE INJURED/OPERATIVE EXTREMITY  Using ice and elevating the injured extremity above  your heart can help with swelling and pain control.  Icing in a pulsatile fashion, such as 20 minutes on and 20 minutes off, can be followed.    Do not place ice directly on skin. Make sure there is a barrier between to skin and  the ice pack.    Using frozen items such as frozen peas works well as the conform nicely to the are that needs to be iced.  USE AN ACE WRAP OR TED HOSE FOR SWELLING CONTROL  In addition to icing and elevation, Ace wraps or TED hose are used to help limit and resolve swelling.  It is recommended to use Ace wraps or TED hose until you are informed to stop.    When using Ace Wraps start the wrapping distally (farthest away from the body) and wrap proximally (closer to the body)   Example: If you had surgery on your leg or thing and you do not have a splint on, start the ace wrap at the toes and work your way up to the thigh        If you had surgery on your upper extremity and do not have a splint on, start the ace wrap at your fingers and work your way up to the upper arm    [place]: [phone]   VISIT OUR WEBSITE FOR ADDITIONAL INFORMATION: [email or web address]     Discharge Wound Care Instructions  Do NOT apply any ointments, solutions or lotions to pin sites or surgical wounds.  These prevent needed drainage and even though solutions like hydrogen peroxide kill bacteria, they also damage cells lining the pin sites that help fight infection.  Applying lotions or ointments can keep the wounds moist and can cause them to breakdown and open up as well. This can increase the risk for infection. When in doubt call the office.  Surgical incisions should be dressed daily.  If any drainage is noted, use one layer of adaptic, then gauze, Kerlix, and an ace wrap.  Once the incision is completely dry and without drainage, it may be left open to air out.  Showering may begin 36-48 hours later.  Cleaning gently with soap and water.  Traumatic wounds should be dressed daily as well.    One layer of adaptic, gauze, Kerlix, then ace wrap.  The adaptic can be discontinued once the draining has ceased    If you have a wet to dry dressing: wet the gauze with saline  the squeeze as much saline out so the gauze is moist (not soaking wet), place moistened gauze over wound, then place a dry gauze over the moist one, followed by Kerlix wrap, then ace wrap.

## 2019-07-06 ENCOUNTER — Other Ambulatory Visit: Payer: Self-pay | Admitting: Student

## 2019-07-06 ENCOUNTER — Encounter: Payer: Self-pay | Admitting: *Deleted

## 2019-07-06 LAB — CBC
HCT: 24.9 % — ABNORMAL LOW (ref 36.0–46.0)
Hemoglobin: 8.5 g/dL — ABNORMAL LOW (ref 12.0–15.0)
MCH: 33.3 pg (ref 26.0–34.0)
MCHC: 34.1 g/dL (ref 30.0–36.0)
MCV: 97.6 fL (ref 80.0–100.0)
Platelets: 146 10*3/uL — ABNORMAL LOW (ref 150–400)
RBC: 2.55 MIL/uL — ABNORMAL LOW (ref 3.87–5.11)
RDW: 11.9 % (ref 11.5–15.5)
WBC: 8 10*3/uL (ref 4.0–10.5)
nRBC: 0 % (ref 0.0–0.2)

## 2019-07-06 LAB — VITAMIN D 25 HYDROXY (VIT D DEFICIENCY, FRACTURES): Vit D, 25-Hydroxy: 30.96 ng/mL (ref 30–100)

## 2019-07-06 MED ORDER — METHOCARBAMOL 500 MG PO TABS: 500.0000 mg | ORAL_TABLET | Freq: Four times a day (QID) | ORAL | 0 refills | Status: DC | PRN

## 2019-07-06 MED ORDER — GABAPENTIN 100 MG PO CAPS: 100.0000 mg | ORAL_CAPSULE | Freq: Three times a day (TID) | ORAL | 0 refills | Status: DC

## 2019-07-06 MED ORDER — ASPIRIN EC 325 MG PO TBEC: 325.0000 mg | DELAYED_RELEASE_TABLET | Freq: Two times a day (BID) | ORAL | 0 refills | Status: AC

## 2019-07-06 MED ORDER — ENOXAPARIN SODIUM 40 MG/0.4ML ~~LOC~~ SOLN: 40.0000 mg | SUBCUTANEOUS | Status: DC

## 2019-07-06 MED ORDER — DOCUSATE SODIUM 100 MG PO CAPS: 100.0000 mg | ORAL_CAPSULE | Freq: Two times a day (BID) | ORAL | 0 refills | Status: DC

## 2019-07-06 NOTE — Discharge Summary (Signed)
[facility]thopaedic [facility]uma Service (OTS) Discharge Summary   Patient ID: Brandi Crosby MRN: [id number] DOB/AGE: [birth year] 64 y.o.  Admit date: 07/03/2019 Discharge date: 07/06/2019  Admission Diagnoses: Left tibial plateau fracture  Discharge Diagnoses:  Active Problems:   Closed bicondylar fracture of left tibial plateau   Fracture of tibial shaft, left, closed   Acute lateral meniscus tear of left knee   Tibial plateau fracture, left   Past Medical History:  Diagnosis Date  . Allergy   . Anxiety   . Depression   . Hyperlipemia   . Lung mass   . SCL ca dx'd 08/2017  . Shingles      Procedures Performed: 1. CPT 57017-BLTJ reduction internal fixation of right bicondylar tibial plateau fracture 2. CPT 27758-Open reduction internal fixation of left tibial shaft fracture 3. CPT 27403-Repair of left lateral meniscus tear  Discharged Condition: Good  Hospital Course: Patient presented to [facility] emergency department on 07/03/2019 after sustaining a fall in her kitchen.  Patient was found to have a left tibial plateau fracture.  Orthopedics was consulted and patient was placed in a knee immobilizer.  She was taken to the operating room on 07/05/2019 by Dr. Doreatha Martin for the above procedure.  She tolerated the procedure well without complications.  She was instructed to be nonweightbearing postoperatively.  She was placed in a hinged knee brace for unrestricted range of motion.  Patient was placed in SCDs for DVT prophylaxis.  She began working with physical and occupational therapy starting on postoperative day #1. On 07/06/2019, the patient was tolerating diet, working well with therapies, pain well controlled, vital signs stable, dressings clean, dry, intact and felt stable for discharge to  home with home health physical therapy. Patient will follow up as below and knows to call with questions or concerns.     Consults: None  Significant Diagnostic Studies:  Results for  orders placed or performed during the hospital encounter of 07/03/19 (from the past 168 hour(s))  Respiratory Panel by RT PCR (Flu A&B, Covid) - Nasopharyngeal Swab   Collection Time: 07/03/19 11:27 PM   Specimen: Nasopharyngeal Swab  Result Value Ref Range   SARS Coronavirus 2 by RT PCR NEGATIVE NEGATIVE   Influenza A by PCR NEGATIVE NEGATIVE   Influenza B by PCR NEGATIVE NEGATIVE  CBC with Differential   Collection Time: 07/03/19 11:27 PM  Result Value Ref Range   WBC 11.8 (H) 4.0 - 10.5 K/uL   RBC 3.57 (L) 3.87 - 5.11 MIL/uL   Hemoglobin 11.9 (L) 12.0 - 15.0 g/dL   HCT 35.3 (L) 36.0 - 46.0 %   MCV 98.9 80.0 - 100.0 fL   MCH 33.3 26.0 - 34.0 pg   MCHC 33.7 30.0 - 36.0 g/dL   RDW 12.1 11.5 - 15.5 %   Platelets 209 150 - 400 K/uL   nRBC 0.0 0.0 - 0.2 %   Neutrophils Relative % 79 %   Neutro Abs 9.3 (H) 1.7 - 7.7 K/uL   Lymphocytes Relative 14 %   Lymphs Abs 1.6 0.7 - 4.0 K/uL   Monocytes Relative 7 %   Monocytes Absolute 0.8 0.1 - 1.0 K/uL   Eosinophils Relative 0 %   Eosinophils Absolute 0.0 0.0 - 0.5 K/uL   Basophils Relative 0 %   Basophils Absolute 0.0 0.0 - 0.1 K/uL   Immature Granulocytes 0 %   Abs Immature Granulocytes 0.05 0.00 - 0.07 K/uL  Basic metabolic panel   Collection Time: 07/03/19 11:27 PM  Result Value Ref Range   Sodium 138 135 - 145 mmol/L   Potassium 3.8 3.5 - 5.1 mmol/L   Chloride 106 98 - 111 mmol/L   CO2 20 (L) 22 - 32 mmol/L   Glucose, Bld 122 (H) 70 - 99 mg/dL   BUN 15 8 - 23 mg/dL   Creatinine, Ser 0.88 0.44 - 1.00 mg/dL   Calcium 8.4 (L) 8.9 - 10.3 mg/dL   GFR calc non Af Amer >60 >60 mL/min   GFR calc Af Amer >60 >60 mL/min   Anion gap 12 5 - 15  HIV Antibody (routine testing w rflx)   Collection Time: 07/04/19  1:48 AM  Result Value Ref Range   HIV Screen 4th Generation wRfx NON REACTIVE NON REACTIVE  MRSA PCR Screening   Collection Time: 07/04/19  6:12 PM   Specimen: Nasal Mucosa; Nasopharyngeal  Result Value Ref Range   MRSA by  PCR NEGATIVE NEGATIVE  VITAMIN D 25 Hydroxy (Vit-D Deficiency, Fractures)   Collection Time: 07/06/19  4:41 AM  Result Value Ref Range   Vit D, 25-Hydroxy 30.96 30 - 100 ng/mL  AM CBC   Collection Time: 07/06/19  4:41 AM  Result Value Ref Range   WBC 8.0 4.0 - 10.5 K/uL   RBC 2.55 (L) 3.87 - 5.11 MIL/uL   Hemoglobin 8.5 (L) 12.0 - 15.0 g/dL   HCT 24.9 (L) 36.0 - 46.0 %   MCV 97.6 80.0 - 100.0 fL   MCH 33.3 26.0 - 34.0 pg   MCHC 34.1 30.0 - 36.0 g/dL   RDW 11.9 11.5 - 15.5 %   Platelets 146 (L) 150 - 400 K/uL   nRBC 0.0 0.0 - 0.2 %     Treatments: 1. CPT 27536-Open reduction internal fixation of right bicondylar tibial plateau fracture 2. CPT 27758-Open reduction internal fixation of left tibial shaft fracture 3. CPT 27403-Repair of left lateral meniscus tear  Discharge Exam: General -sitting up in bedside chair, no acute distress.  Pleasant and cooperative  Respiratory -  No increased work of breathing.   Left lower extremity - Hinged knee brace in place.  Dressing with a small amount of drainage through the Ace wrap otherwise clean dry and intact.  Mild to moderately tender with palpation of the knee.  No significant tenderness in the calf or ankle.  Compartments are swollen but compressible.  Ankle dorsiflexion plantarflexion is intact.+ EHL.+ FHL.  Sensation intact to light touch of the dorsum and plantar aspect of the foot.  Neurovascularly intact.  Disposition: Discharge disposition: 06-Home-Health Care Svc       Allergies as of 07/06/2019      Reactions   Augmentin [amoxicillin-pot Clavulanate] Diarrhea, Nausea And Vomiting   Penicillins Nausea And Vomiting, Other (See Comments)   Has patient had a PCN reaction causing immediate rash, facial/tongue/throat swelling, SOB or lightheadedness with hypotension: Yes Has patient had a PCN reaction causing severe rash involving mucus membranes or skin necrosis: No Has patient had a PCN reaction that required  hospitalization: No Has patient had a PCN reaction occurring within the last 10 years: Yes If all of the above answers are "NO", then may proceed with Cephalosporin use.      Medication List    STOP taking these medications   prochlorperazine 10 MG tablet Commonly known as: COMPAZINE     TAKE these medications   ALPRAZolam 0.5 MG tablet Commonly known as: XANAX Take 0.5 mg by mouth at bedtime as needed for anxiety or  sleep.   aspirin EC 325 MG tablet Take 1 tablet (325 mg total) by mouth 2 (two) times daily.   cholecalciferol 1000 units tablet Commonly known as: VITAMIN D Take 1,000 Units by mouth daily.   docusate sodium 100 MG capsule Commonly known as: COLACE Take 1 capsule (100 mg total) by mouth 2 (two) times daily.   escitalopram 10 MG tablet Commonly known as: LEXAPRO Take 10 mg by mouth daily.   gabapentin 100 MG capsule Commonly known as: NEURONTIN Take 1 capsule (100 mg total) by mouth 3 (three) times daily.   methocarbamol 500 MG tablet Commonly known as: ROBAXIN Take 1 tablet (500 mg total) by mouth every 6 (six) hours as needed for muscle spasms.   vitamin C 1000 MG tablet Take 1,000 mg by mouth daily.            Durable Medical Equipment  (From admission, onward)         Start     Ordered   07/06/19 0809  For home use only DME 3 n 1  Once     07/06/19 0808   07/06/19 0809  For home use only DME Walker rolling  Once    Question Answer Comment  Walker: With 5 Inch Wheels   Patient needs a walker to treat with the following condition Closed fracture of left tibial plateau      07/06/19 0808         Follow-up Information    Haddix, Thomasene Lot, MD. Schedule an appointment as soon as possible for a visit in 2 weeks.   Specialty: Orthopedic Surgery Why: For wound re-check, repeat x-rays Contact information: [address] [address] [phone]           Discharge Instructions and Plan: Patient will be discharged to  home. Will be discharged on Aspirin 325 mg twice daily x30 days for DVT prophylaxis.  Patient should remain in her hinged knee brace.  She is okay to have unrestricted range of motion of the knee while in the brace during the day.  She should lock her knee brace in full extension at night.  Patient has been provided with all the necessary DME for discharge. Patient will follow up with Dr. Doreatha Martin in 2 weeks for repeat x-rays and wound check.   Signed:  Leary Roca. Carmie Kanner ?[phone]? (phone) 07/06/2019, 1:04 PM  [facility] [place] [address] [phone] (F)

## 2019-07-06 NOTE — Progress Notes (Signed)
Orthopaedic Trauma Progress Note  S: Doing okay this morning. Just received pain meds, feeling sleepy..  Has already worked with physical therapy, was able to ambulate in the room.  Patient has 3 stairs to enter home. Plans to work on stairs later today versus tomorrow.  O:  Vitals:   07/06/19 0256 07/06/19 0808  BP: 115/80 (!) 102/58  Pulse: 83 89  Resp:  18  Temp: 99.5 F (37.5 C) 100.1 F (37.8 C)  SpO2: 100% 96%    General -sitting up in bedside chair, no acute distress.  Pleasant and cooperative  Respiratory -  No increased work of breathing.   Left lower extremity - Hinged knee brace in place.  Dressing with a small amount of drainage through the Ace wrap otherwise clean dry and intact.  Mild to moderately tender with palpation of the knee.  No significant tenderness in the calf or ankle.  Compartments are swollen but compressible.  Ankle dorsiflexion plantarflexion is intact.+ EHL.+ FHL.  Sensation intact to light touch of the dorsum and plantar aspect of the foot.  Neurovascularly intact.  Imaging: Stable post op imaging.   Labs:  Results for orders placed or performed during the hospital encounter of 07/03/19 (from the past 24 hour(s))  AM CBC     Status: Abnormal   Collection Time: 07/06/19  4:41 AM  Result Value Ref Range   WBC 8.0 4.0 - 10.5 K/uL   RBC 2.55 (L) 3.87 - 5.11 MIL/uL   Hemoglobin 8.5 (L) 12.0 - 15.0 g/dL   HCT 24.9 (L) 36.0 - 46.0 %   MCV 97.6 80.0 - 100.0 fL   MCH 33.3 26.0 - 34.0 pg   MCHC 34.1 30.0 - 36.0 g/dL   RDW 11.9 11.5 - 15.5 %   Platelets 146 (L) 150 - 400 K/uL   nRBC 0.0 0.0 - 0.2 %    Assessment: 64 year old female status post fall, 1 Day Post-Op   Injuries: Left tibial plateau fracture status post ORIF  Weightbearing: NWB LLE  Insicional and dressing care: Plan to change dressing tomorrow  Showering: Okay to begin showering on 07/08/2019  Orthopedic device(s): Hinged knee brace left lower extremity.  Okay for unrestricted range of  motion of the knee in the brace during the day.  Locked in full extension at night.  CV/Blood loss: Acute blood loss anemia, Hgb 8.5 this morning.  We will hold Lovenox today. Hemodynamically stable  Pain management:  1. Tylenol 650 mg q 6 hours scheduled 2. Robaxin 500 mg q 6 hours PRN 3. Oxycodone 5-15 mg q 4 hours PRN 4. Neurontin 100 mg TID 5. Dilaudid 1 mg q 3 hours PRN 6.  Xanax 0.5 mg at bedtime PRN  VTE prophylaxis: Lovenox held today.  Will recheck CBC tomorrow and start Lovenox if hemoglobin has stabilized.  Will discharge on aspirin 325 mg twice daily  SCDs: Ordered but not currently in place.  Nursing will reapply once patient returns to bed.  ID:  Ancef 2gm post op  Foley/Lines: No foley, KVO IVFs  Medical co-morbidities: Anxiety, depression, hyperlipidemia  Impediments to Fracture Healing: Vitamin D level pending, will start supplementation as indicated  Dispo: PT/OT eval today, recommending home health.   Physical therapy plans to return later today as time permits to work on stairs.  If not today, the plan to see her again tomorrow.  Discharge likely in the next 24 to 48 hours.  Follow - up plan: 2 weeks for repeat x-rays  Contact  information:  Katha Hamming MD, Patrecia Pace PA-C   [facility] [phone] (office) [email or web address]

## 2019-07-06 NOTE — Progress Notes (Signed)
Pt given discharge instructions and gone over with her. She verbalizes understanding. All questions answered. Ace dressing reinforced per order. Pt waiting on equipment to be delivered.

## 2019-07-06 NOTE — Evaluation (Signed)
Occupational Therapy Evaluation Patient Details Name: Brandi Crosby MRN: [id number] DOB: 1955/12/18 Today's Date: 07/06/2019    History of Present Illness 64 yo admitted after fall unloading dishwasher with left tibial plateau and shaft fx s/p ORIF. PMHx: SCL CA, HLD, depression, anxiety   Clinical Impression   Patient is s/p ORIF LLE tibial plateau fx surgery resulting in functional limitations due to the deficits listed below (see OT problem list). Pt currently min (A) to complete basic transfer but requesting to sit due to fatigue. Pt reports being awake since 4 am. Pt wanting to d/c today however patient has not completed transfer to demonstrate house entry or stairs with PT. Patient will benefit from skilled OT acutely to increase independence and safety with ADLS to allow discharge home.  P    Follow Up Recommendations  No OT follow up    Equipment Recommendations  3 in 1 bedside commode;Other (comment)(RW)    Recommendations for Other Services       Precautions / Restrictions Precautions Precautions: Fall Required Braces or Orthoses: Other Brace Other Brace: hinge locked in extension at night - full Rom during the day  Restrictions Weight Bearing Restrictions: Yes LLE Weight Bearing: Non weight bearing      Mobility Bed Mobility Overal bed mobility: Needs Assistance Bed Mobility: Supine to Sit     Supine to sit: Min assist     General bed mobility comments: requires A to bring L LE toward the eob. pt requires A to support eob  Transfers Overall transfer level: Needs assistance   Transfers: Sit to/from [employer] Transfers Sit to Stand: Min assist Stand pivot transfers: Min assist       General transfer comment: able to power up with bil UE with RW use. pt needs cues to maintain NWB LLE, cues for hand placement and sequence    Balance Overall balance assessment: Mild deficits observed, not formally tested                                         ADL either performed or assessed with clinical judgement   ADL Overall ADL's : Needs assistance/impaired Eating/Feeding: Independent   Grooming: Wash/dry hands;Wash/dry face;Modified independent;Sitting   Upper Body Bathing: Supervision/ safety;Sitting   Lower Body Bathing: Moderate assistance;Sit to/from stand   Upper Body Dressing : Supervision/safety;Sitting   Lower Body Dressing: Maximal assistance Lower Body Dressing Details (indicate cue type and reason): talked about clothing options for home Toilet Transfer: Minimal assistance;RW;BSC   Toileting- Clothing Manipulation and Hygiene: Supervision/safety;Sitting/lateral lean Toileting - Clothing Manipulation Details (indicate cue type and reason): pt requires increased time but able to lateral lean to complete   Tub/Shower Transfer Details (indicate cue type and reason): will sponge bath at this time Functional mobility during ADLs: Minimal assistance;Rolling walker General ADL Comments: pt wet from [place] on arrival. pt positioned on 3n1 to void bladder and then in chair for eating. facetime completed with spouse for education and answer questions. visual of brace an dhow to lock /unlock hinge brace.   Pt did not wear mask during session due to tolerance.    Vision Baseline Vision/History: Wears glasses Wears Glasses: Reading only Vision Assessment?: No apparent visual deficits     Perception     Praxis      Pertinent Vitals/Pain Pain Assessment: 0-10 Pain Score: 5  Pain Location: left knee Pain Descriptors / Indicators: Aching  Pain Intervention(s): Monitored during session;Repositioned     Hand Dominance Right   Extremity/Trunk Assessment Upper Extremity Assessment Upper Extremity Assessment: Overall WFL for tasks assessed   Lower Extremity Assessment Lower Extremity Assessment: Defer to PT evaluation LLE Deficits / Details: limited by pain knee ROM grossly 30 degrees of flexion PROM,  hinged knee brace explained to pt and spouse via facetime   Cervical / Trunk Assessment Cervical / Trunk Assessment: Normal   Communication Communication Communication: No difficulties   Cognition Arousal/Alertness: Awake/alert Behavior During Therapy: WFL for tasks assessed/performed Overall Cognitive Status: Within Functional Limits for tasks assessed                                     General Comments  drainage at calf LLE    Exercises General Exercises - Lower Extremity Heel Slides: AAROM;Left;Seated;10 reps   Shoulder Instructions      Home Living Family/patient expects to be discharged to:: Private residence Living Arrangements: Spouse/significant other Available Help at Discharge: Family;Available 24 hours/day Type of Home: House Home Access: Stairs to enter [employer] of Steps: 3 Entrance Stairs-Rails: None Home Layout: Multi-level;Able to live on main level with bedroom/bathroom     Bathroom Shower/Tub: Occupational psychologist: Standard     Home Equipment: None   Additional Comments: dog name "splash"        Prior Functioning/Environment Level of Independence: Independent                 OT Problem List: Decreased strength;Impaired balance (sitting and/or standing);Decreased activity tolerance;Decreased safety awareness;Pain      OT Treatment/Interventions: Self-care/ADL training;Therapeutic exercise;Energy conservation;DME and/or AE instruction;Manual therapy;Therapeutic activities;Patient/family education;Balance training    OT Goals(Current goals can be found in the care plan section) Acute Rehab OT Goals Patient Stated Goal: return home today OT Goal Formulation: With patient Time For Goal Achievement: 07/20/19 Potential to Achieve Goals: Good  OT Frequency: Min 2X/week   Barriers to D/C:            Co-evaluation              AM-PAC OT "6 Clicks" Daily Activity     Outcome Measure Help  from another person eating meals?: None Help from another person taking care of personal grooming?: None Help from another person toileting, which includes using toliet, bedpan, or urinal?: A Little Help from another person bathing (including washing, rinsing, drying)?: A Little Help from another person to put on and taking off regular upper body clothing?: A Little Help from another person to put on and taking off regular lower body clothing?: A Lot 6 Click Score: 19   End of Session Equipment Utilized During Treatment: Gait belt;Rolling walker;Other (comment)(hinge brace) Nurse Communication: Mobility status;Precautions;Weight bearing status  Activity Tolerance: Patient tolerated treatment well Patient left: in chair;with call bell/phone within reach;with chair alarm set  OT Visit Diagnosis: Unsteadiness on feet (R26.81);Muscle weakness (generalized) (M62.81)                Time: 8675-4492 OT Time Calculation (min): 37 min Charges:  OT General Charges $OT Visit: 1 Visit OT Evaluation $OT Eval Moderate Complexity: 1 Mod   B[place], OTR/L  Acute Rehabilitation Services Pager: [phone] Office: [phone] .   Jeri Modena 07/06/2019, 9:12 AM

## 2019-07-06 NOTE — Progress Notes (Signed)
Physical Therapy Treatment Patient Details Name: Brandi Crosby MRN: [id number] DOB: 09/15/1955 Today's Date: 07/06/2019    History of Present Illness 64 yo admitted after fall unloading dishwasher with left tibial plateau and shaft fx s/p ORIF. PMHx: SCL CA, HLD, depression, anxiety    PT Comments    Pt and spouse present for this session with pt able to practice bed mobility, basic transfers, gait, stairs, and educated for HEP. Handout for HEp and stairs provided with pt and spouse voicing comfort and understanding of education and desire to return home today. MD, RN and case manager notified of above.    Follow Up Recommendations  Home health PT     Equipment Recommendations  Rolling walker with 5" wheels;3in1 (PT)    Recommendations for Other Services       Precautions / Restrictions Precautions Precautions: Fall Required Braces or Orthoses: Other Brace Other Brace: hinge locked in extension at night - full Rom during the day  Restrictions Weight Bearing Restrictions: Yes LLE Weight Bearing: Non weight bearing    Mobility  Bed Mobility Overal bed mobility: Needs Assistance Bed Mobility: Supine to Sit     Supine to sit: Min assist     General bed mobility comments: min assist to move LLE with use of rail, increased time  Transfers Overall transfer level: Needs assistance   Transfers: Sit to/from Stand;Stand Pivot Transfers Sit to Stand: Min guard Stand pivot transfers: Min assist       General transfer comment: cues for hand placement with guarding to rise from bed, BSC and recliner. Stand pivot with min assist without RW with cues for sequence  Ambulation/Gait Ambulation/Gait assistance: Min guard Gait Distance (Feet): 10 Feet Assistive device: Rolling walker (2 wheeled) Gait Pattern/deviations: Step-to pattern   Gait velocity interpretation: 1.31 - 2.62 ft/sec, indicative of limited community ambulator General Gait Details: cues for posture and  sequence, pt able to maintain NWB throughout   Stairs Stairs: Yes Stairs assistance: Min assist Stair Management: Step to pattern;Backwards;With walker Number of Stairs: 3 General stair comments: cues for sequence, assist to stabilize RW, spouse present and educated with handout provided   Wheelchair Mobility    Modified Rankin (Stroke Patients Only)       Balance Overall balance assessment: Mild deficits observed, not formally tested                                          Cognition Arousal/Alertness: Awake/alert Behavior During Therapy: WFL for tasks assessed/performed Overall Cognitive Status: Within Functional Limits for tasks assessed                                        Exercises General Exercises - Lower Extremity Long Arc Quad[place];Left;Seated;10 reps Hip Flexion/Marching: AAROM;Left;Seated;10 reps    General Comments General comments (skin integrity, edema, etc.): drainage at calf LLE      Pertinent Vitals/Pain Pain Assessment: 0-10 Pain Score: 6  Pain Location: left knee Pain Descriptors / Indicators: Aching;Guarding Pain Intervention(s): Limited activity within patient's tolerance;RN gave pain meds during session;Monitored during session;Repositioned    Home Living Family/patient expects to be discharged to:: Private residence Living Arrangements: Spouse/significant other Available Help at Discharge: Family;Available 24 hours/day Type of Home: House Home Access: Stairs to enter Entrance Stairs-Rails: None Home Layout:  Multi-level;Able to live on main level with bedroom/bathroom Home Equipment: None Additional Comments: dog name "splash"      Prior Function Level of Independence: Independent          PT Goals (current goals can now be found in the care plan section) Acute Rehab PT Goals Patient Stated Goal: return home today Progress towards PT goals: Progressing toward goals    Frequency    Min  5X/week      PT Plan Current plan remains appropriate    Co-evaluation              AM-PAC PT "6 Clicks" Mobility   Outcome Measure  Help needed turning from your back to your side while in a flat bed without using bedrails?: A Little Help needed moving from lying on your back to sitting on the side of a flat bed without using bedrails?: A Little Help needed moving to and from a bed to a chair (including a wheelchair)?: A Little Help needed standing up from a chair using your arms (e.g., wheelchair or bedside chair)?: A Little Help needed to walk in hospital room?: A Little Help needed climbing 3-5 steps with a railing? : A Little 6 Click Score: 18    End of Session Equipment Utilized During Treatment: Gait belt;Other (comment)(hinged knee brace) Activity Tolerance: Patient tolerated treatment well Patient left: in chair;with call bell/phone within reach;with chair alarm set;with family/visitor present Nurse Communication: Mobility status;Weight bearing status;Precautions PT Visit Diagnosis: Other abnormalities of gait and mobility (R26.89)     Time: 2831-5176 PT Time Calculation (min) (ACUTE ONLY): 31 min  Charges:  $Gait Training: 8-22 mins $Therapeutic Activity: 8-22 mins                     [place], PT Acute Rehabilitation Services Pager: [phone] Office: [place] 07/06/2019, 12:50 PM

## 2019-07-06 NOTE — TOC Transition Note (Signed)
Transition of Care [facility]) - CM/SW Discharge Note   Patient Details  Name: Brandi Crosby MRN: [id number] Date of Birth: 1955/12/31  Transition of Care [facility]) CM/SW Contact:  Sharin Mons, RN Phone Number: 07/06/2019, 3:41 PM   Clinical Narrative:  Admitted with left tibial plateau and shaft fx ,s/p ORIF 07/05/2019. PMHx: SCL CA, HLD, depression, anxiety.  Pt will transition to home today with [facility] services to follow. [place] provider will be [facility].  Husband to provide transportation to home.  Final next level of care: [place] Barriers to Discharge: No Barriers Identified   Patient Goals and CMS Choice     Choice offered to / list presented to : Patient  Discharge Placement   Discharge Plan and Services                DME Arranged: 3-N-1, Walker rolling DME Agency: [employer] Date DME Agency Contacted: 07/06/19 Time DME Agency Contacted: [id number] Representative spoke with at DME Agency: [place]: PT [place]: [place] Date [place]: 07/06/19 Time [place]: [place] Representative spoke with at [place]: [place] ([place]) Interventions     Readmission Risk Interventions No flowsheet data found.

## 2019-07-06 NOTE — Evaluation (Signed)
Physical Therapy Evaluation Patient Details Name: Brandi Crosby MRN: [id number] DOB: 1955-09-26 Today's Date: 07/06/2019   History of Present Illness  64 yo admitted after fall unloading dishwasher with left tibial plateau and shaft fx s/p ORIF. PMHx: SCL CA, HLD, depression, anxiety  Clinical Impression  Pt supine on arrival stating pure[place] leaked and she wanted to get up. Pt educated for hinged knee brace, NWB, transfers and gait. Pt has 3 stairs to enter home and will need to practice prior to D/C. Pt with decreased strength, function, mobility and ROM who will benefit from acute therapy to maximize mobility, safety and function for return home.      Follow Up Recommendations Home health PT    Equipment Recommendations  Rolling walker with 5" wheels;3in1 (PT)    Recommendations for Other Services       Precautions / Restrictions Precautions Precautions: Fall Required Braces or Orthoses: Other Brace Other Brace: hinge locked in extension at night - full Rom during the day  Restrictions Weight Bearing Restrictions: Yes LLE Weight Bearing: Non weight bearing      Mobility  Bed Mobility Overal bed mobility: Needs Assistance Bed Mobility: Supine to Sit     Supine to sit: Min assist     General bed mobility comments: requires A to bring L LE toward the eob. pt requires A to support eob  Transfers Overall transfer level: Needs assistance   Transfers: Sit to/from [employer] Transfers Sit to Stand: Min assist Stand pivot transfers: Min assist       General transfer comment: able to power up with bil UE with RW use. pt needs cues to maintain NWB LLE, cues for hand placement and sequence  Ambulation/Gait Ambulation/Gait assistance: Min assist;+2 safety/equipment Gait Distance (Feet): 20 Feet Assistive device: Rolling walker (2 wheeled) Gait Pattern/deviations: Step-to pattern   Gait velocity interpretation: 1.31 - 2.62 ft/sec, indicative of limited  community ambulator General Gait Details: cues for posture and sequence, pt able to maintain NWB throughout  Stairs            Wheelchair Mobility    Modified Rankin (Stroke Patients Only)       Balance Overall balance assessment: Mild deficits observed, not formally tested                                           Pertinent Vitals/Pain Pain Assessment: 0-10 Pain Score: 5  Pain Location: left knee Pain Descriptors / Indicators: Aching Pain Intervention(s): Limited activity within patient's tolerance;Repositioned;RN gave pain meds during session;Monitored during session    Home Living Family/patient expects to be discharged to:: Private residence Living Arrangements: Spouse/significant other Available Help at Discharge: Family;Available 24 hours/day Type of Home: House Home Access: Stairs to enter Entrance Stairs-Rails: None Entrance Stairs-Number of Steps: 3 Home Layout: Multi-level;Able to live on main level with bedroom/bathroom Home Equipment: None Additional Comments: dog name "splash"      Prior Function Level of Independence: Independent               Hand Dominance   Dominant Hand: Right    Extremity/Trunk Assessment   Upper Extremity Assessment Upper Extremity Assessment: Overall WFL for tasks assessed    Lower Extremity Assessment Lower Extremity Assessment: LLE deficits/detail LLE Deficits / Details: limited by pain knee ROM grossly 30 degrees of flexion PROM, hinged knee brace explained to pt and spouse via  facetime    Cervical / Trunk Assessment Cervical / Trunk Assessment: Normal  Communication   Communication: No difficulties  Cognition Arousal/Alertness: Awake/alert Behavior During Therapy: WFL for tasks assessed/performed Overall Cognitive Status: Within Functional Limits for tasks assessed                                        General Comments General comments (skin integrity, edema, etc.):  drainage from calf LLE    Exercises General Exercises - Lower Extremity Heel Slides: AAROM;Left;Seated;10 reps   Assessment/Plan    PT Assessment Patient needs continued PT services  PT Problem List Decreased activity tolerance;Decreased range of motion;Decreased strength;Decreased mobility;Decreased balance;Decreased knowledge of use of DME;Pain       PT Treatment Interventions DME instruction;Gait training;Stair training;Functional mobility training;Therapeutic activities;Patient/family education;Therapeutic exercise;Balance training    PT Goals (Current goals can be found in the Care Plan section)  Acute Rehab PT Goals Patient Stated Goal: return home today PT Goal Formulation: With patient/family Time For Goal Achievement: 07/13/19 Potential to Achieve Goals: Good    Frequency Min 5X/week   Barriers to discharge        Co-evaluation               AM-PAC PT "6 Clicks" Mobility  Outcome Measure Help needed turning from your back to your side while in a flat bed without using bedrails?: A Little Help needed moving from lying on your back to sitting on the side of a flat bed without using bedrails?: A Little Help needed moving to and from a bed to a chair (including a wheelchair)?: A Little Help needed standing up from a chair using your arms (e.g., wheelchair or bedside chair)?: A Little Help needed to walk in hospital room?: A Little Help needed climbing 3-5 steps with a railing? : A Little 6 Click Score: 18    End of Session Equipment Utilized During Treatment: Gait belt;Other (comment)(knee brace) Activity Tolerance: Patient tolerated treatment well Patient left: in chair;with call bell/phone within reach;with chair alarm set Nurse Communication: Mobility status;Weight bearing status;Precautions PT Visit Diagnosis: Other abnormalities of gait and mobility (R26.89)    Time: 4268-3419 PT Time Calculation (min) (ACUTE ONLY): 36 min   Charges:   PT  Evaluation $PT Eval Moderate Complexity: 1 Mod          [place], PT Acute Rehabilitation Services Pager: [phone] Office: [phone]   Velmer Broadfoot B Koree Staheli 07/06/2019, 8:02 AM

## 2019-08-18 ENCOUNTER — Telehealth: Payer: Self-pay | Admitting: Internal Medicine

## 2019-08-18 NOTE — Telephone Encounter (Signed)
Rescheduled per sch msg, pt req. Called and spoke with pt, confirmed 3/4 and 3/10 appts

## 2019-08-20 ENCOUNTER — Ambulatory Visit (HOSPITAL_COMMUNITY): Payer: 59

## 2019-08-20 ENCOUNTER — Other Ambulatory Visit: Payer: 59

## 2019-08-23 ENCOUNTER — Ambulatory Visit: Payer: 59 | Admitting: Internal Medicine

## 2019-08-24 ENCOUNTER — Ambulatory Visit: Payer: 59 | Attending: Student | Admitting: Physical Therapy

## 2019-08-24 ENCOUNTER — Encounter: Payer: Self-pay | Admitting: Physical Therapy

## 2019-08-24 ENCOUNTER — Other Ambulatory Visit: Payer: Self-pay

## 2019-08-24 DIAGNOSIS — M6281 Muscle weakness (generalized): Secondary | ICD-10-CM | POA: Diagnosis present

## 2019-08-24 DIAGNOSIS — M25562 Pain in left knee: Secondary | ICD-10-CM | POA: Diagnosis present

## 2019-08-24 DIAGNOSIS — M25662 Stiffness of left knee, not elsewhere classified: Secondary | ICD-10-CM | POA: Diagnosis present

## 2019-08-24 DIAGNOSIS — R600 Localized edema: Secondary | ICD-10-CM | POA: Diagnosis present

## 2019-08-24 NOTE — Therapy (Signed)
[facility] [address] [place], [place], [address] Phone: [phone]   Fax:  [phone]  Physical Therapy Evaluation  Patient Details  Name: Brandi Crosby MRN: [id number] Date of Birth: Aug 10, 1955 Referring Provider (PT): Dr. Doreatha Martin    Encounter Date: 08/24/2019  PT End of Session - 08/24/19 1742    Visit Number  1    Number of Visits  60    Date for PT Re-Evaluation  10/19/19    Authorization Type  U[employer]    PT Start Time  1104    PT Stop Time  1200    PT Time Calculation (min)  56 min    Activity Tolerance  Patient tolerated treatment well       Past Medical History:  Diagnosis Date  . Allergy   . Anxiety   . Depression   . Hyperlipemia   . Lung mass   . SCL ca dx'd 08/2017  . Shingles     Past Surgical History:  Procedure Laterality Date  . DENTAL SURGERY  2010   implant  . NODE DISSECTION Left 09/30/2017   Procedure: NODE DISSECTION;  Surgeon: Grace Isaac, MD;  Location: [place];  Service: Thoracic;  Laterality: Left;  . ORIF TIBIA PLATEAU Left 07/05/2019   Procedure: OPEN REDUCTION INTERNAL FIXATION (ORIF) TIBIAL PLATEAU;  Surgeon: Shona Needles, MD;  Location: [place];  Service: Orthopedics;  Laterality: Left;  . RIGHT OOPHORECTOMY    . TONSILLECTOMY AND ADENOIDECTOMY    . VIDEO ASSISTED THORACOSCOPY (VATS)/WEDGE RESECTION Left 09/30/2017   Procedure: VIDEO ASSISTED THORACOSCOPY (VATS)/LUNG RESECTION;  Surgeon: Grace Isaac, MD;  Location: [place];  Service: Thoracic;  Laterality: Left;  [name] VIDEO BRONCHOSCOPY N/A 09/30/2017   Procedure: VIDEO BRONCHOSCOPY;  Surgeon: Grace Isaac, MD;  Location: [facility];  Service: Thoracic;  Laterality: N/A;  . WISDOM TOOTH EXTRACTION      There were no vitals filed for this visit.   Subjective Assessment - 08/24/19 1108    Subjective  January 9th was unloading dishwasher and slipped resulting in left tibial plateau fracture.  Had ORIF on 07/05/19  then home.  Had some HHPT for a short time.  NWB for 6 weeks.  Now WBAT with walker for 3 weeks until next MD appt.    Pertinent History  healthy;  goes by "[employer]"    Limitations  Walking    How long can you walk comfortably?  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  Yes    Pain Score  0-No pain    Pain Location  Knee    Pain Orientation  Left    Aggravating Factors   too much walking will make it sore    Pain Relieving Factors  ice         [facility] PT Assessment - 08/24/19 0001      Assessment   Medical Diagnosis  left tibial plateau fracture     Referring Provider (PT)  Dr. Doreatha Martin     Onset Date/Surgical Date  07/03/19    Next MD Visit  09/07/19    Prior Therapy  none      Precautions   Precaution Comments  WBAT, use walker until 09/07/19  Restrictions   Weight Bearing Restrictions  No      Balance Screen   Has the patient fallen in the past 6 months  No    Has the patient had a decrease in activity level because of a fear of falling?   No    Is the patient reluctant to leave their home because of a fear of falling?   No      Home Film/video editor residence    Living Arrangements  Spouse/significant other    Home Access  Stairs to enter    Entrance Stairs-Number of Steps  [place]  Two level    [place] - 2 wheels      Prior Function   Level of Farmer with basic ADLs    Vocation  Part time employment    Education officer, environmental     Leisure  yard work       Observation/Other Assessments   Focus on Therapeutic Outcomes (FOTO)   53% limitation       Observation/Other Assessments-Edema    Edema  Circumferential      Circumferential Edema   Circumferential - Right   33cm (6cm below tubercle); mid pat 33 1/2; 6cm above pat 34 cm     Circumferential - Left   34 cm; 38 cm, 33 cm       AROM   Overall AROM Comments  hip and ankle WFLS     Right Knee Extension  0    Right Knee Flexion  140    Left Knee Extension  0    Left Knee Flexion  87      Strength   Overall Strength Comments  Able to do SLR with brace on with quad lag     Right Hip Flexion  5/5    Right Hip Extension  5/5    Right Hip ABduction  5/5    Left Hip Flexion  4/5    Left Hip ABduction  4-/5    Left Knee Flexion  3/5    Left Knee Extension  3/5      Ambulation/Gait   Ambulation/Gait  Yes    Assistive device  Rolling walker    Gait Pattern  Step-to pattern    Stairs  Yes    Stairs Assistance  3: Mod assist    Stairs Assistance Details (indicate cue type and reason)  needs railing/walker    Stair Management Technique  Step to pattern    Pre-Gait Activities  tends to internally rotate left foot     Gait Comments  wearing Bledsoe brace                 Objective measurements completed on examination: See above findings.              PT Education - 08/24/19 1203    Education Details  Access Code: [email or web address]   quad sets; sidelying hip abduction; standing retro step;  stair climbing Up with the good, down with the bad    Person(s) Educated  Patient    Methods  Demonstration;Explanation;Handout    Comprehension  Verbalized understanding;Returned demonstration       PT Short Term Goals - 08/24/19 1756      PT SHORT TERM GOAL #1   Title  The patient will demonstrate knowlege of basic self care and ex's to promote ROM  and healing    Time  4    Period  Weeks    Status  New    Target Date  09/21/19      PT SHORT TERM GOAL #2   Title  Left knee flexion to 100 degrees needed for greater ease in/out of the car and with sit to stand    Time  4    Period  Weeks    Status  New      PT SHORT TERM GOAL #3   Title  The patient will have decreased knee edema with  patella circumferential measurement decreased to 36 cm    Time  4    Period  Weeks    Status  New      PT SHORT TERM GOAL #4   Title  The patient will be able to ambulate > 300 feet for short to medium community distances    Time  4    Period  Weeks    Status  New      PT SHORT TERM GOAL #5   Title  The patient will be able to ascend/descend steps one at a time independently    Time  4    Period  Weeks    Status  New        PT Long Term Goals - 08/24/19 1759      PT LONG TERM GOAL #1   Title  The patient will be independent with a safe self progession of HEP    Time  8    Period  Weeks    Status  New    Target Date  09/21/19      PT LONG TERM GOAL #2   Title  The patient will have left knee ROM 0-125 degrees for greater ease ascending and descending stairs    Time  8    Period  Weeks    Status  New      PT LONG TERM GOAL #3   Title  The patient will be able to ambulate with a cane for medium community distances 500 feet needed for work with pain level 3/10 or less    Time  8    Period  Weeks    Status  New      PT LONG TERM GOAL #4   Title  The patient will have 4/5 left knee strength needed for ascending and descending stairs    Time  8    Period  Weeks    Status  New      PT LONG TERM GOAL #5   Title  FOTO functional outcome score improved from 53% limitation to 30% indicating improved function with less pain    Time  8    Period  Weeks    Status  New             Plan - 08/24/19 1203    Clinical Impression Statement  The patient slipped while loading the dishwasher on Jan 9th, 2021 sustaining a left tibial plateau fracture.  She underwent surgical fixation 2 days later.  She remained NWB for 6 weeks secondary to delayed healing.  She is now WBAT but is to maintain on the (rolling) walker until her next MD appt 09/07/19.  She is in an unlocked Bledsoe brace.  Her current knee ROM is 0-87 degrees actively in supine.  She has moderate peripatellar swelling  and quad muscle atrophy per circumferential measurements.  Left hip grossly 4/5.  Left knee 3-/5.  Quad lag with SLR.  She is having difficulty negotiating the stairs at home and instructed in "one at a time" method with her husband's assistance.  She is unable to drive.  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  She would benefit from PT to address these deficits.    Examination-Activity Limitations  Locomotion Level;Bathing;Carry;Stairs;Lift;Stand    Examination-Participation Restrictions  Community Activity;Driving;Yard Work;Other;Shop    Stability/Clinical Decision Making  Stable/Uncomplicated    Clinical Decision Making  Low    Rehab Potential  Good    PT Frequency  2x / week    PT Duration  8 weeks    PT Treatment/Interventions  ADLs/Self Care Home Management;Aquatic Therapy;Cryotherapy;Electrical Stimulation;Neuromuscular re-education;Therapeutic exercise;Therapeutic activities;Stair training;Gait training;Patient/family education;Manual techniques;Dry needling;Taping;Vasopneumatic Device    PT Next Visit Plan  low level ROM left knee; quad muscle activation low level; vasocompression for edema control    PT Home Exercise Plan  Access Code: [email or web address]    Recommended Other Services  per MD:  WBAT but use RW until next appt 3/16    Consulted and Agree with Plan of Care  Patient       Patient will benefit from skilled therapeutic intervention in order to improve the following deficits and impairments:  Abnormal gait, Decreased range of motion, Difficulty walking, Pain, Decreased activity tolerance, Impaired perceived functional ability, Increased edema, Decreased strength  Visit Diagnosis: Acute pain of left knee - Plan: PT plan of care cert/re-cert  Muscle weakness (generalized) - Plan: PT plan of care cert/re-cert  Stiffness of left knee, not elsewhere classified - Plan: PT plan of care cert/re-cert  Localized  edema - Plan: PT plan of care cert/re-cert     Problem List Patient Active Problem List   Diagnosis Date Noted  . Fracture of tibial shaft, left, closed 07/05/2019  . Acute lateral meniscus tear of left knee 07/05/2019  . Tibial plateau fracture, left 07/05/2019  . Closed bicondylar fracture of left tibial plateau 07/04/2019  . Encounter for antineoplastic chemotherapy 10/27/2017  . 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 Goals of care, counseling/discussion 10/11/2017  . Encounter for smoking cessation counseling 10/11/2017  . S/P lobectomy of lung 09/30/2017   Ruben Im, PT 08/24/19 6:05 PM Phone: [phone] Fax: [phone] Alvera Singh 08/24/2019, 6:05 PM  [facility] [address] [place], [place], [address] Phone: [phone]   Fax:  [phone]  Name: Brandi Crosby MRN: [id number] Date of Birth: 08-04-1955

## 2019-08-24 NOTE — Patient Instructions (Signed)
Access Code: [email or web address]  URL: [email or web address]  Date: 08/24/2019  Prepared by: Ruben Im   Exercises Supine Quad Set - 10 reps - 1 sets - 5 hold - 1x daily - 7x weekly Sidelying Hip Abduction - 10 reps - 2 sets - 1x daily - 7x weekly Backward Weight Shift and Opposite Arm Raise with Walker - 10 reps - 1 sets - 1x daily - 7x weekly

## 2019-08-26 ENCOUNTER — Inpatient Hospital Stay: Payer: 59 | Attending: Internal Medicine

## 2019-08-26 ENCOUNTER — Ambulatory Visit (HOSPITAL_COMMUNITY)
Admission: RE | Admit: 2019-08-26 | Discharge: 2019-08-26 | Disposition: A | Discharge status: Home / Self Care | Payer: 59 | Source: Ambulatory Visit | Attending: Internal Medicine | Admitting: Internal Medicine

## 2019-08-26 ENCOUNTER — Other Ambulatory Visit: Payer: Self-pay

## 2019-08-26 DIAGNOSIS — Z85118 Personal history of other malignant neoplasm of bronchus and lung: Secondary | ICD-10-CM | POA: Insufficient documentation

## 2019-08-26 DIAGNOSIS — Z9221 Personal history of antineoplastic chemotherapy: Secondary | ICD-10-CM | POA: Diagnosis present

## 2019-08-26 DIAGNOSIS — C3432 Malignant neoplasm of lower lobe, left bronchus or lung: Secondary | ICD-10-CM

## 2019-08-26 LAB — CMP (CANCER CENTER ONLY)
ALT: 15 U/L (ref 0–44)
AST: 17 U/L (ref 15–41)
Albumin: 4.1 g/dL (ref 3.5–5.0)
Alkaline Phosphatase: 84 U/L (ref 38–126)
Anion gap: 10 (ref 5–15)
BUN: 10 mg/dL (ref 8–23)
CO2: 26 mmol/L (ref 22–32)
Calcium: 9.6 mg/dL (ref 8.9–10.3)
Chloride: 106 mmol/L (ref 98–111)
Creatinine: 0.84 mg/dL (ref 0.44–1.00)
GFR, Est AFR Am: >60 mL/min (ref >60)
GFR, Estimated: >60 mL/min (ref >60)
Glucose, Bld: 88 mg/dL (ref 70–99)
Potassium: 4.5 mmol/L (ref 3.5–5.1)
Sodium: 142 mmol/L (ref 135–145)
Total Bilirubin: 0.3 mg/dL (ref 0.3–1.2)
Total Protein: 7.1 g/dL (ref 6.5–8.1)

## 2019-08-26 LAB — CBC WITH DIFFERENTIAL (CANCER CENTER ONLY)
Abs Immature Granulocytes: 0.01 10*3/uL (ref 0.00–0.07)
Basophils Absolute: 0 10*3/uL (ref 0.0–0.1)
Basophils Relative: 1 %
Eosinophils Absolute: 0.2 10*3/uL (ref 0.0–0.5)
Eosinophils Relative: 4 %
HCT: 39.5 % (ref 36.0–46.0)
Hemoglobin: 12.9 g/dL (ref 12.0–15.0)
Immature Granulocytes: 0 %
Lymphocytes Relative: 32 %
Lymphs Abs: 1.6 10*3/uL (ref 0.7–4.0)
MCH: 30.4 pg (ref 26.0–34.0)
MCHC: 32.7 g/dL (ref 30.0–36.0)
MCV: 93.2 fL (ref 80.0–100.0)
Monocytes Absolute: 0.4 10*3/uL (ref 0.1–1.0)
Monocytes Relative: 9 %
Neutro Abs: 2.6 10*3/uL (ref 1.7–7.7)
Neutrophils Relative %: 54 %
Platelet Count: 263 10*3/uL (ref 150–400)
RBC: 4.24 MIL/uL (ref 3.87–5.11)
RDW: 12.7 % (ref 11.5–15.5)
WBC Count: 4.8 10*3/uL (ref 4.0–10.5)
nRBC: 0 % (ref 0.0–0.2)

## 2019-08-26 MED ORDER — IOHEXOL 300 MG/ML  SOLN
75.0000 mL | Freq: Once | INTRAMUSCULAR | Status: AC | PRN
  Administered 2019-08-26: 75 mL via INTRAVENOUS

## 2019-08-26 MED ORDER — SODIUM CHLORIDE (PF) 0.9 % IJ SOLN
INTRAMUSCULAR | Status: AC
  Filled 2019-08-26: qty 50

## 2019-08-27 ENCOUNTER — Ambulatory Visit: Payer: 59 | Admitting: Physical Therapy

## 2019-08-27 DIAGNOSIS — M25562 Pain in left knee: Secondary | ICD-10-CM | POA: Diagnosis not present

## 2019-08-27 DIAGNOSIS — R600 Localized edema: Secondary | ICD-10-CM

## 2019-08-27 DIAGNOSIS — M25662 Stiffness of left knee, not elsewhere classified: Secondary | ICD-10-CM

## 2019-08-27 DIAGNOSIS — M6281 Muscle weakness (generalized): Secondary | ICD-10-CM

## 2019-08-27 NOTE — Patient Instructions (Signed)
Access Code: [email or web address]  URL: [email or web address]  Date: 08/27/2019  Prepared by: Ruben Im   Exercises Supine Quad Set - 10 reps - 1 sets - 5 hold - 1x daily - 7x weekly Hooklying Sequential Leg March and Lower - 10 reps - 1 sets - 1x daily - 7x weekly Sidelying Hip Abduction - 10 reps - 2 sets - 1x daily - 7x weekly Seated Hip Abduction - 10 reps - 2 sets - 1x daily - 7x weekly Backward Weight Shift and Opposite Arm Raise with Walker - 10 reps - 1 sets - 1x daily - 7x weekly Standing Heel Raise with Support - 10 reps - 2 sets - 1x daily - 7x weekly Standing Weight Shift - 10 reps - 2 sets - 1x daily - 7x weekly [employer] [place] - 10 reps - 2 sets - 1x daily - 7x weekly

## 2019-08-27 NOTE — Therapy (Signed)
[facility] [address], [place], [place], [address] Phone: [phone]   Fax:  [phone]  Physical Therapy Treatment  Patient Details  Name: Brandi Crosby MRN: [id number] Date of Birth: 12-May-1956 Referring Provider (PT): Dr. Doreatha Martin    Encounter Date: 08/27/2019  PT End of Session - 08/27/19 1111    Visit Number  2    Number of Visits  60    Date for PT Re-Evaluation  10/19/19    Authorization Type  UHC    PT Start Time  1025    PT Stop Time  1105    PT Time Calculation (min)  50 min    Activity Tolerance  Patient tolerated treatment well       Past Medical History:  Diagnosis Date  . Allergy   . Anxiety   . Depression   . Hyperlipemia   . Lung mass   . SCL ca dx'd 08/2017  . Shingles     Past Surgical History:  Procedure Laterality Date  . DENTAL SURGERY  2010   implant  . NODE DISSECTION Left 09/30/2017   Procedure: NODE DISSECTION;  Surgeon: Grace Isaac, MD;  Location: [place];  Service: Thoracic;  Laterality: Left;  . ORIF TIBIA PLATEAU Left 07/05/2019   Procedure: OPEN REDUCTION INTERNAL FIXATION (ORIF) TIBIAL PLATEAU;  Surgeon: Shona Needles, MD;  Location: [place];  Service: Orthopedics;  Laterality: Left;  . RIGHT OOPHORECTOMY    . TONSILLECTOMY AND ADENOIDECTOMY    . VIDEO ASSISTED THORACOSCOPY (VATS)/WEDGE RESECTION Left 09/30/2017   Procedure: VIDEO ASSISTED THORACOSCOPY (VATS)/LUNG RESECTION;  Surgeon: Grace Isaac, MD;  Location: [place];  Service: Thoracic;  Laterality: Left;  [name] VIDEO BRONCHOSCOPY N/A 09/30/2017   Procedure: VIDEO BRONCHOSCOPY;  Surgeon: Grace Isaac, MD;  Location: [facility];  Service: Thoracic;  Laterality: N/A;  . WISDOM TOOTH EXTRACTION      There were no vitals filed for this visit.  Subjective Assessment - 08/27/19 1016    Subjective  I did fine last time.  Stairs are difficult with the walker.  My husband has to help with the walker.    Pertinent  History  healthy;  goes by "[name]"    Currently in Pain?  No/denies   Soreness   Pain Score  0-No pain    Pain Location  Knee    Pain Orientation  Left                       [facility] Adult PT Treatment/Exercise - 08/27/19 0001      Knee/Hip Exercises: Aerobic   Nustep  L1 6 min seat 9 UE/LE       Knee/Hip Exercises: Standing   Heel Raises  Both;15 reps    Hip Abduction  AROM;Left;10 reps    Hip Extension  AROM;Left;10 reps    Functional Squat Limitations  hip hinge 10x     SLS with Vectors  WB on left with UE railing support with right step taps 5x     Other Standing Knee Exercises  retro stepping 2x 1 minute     Other Standing Knee Exercises  weight shift side to side; church pew sways 1 min each       Knee/Hip Exercises: Seated   Ball Squeeze  10x    Clamshell with TheraBand  Blue   20x   Other Seated Knee/Hip Exercises  floor slider 25x    Other Seated Knee/Hip  Exercises  seated quad sets 5x 5 sec hold       Knee/Hip Exercises: Supine   Other Supine Knee/Hip Exercises  abdominal knee lifts/lowers 10x    Other Supine Knee/Hip Exercises  seated blue band UE pull downs for abdominal activation       Vasopneumatic   Number Minutes Vasopneumatic   10 minutes    Vasopnuematic Location   Knee    Vasopneumatic Pressure  Medium    Vasopneumatic Temperature   coldest setting              PT Education - 08/27/19 1057    Education Details  Access Code: [email or web address]  supine abdominals; seated clam; standing heel raises; standing shift;  church pew sways    Person(s) Educated  Patient    Methods  Explanation;Demonstration;Handout    Comprehension  Returned demonstration;Verbalized understanding       PT Short Term Goals - 08/24/19 1756      PT SHORT TERM GOAL #1   Title  The patient will demonstrate knowlege of basic self care and ex's to promote ROM and healing    Time  4    Period  Weeks    Status  New    Target Date  09/21/19      PT SHORT TERM GOAL #2    Title  Left knee flexion to 100 degrees needed for greater ease in/out of the car and with sit to stand    Time  4    Period  Weeks    Status  New      PT SHORT TERM GOAL #3   Title  The patient will have decreased knee edema with patella circumferential measurement decreased to 36 cm    Time  4    Period  Weeks    Status  New      PT SHORT TERM GOAL #4   Title  The patient will be able to ambulate > 300 feet for short to medium community distances    Time  4    Period  Weeks    Status  New      PT SHORT TERM GOAL #5   Title  The patient will be able to ascend/descend steps one at a time independently    Time  4    Period  Weeks    Status  New        PT Long Term Goals - 08/24/19 1759      PT LONG TERM GOAL #1   Title  The patient will be independent with a safe self progession of HEP    Time  8    Period  Weeks    Status  New    Target Date  09/21/19      PT LONG TERM GOAL #2   Title  The patient will have left knee ROM 0-125 degrees for greater ease ascending and descending stairs    Time  8    Period  Weeks    Status  New      PT LONG TERM GOAL #3   Title  The patient will be able to ambulate with a cane for medium community distances 500 feet needed for work with pain level 3/10 or less    Time  8    Period  Weeks    Status  New      PT LONG TERM GOAL #4   Title  The patient will have 4/5 left knee strength  needed for ascending and descending stairs    Time  8    Period  Weeks    Status  New      PT LONG TERM GOAL #5   Title  FOTO functional outcome score improved from 53% limitation to 30% indicating improved function with less pain    Time  8    Period  Weeks    Status  New            Plan - 08/27/19 1051    Clinical Impression Statement  The patient is able to perform low level ROM and partial weight bearing exercises with ease.  Therapist closely monitoring to ensure patient is using some UE support in compliance with MD orders.  She  denies pain, just muscular soreness.  Mild edema noted upon arrival much improved following vasocompression.  Therapist monitoring response with all treatment interventions.    Examination-Activity Limitations  Locomotion Level;Bathing;Carry;Stairs;Lift;Stand    Rehab Potential  Good    PT Frequency  2x / week    PT Duration  8 weeks    PT Treatment/Interventions  ADLs/Self Care Home Management;Aquatic Therapy;Cryotherapy;Electrical Stimulation;Neuromuscular re-education;Therapeutic exercise;Therapeutic activities;Stair training;Gait training;Patient/family education;Manual techniques;Dry needling;Taping;Vasopneumatic Device    PT Next Visit Plan  low level ROM left knee; quad muscle activation low level; vasocompression for edema control;  WBAT but walker full time until 3/16 MD appt    PT Home Exercise Plan  Access Code: [email or web address]       Patient will benefit from skilled therapeutic intervention in order to improve the following deficits and impairments:  Abnormal gait, Decreased range of motion, Difficulty walking, Pain, Decreased activity tolerance, Impaired perceived functional ability, Increased edema, Decreased strength  Visit Diagnosis: Acute pain of left knee  Muscle weakness (generalized)  Stiffness of left knee, not elsewhere classified  Localized edema     Problem List Patient Active Problem List   Diagnosis Date Noted  . Fracture of tibial shaft, left, closed 07/05/2019  . Acute lateral meniscus tear of left knee 07/05/2019  . Tibial plateau fracture, left 07/05/2019  . Closed bicondylar fracture of left tibial plateau 07/04/2019  . Encounter for antineoplastic chemotherapy 10/27/2017  . 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 Goals of care, counseling/discussion 10/11/2017  . Encounter for smoking cessation counseling 10/11/2017  . S/P lobectomy of lung 09/30/2017   Ruben Im, PT 08/27/19 11:32 AM Phone: [phone] Fax:  [phone] Alvera Singh 08/27/2019, 11:32 AM  [facility] [address], [place], [place], [address] Phone: [phone]   Fax:  [phone]  Name: Brandi Crosby MRN: [id number] Date of Birth: August 08, 1955

## 2019-08-31 ENCOUNTER — Other Ambulatory Visit: Payer: Self-pay

## 2019-08-31 ENCOUNTER — Ambulatory Visit: Payer: 59 | Admitting: Physical Therapy

## 2019-08-31 DIAGNOSIS — M6281 Muscle weakness (generalized): Secondary | ICD-10-CM

## 2019-08-31 DIAGNOSIS — R600 Localized edema: Secondary | ICD-10-CM

## 2019-08-31 DIAGNOSIS — M25562 Pain in left knee: Secondary | ICD-10-CM | POA: Diagnosis not present

## 2019-08-31 DIAGNOSIS — M25662 Stiffness of left knee, not elsewhere classified: Secondary | ICD-10-CM

## 2019-08-31 NOTE — Therapy (Signed)
[facility] [address], [place], [place], [address] Phone: [phone]   Fax:  [phone]  Physical Therapy Treatment  Patient Details  Name: Brandi Crosby MRN: [id number] Date of Birth: Feb 20, 1956 Referring Provider (PT): Dr. Doreatha Martin    Encounter Date: 08/31/2019  PT End of Session - 08/31/19 1137    Visit Number  3    Number of Visits  60    Date for PT Re-Evaluation  10/19/19    Authorization Type  UHC    PT Start Time  1100    PT Stop Time  1145    PT Time Calculation (min)  45 min    Activity Tolerance  Patient tolerated treatment well       Past Medical History:  Diagnosis Date  . Allergy   . Anxiety   . Depression   . Hyperlipemia   . Lung mass   . SCL ca dx'd 08/2017  . Shingles     Past Surgical History:  Procedure Laterality Date  . DENTAL SURGERY  2010   implant  . NODE DISSECTION Left 09/30/2017   Procedure: NODE DISSECTION;  Surgeon: Grace Isaac, MD;  Location: [place];  Service: Thoracic;  Laterality: Left;  . ORIF TIBIA PLATEAU Left 07/05/2019   Procedure: OPEN REDUCTION INTERNAL FIXATION (ORIF) TIBIAL PLATEAU;  Surgeon: Shona Needles, MD;  Location: [place];  Service: Orthopedics;  Laterality: Left;  . RIGHT OOPHORECTOMY    . TONSILLECTOMY AND ADENOIDECTOMY    . VIDEO ASSISTED THORACOSCOPY (VATS)/WEDGE RESECTION Left 09/30/2017   Procedure: VIDEO ASSISTED THORACOSCOPY (VATS)/LUNG RESECTION;  Surgeon: Grace Isaac, MD;  Location: [place];  Service: Thoracic;  Laterality: Left;  [name] VIDEO BRONCHOSCOPY N/A 09/30/2017   Procedure: VIDEO BRONCHOSCOPY;  Surgeon: Grace Isaac, MD;  Location: [facility] OR;  Service: Thoracic;  Laterality: N/A;  . WISDOM TOOTH EXTRACTION      There were no vitals filed for this visit.  Subjective Assessment - 08/31/19 1103    Subjective  I walk a lot (with my walker).  Only mild soreness.  Did fine after last time.    Pertinent History  healthy;  goes  by "Brandi Crosby"    Currently in Pain?  No/denies    Pain Score  0-No pain    Pain Location  Knee    Pain Orientation  Left                       [facility] Adult PT Treatment/Exercise - 08/31/19 0001      Knee/Hip Exercises: Aerobic   Nustep  L1 8 min seat 9 UE/LE    while discussing status/progress     Knee/Hip Exercises: Standing   Heel Raises  Both;15 reps    Hip Abduction  AROM;Right;Left;5 reps    Hip Extension  AROM;Right;Left;5 reps    Forward Step Up  Left;5 reps;Hand Hold: 2;Step Height: 2"    Functional Squat Limitations  minisquat 10x     Rocker Board  1 minute    Gait Training  step taps 5x right/left     Other Standing Knee Exercises  retro stepping x 1 minute     Other Standing Knee Exercises  church pew sways 1 minute       Knee/Hip Exercises: Seated   Other Seated Knee/Hip Exercises  floor slider 25x      Vasopneumatic   Number Minutes Vasopneumatic   10 minutes    Vasopnuematic Location  Knee    Vasopneumatic Pressure  Medium    Vasopneumatic Temperature   coldest setting                PT Short Term Goals - 08/24/19 1756      PT SHORT TERM GOAL #1   Title  The patient will demonstrate knowlege of basic self care and ex's to promote ROM and healing    Time  4    Period  Weeks    Status  New    Target Date  09/21/19      PT SHORT TERM GOAL #2   Title  Left knee flexion to 100 degrees needed for greater ease in/out of the car and with sit to stand    Time  4    Period  Weeks    Status  New      PT SHORT TERM GOAL #3   Title  The patient will have decreased knee edema with patella circumferential measurement decreased to 36 cm    Time  4    Period  Weeks    Status  New      PT SHORT TERM GOAL #4   Title  The patient will be able to ambulate > 300 feet for short to medium community distances    Time  4    Period  Weeks    Status  New      PT SHORT TERM GOAL #5   Title  The patient will be able to ascend/descend steps one at a  time independently    Time  4    Period  Weeks    Status  New        PT Long Term Goals - 08/24/19 1759      PT LONG TERM GOAL #1   Title  The patient will be independent with a safe self progession of HEP    Time  8    Period  Weeks    Status  New    Target Date  09/21/19      PT LONG TERM GOAL #2   Title  The patient will have left knee ROM 0-125 degrees for greater ease ascending and descending stairs    Time  8    Period  Weeks    Status  New      PT LONG TERM GOAL #3   Title  The patient will be able to ambulate with a cane for medium community distances 500 feet needed for work with pain level 3/10 or less    Time  8    Period  Weeks    Status  New      PT LONG TERM GOAL #4   Title  The patient will have 4/5 left knee strength needed for ascending and descending stairs    Time  8    Period  Weeks    Status  New      PT LONG TERM GOAL #5   Title  FOTO functional outcome score improved from 53% limitation to 30% indicating improved function with less pain    Time  8    Period  Weeks    Status  New            Plan - 08/31/19 1137    Clinical Impression Statement  The patient is ambulating with good heel toe pattern with RW and brace on.  Improving quad muscle activation and tolerance for weight bearing with UE's supporting.  Decreasing edema after vasocompression and overall.  Therapist closely monitoring response with all.  Mild soreness reported but no pain.    Examination-Activity Limitations  Locomotion Level;Bathing;Carry;Stairs;Lift;Stand    Examination-Participation Restrictions  Community Activity;Driving;Yard Work;Other;Shop    Stability/Clinical Decision Making  Stable/Uncomplicated    Rehab Potential  Good    PT Frequency  2x / week    PT Duration  8 weeks    PT Treatment/Interventions  ADLs/Self Care Home Management;Aquatic Therapy;Cryotherapy;Electrical Stimulation;Neuromuscular re-education;Therapeutic exercise;Therapeutic activities;Stair  training;Gait training;Patient/family education;Manual techniques;Dry needling;Taping;Vasopneumatic Device    PT Next Visit Plan  recheck ROM; low level ROM left knee; quad muscle activation low level; vasocompression for edema control;  WBAT but walker full time until 3/16 MD appt    PT Home Exercise Plan  Access Code: [email or web address]       Patient will benefit from skilled therapeutic intervention in order to improve the following deficits and impairments:  Abnormal gait, Decreased range of motion, Difficulty walking, Pain, Decreased activity tolerance, Impaired perceived functional ability, Increased edema, Decreased strength  Visit Diagnosis: Acute pain of left knee  Muscle weakness (generalized)  Stiffness of left knee, not elsewhere classified  Localized edema     Problem List Patient Active Problem List   Diagnosis Date Noted  . Fracture of tibial shaft, left, closed 07/05/2019  . Acute lateral meniscus tear of left knee 07/05/2019  . Tibial plateau fracture, left 07/05/2019  . Closed bicondylar fracture of left tibial plateau 07/04/2019  . Encounter for antineoplastic chemotherapy 10/27/2017  . 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 Goals of care, counseling/discussion 10/11/2017  . Encounter for smoking cessation counseling 10/11/2017  . S/P lobectomy of lung 09/30/2017   Ruben Im, PT 08/31/19 11:41 AM Phone: [phone] Fax: [phone]  Alvera Singh 08/31/2019, 11:41 AM  [facility] [address] [place], [place], [address] Phone: [phone]   Fax:  [phone]  Name: Beaulah Romanek MRN: [id number] Date of Birth: [birth year]

## 2019-09-01 ENCOUNTER — Encounter: Payer: Self-pay | Admitting: Internal Medicine

## 2019-09-01 ENCOUNTER — Other Ambulatory Visit: Payer: Self-pay

## 2019-09-01 ENCOUNTER — Inpatient Hospital Stay: Payer: 59 | Admitting: Internal Medicine

## 2019-09-01 VITALS — BP 112/70 | HR 79 | Temp 98.0°F | Resp 18 | Ht 65.0 in | Wt 133.8 lb

## 2019-09-01 DIAGNOSIS — C3432 Malignant neoplasm of lower lobe, left bronchus or lung: Secondary | ICD-10-CM

## 2019-09-01 DIAGNOSIS — Z85118 Personal history of other malignant neoplasm of bronchus and lung: Secondary | ICD-10-CM | POA: Diagnosis not present

## 2019-09-01 NOTE — Progress Notes (Signed)
[facility] [phone]   [phone]  OFFICE PROGRESS NOTE  Aurea Graff, PA-C [address] [place] [address]  DIAGNOSIS: Limited stage (T2 a, N0, M0) of small cell lung cancer presented with left lower lobe lung mass status post left lower lobectomy with lymph node dissection on September 30, 2017.  PRIOR THERAPY:  1) status post left lower lobectomy with lymph node dissection on September 30, 2017. 2) Adjuvant systemic chemotherapy with 4 cycles of cisplatin 80 mg/M2 on day 1 and etoposide 100 mg/M2 on days 1, 2 and 3 every 3 weeks.  First dose Nov 03, 2017.  Status post 4 cycles.  Last dose was given January 12, 2018.  CURRENT THERAPY: Observation.  INTERVAL HISTORY: Brandi Crosby 64 y.o. female returns to the clinic today for follow-up visit accompanied by her husband.  The patient is feeling fine today with no concerning complaints.  She denied having any chest pain, shortness of breath, cough or hemoptysis.  She denied having any recent weight loss or night sweats.  She has no nausea, vomiting, diarrhea or constipation.  She has no significant fever or chills.  The patient had repeat CT scan of the chest performed recently and she is here for evaluation and discussion of her risk her results.   MEDICAL HISTORY: Past Medical History:  Diagnosis Date  . Allergy   . Anxiety   . Depression   . Hyperlipemia   . Lung mass   . SCL ca dx'd 08/2017  . Shingles     ALLERGIES:  is allergic to augmentin [amoxicillin-pot clavulanate] and penicillins.  MEDICATIONS:  Current Outpatient Medications  Medication Sig Dispense Refill  . ALPRAZolam (XANAX) 0.5 MG tablet Take 0.5 mg by mouth at bedtime as needed for anxiety or sleep.     . Ascorbic Acid (VITAMIN C) 1000 MG tablet Take 1,000 mg by mouth daily.      . cholecalciferol (VITAMIN D) 1000 units tablet Take 1,000 Units by mouth daily.    [name] docusate sodium (COLACE) 100 MG capsule Take 1  capsule (100 mg total) by mouth 2 (two) times daily. (Patient not taking: Reported on 08/24/2019) 10 capsule 0  . escitalopram (LEXAPRO) 10 MG tablet Take 10 mg by mouth daily.     [name] gabapentin (NEURONTIN) 100 MG capsule Take 1 capsule (100 mg total) by mouth 3 (three) times daily. (Patient not taking: Reported on 08/24/2019) 21 capsule 0  . methocarbamol (ROBAXIN) 500 MG tablet Take 1 tablet (500 mg total) by mouth every 6 (six) hours as needed for muscle spasms. (Patient not taking: Reported on 08/24/2019) 28 tablet 0   No current facility-administered medications for this visit.    SURGICAL HISTORY:  Past Surgical History:  Procedure Laterality Date  . DENTAL SURGERY  2010   implant  . NODE DISSECTION Left 09/30/2017   Procedure: NODE DISSECTION;  Surgeon: Grace Isaac, MD;  Location: [place];  Service: Thoracic;  Laterality: Left;  . ORIF TIBIA PLATEAU Left 07/05/2019   Procedure: OPEN REDUCTION INTERNAL FIXATION (ORIF) TIBIAL PLATEAU;  Surgeon: Shona Needles, MD;  Location: [place];  Service: Orthopedics;  Laterality: Left;  . RIGHT OOPHORECTOMY    . TONSILLECTOMY AND ADENOIDECTOMY    . VIDEO ASSISTED THORACOSCOPY (VATS)/WEDGE RESECTION Left 09/30/2017   Procedure: VIDEO ASSISTED THORACOSCOPY (VATS)/LUNG RESECTION;  Surgeon: Grace Isaac, MD;  Location: [place];  Service: Thoracic;  Laterality: Left;  .  VIDEO BRONCHOSCOPY N/A 09/30/2017   Procedure: VIDEO BRONCHOSCOPY;  Surgeon: Grace Isaac, MD;  Location: [facility] OR;  Service: Thoracic;  Laterality: N/A;  . WISDOM TOOTH EXTRACTION      REVIEW OF SYSTEMS:  A comprehensive review of systems was negative.   PHYSICAL EXAMINATION: General appearance: alert, cooperative and no distress Head: Normocephalic, without obvious abnormality, atraumatic Neck: no adenopathy, no JVD, supple, symmetrical, trachea midline and thyroid not enlarged, symmetric, no tenderness/mass/nodules Lymph nodes: Cervical, supraclavicular, and axillary nodes  normal. Resp: clear to auscultation bilaterally Back: symmetric, no curvature. ROM normal. No CVA tenderness. Cardio: regular rate and rhythm, S1, S2 normal, no murmur, click, rub or gallop GI: soft, non-tender; bowel sounds normal; no masses,  no organomegaly Extremities: extremities normal, atraumatic, no cyanosis or edema  ECOG PERFORMANCE STATUS: 0 - Asymptomatic  Blood pressure (!) 122/103, pulse 79, temperature 98 F (36.7 C), temperature source Oral, resp. rate 18, height 5\' 5"  (1.651 m), weight 133 lb 12.8 oz (60.7 kg), SpO2 99 %.  LABORATORY DATA: Lab Results  Component Value Date   WBC 4.8 08/26/2019   HGB 12.9 08/26/2019   HCT 39.5 08/26/2019   MCV 93.2 08/26/2019   PLT 263 08/26/2019      Chemistry      Component Value Date/Time   NA 142 08/26/2019 0808   K 4.5 08/26/2019 0808   CL 106 08/26/2019 0808   CO2 26 08/26/2019 0808   BUN 10 08/26/2019 0808   CREATININE 0.84 08/26/2019 0808      Component Value Date/Time   CALCIUM 9.6 08/26/2019 0808   ALKPHOS 84 08/26/2019 0808   AST 17 08/26/2019 0808   ALT 15 08/26/2019 0808   BILITOT 0.3 08/26/2019 0808       RADIOGRAPHIC STUDIES: CT Chest W Contrast  Result Date: 08/26/2019 CLINICAL DATA:  64 year old female with history of small cell lung cancer status post chemotherapy. Follow-up study. EXAM: CT CHEST WITH CONTRAST TECHNIQUE: Multidetector CT imaging of the chest was performed during intravenous contrast administration. CONTRAST:  100mL OMNIPAQUE IOHEXOL 300 MG/ML  SOLN COMPARISON:  Chest CT 02/15/2019. FINDINGS: Cardiovascular: Heart size is normal. There is no significant pericardial fluid, thickening or pericardial calcification. Aortic atherosclerosis. No definite coronary artery calcifications. Mediastinum/Nodes: No pathologically enlarged mediastinal or hilar lymph nodes. Please note that accurate exclusion of hilar adenopathy is limited on noncontrast CT scans. Esophagus is unremarkable in appearance. No  axillary lymphadenopathy. Lungs/Pleura: Status post left lower lobectomy with compensatory hyperexpansion of the left upper lobe. 2 mm right upper lobe pulmonary nodule (axial image 46 of series 5), stable compared to the prior examination, considered definitively benign. No other new suspicious appearing pulmonary nodules or masses are noted. No acute consolidative airspace disease. No pleural effusions. Upper Abdomen: Aortic atherosclerosis. Musculoskeletal: There are no aggressive appearing lytic or blastic lesions noted in the visualized portions of the skeleton. IMPRESSION: 1. No findings to suggest recurrent or metastatic disease in the thorax. 2. Aortic atherosclerosis. Aortic Atherosclerosis (ICD10-I70.0). Electronically Signed   By: Vinnie Langton M.D.   On: 08/26/2019 10:59    ASSESSMENT AND PLAN: This is a very pleasant 64 years old white female recently diagnosed with limited stage small cell lung cancer status post surgical resection.   She completed a course of adjuvant treatment with systemic chemotherapy with cisplatin and etoposide status post 4 cycles.   She is currently on observation and feeling fine. She had repeat CT scan of the chest performed recently.  I personally and  independently reviewed the scans and discussed the results with the patient and her husband. Her scan showed no concerning findings for disease recurrence or metastasis. I recommended for her to continue on observation with repeat CT scan of the chest in 6 months. She was advised to call immediately if she has any concerning symptoms in the interval. The patient voices understanding of current disease status and treatment options and is in agreement with the current care plan.  All questions were answered. The patient knows to call the clinic with any problems, questions or concerns. We can certainly see the patient much sooner if necessary.  Disclaimer: This note was dictated with voice recognition software.  Similar sounding words can inadvertently be transcribed and may not be corrected upon review.

## 2019-09-02 ENCOUNTER — Ambulatory Visit: Payer: 59 | Admitting: Physical Therapy

## 2019-09-02 ENCOUNTER — Telehealth: Payer: Self-pay | Admitting: Internal Medicine

## 2019-09-02 ENCOUNTER — Encounter: Payer: Self-pay | Admitting: Physical Therapy

## 2019-09-02 DIAGNOSIS — R600 Localized edema: Secondary | ICD-10-CM

## 2019-09-02 DIAGNOSIS — M6281 Muscle weakness (generalized): Secondary | ICD-10-CM

## 2019-09-02 DIAGNOSIS — M25562 Pain in left knee: Secondary | ICD-10-CM

## 2019-09-02 DIAGNOSIS — M25662 Stiffness of left knee, not elsewhere classified: Secondary | ICD-10-CM

## 2019-09-02 NOTE — Therapy (Signed)
[facility] [address] [place], [place], [address] Phone: [phone]   Fax:  [phone]  Physical Therapy Treatment  Patient Details  Name: Brandi Crosby MRN: [id number] Date of Birth: February 14, 1956 Referring Provider (PT): Dr. Doreatha Martin    Encounter Date: 09/02/2019  PT End of Session - 09/02/19 2003    Visit Number  4    Number of Visits  60    Date for PT Re-Evaluation  10/19/19    Authorization Type  [employer]    PT Start Time  1100    PT Stop Time  1145    PT Time Calculation (min)  45 min    Activity Tolerance  Patient tolerated treatment well       Past Medical History:  Diagnosis Date  . Allergy   . Anxiety   . Depression   . Hyperlipemia   . Lung mass   . SCL ca dx'd 08/2017  . Shingles     Past Surgical History:  Procedure Laterality Date  . DENTAL SURGERY  2010   implant  . NODE DISSECTION Left 09/30/2017   Procedure: NODE DISSECTION;  Surgeon: Grace Isaac, MD;  Location: [place];  Service: Thoracic;  Laterality: Left;  . ORIF TIBIA PLATEAU Left 07/05/2019   Procedure: OPEN REDUCTION INTERNAL FIXATION (ORIF) TIBIAL PLATEAU;  Surgeon: Shona Needles, MD;  Location: [place];  Service: Orthopedics;  Laterality: Left;  . RIGHT OOPHORECTOMY    . TONSILLECTOMY AND ADENOIDECTOMY    . VIDEO ASSISTED THORACOSCOPY (VATS)/WEDGE RESECTION Left 09/30/2017   Procedure: VIDEO ASSISTED THORACOSCOPY (VATS)/LUNG RESECTION;  Surgeon: Grace Isaac, MD;  Location: [place];  Service: Thoracic;  Laterality: Left;  [name] VIDEO BRONCHOSCOPY N/A 09/30/2017   Procedure: VIDEO BRONCHOSCOPY;  Surgeon: Grace Isaac, MD;  Location: [facility] OR;  Service: Thoracic;  Laterality: N/A;  . WISDOM TOOTH EXTRACTION      There were no vitals filed for this visit.  Subjective Assessment - 09/02/19 1104    Subjective  It's a good sore.  My shoulders are sore from doing a lot at home.    Pertinent History  healthy;  goes by "Brandi Crosby"     Currently in Pain?  No/denies    Pain Score  0-No pain    Pain Orientation  Left         [facility] PT Assessment - 09/02/19 0001      Observation/Other Assessments-Edema    Edema  Circumferential      Circumferential Edema   Circumferential - Left   mid patellar 37 1/2 cm       AROM   Left Knee Extension  0    Left Knee Flexion  98                   [facility] Adult PT Treatment/Exercise - 09/02/19 0001      Knee/Hip Exercises: Aerobic   Nustep  L1/2 10 min seat 9 UE/LE    while discussing status/progress     Knee/Hip Exercises: Standing   Heel Raises  Both;15 reps    Terminal Knee Extension  Strengthening;Left;10 reps    Theraband Level (Terminal Knee Extension)  Level 2 (Red)    Forward Step Up  Left;5 reps;Hand Hold: 2;Step Height: 2"    Functional Squat Limitations  minisquat 10x    with red band around knee   Other Standing Knee Exercises  retro stepping x 1 minute     Other Standing  Knee Exercises  church pew sways 1 minute       Knee/Hip Exercises: Seated   Heel Slides  Strengthening;Left;15 reps   yellow band   Heel Slides Limitations  yellow band    Other Seated Knee/Hip Exercises  floor slider quad with yellow band 15x       Vasopneumatic   Number Minutes Vasopneumatic   10 minutes    Vasopnuematic Location   Knee    Vasopneumatic Pressure  High    Vasopneumatic Temperature   coldest setting                PT Short Term Goals - 08/24/19 1756      PT SHORT TERM GOAL #1   Title  The patient will demonstrate knowlege of basic self care and ex's to promote ROM and healing    Time  4    Period  Weeks    Status  New    Target Date  09/21/19      PT SHORT TERM GOAL #2   Title  Left knee flexion to 100 degrees needed for greater ease in/out of the car and with sit to stand    Time  4    Period  Weeks    Status  New      PT SHORT TERM GOAL #3   Title  The patient will have decreased knee edema with patella circumferential measurement  decreased to 36 cm    Time  4    Period  Weeks    Status  New      PT SHORT TERM GOAL #4   Title  The patient will be able to ambulate > 300 feet for short to medium community distances    Time  4    Period  Weeks    Status  New      PT SHORT TERM GOAL #5   Title  The patient will be able to ascend/descend steps one at a time independently    Time  4    Period  Weeks    Status  New        PT Long Term Goals - 08/24/19 1759      PT LONG TERM GOAL #1   Title  The patient will be independent with a safe self progession of HEP    Time  8    Period  Weeks    Status  New    Target Date  09/21/19      PT LONG TERM GOAL #2   Title  The patient will have left knee ROM 0-125 degrees for greater ease ascending and descending stairs    Time  8    Period  Weeks    Status  New      PT LONG TERM GOAL #3   Title  The patient will be able to ambulate with a cane for medium community distances 500 feet needed for work with pain level 3/10 or less    Time  8    Period  Weeks    Status  New      PT LONG TERM GOAL #4   Title  The patient will have 4/5 left knee strength needed for ascending and descending stairs    Time  8    Period  Weeks    Status  New      PT LONG TERM GOAL #5   Title  FOTO functional outcome score improved from 53% limitation to 30%  indicating improved function with less pain    Time  8    Period  Weeks    Status  New            Plan - 09/02/19 1139    Clinical Impression Statement  Patient has improving knee ROM 0-97 degrees and improving quad muscle activation.  Good control with SLR.  She is adhering to gait with RW and brace as instructed by MD.  Mild anterior knee edema but 1/2 cm less than 2 weeks ago.  Her pain level remains at a lower intensity.  Will await instructions from MD following appt 3/16 to determine if she is ready for further progression based on x-ray findings.    Rehab Potential  Good    PT Frequency  2x / week    PT Duration  8  weeks    PT Treatment/Interventions  ADLs/Self Care Home Management;Aquatic Therapy;Cryotherapy;Electrical Stimulation;Neuromuscular re-education;Therapeutic exercise;Therapeutic activities;Stair training;Gait training;Patient/family education;Manual techniques;Dry needling;Taping;Vasopneumatic Device    PT Next Visit Plan  quad muscle activation low level; vasocompression for edema control;  WBAT but walker full time until 3/16 MD appt    PT Home Exercise Plan  Access Code: [email or web address]       Patient will benefit from skilled therapeutic intervention in order to improve the following deficits and impairments:  Abnormal gait, Decreased range of motion, Difficulty walking, Pain, Decreased activity tolerance, Impaired perceived functional ability, Increased edema, Decreased strength  Visit Diagnosis: Acute pain of left knee  Muscle weakness (generalized)  Stiffness of left knee, not elsewhere classified  Localized edema     Problem List Patient Active Problem List   Diagnosis Date Noted  . Fracture of tibial shaft, left, closed 07/05/2019  . Acute lateral meniscus tear of left knee 07/05/2019  . Tibial plateau fracture, left 07/05/2019  . Closed bicondylar fracture of left tibial plateau 07/04/2019  . Encounter for antineoplastic chemotherapy 10/27/2017  . 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 Goals of care, counseling/discussion 10/11/2017  . Encounter for smoking cessation counseling 10/11/2017  . S/P lobectomy of lung 09/30/2017   Ruben Im, PT 09/02/19 8:10 PM Phone: [phone] Fax: [phone] Alvera Singh 09/02/2019, 8:09 PM  [facility] [address], [place], [place], [address] Phone: [phone]   Fax:  [phone]  Name: Brandi Crosby MRN: [id number] Date of Birth: 26-Dec-1955

## 2019-09-02 NOTE — Telephone Encounter (Signed)
Scheduled per 3/10 los. Called and left msg. Mailed printout

## 2019-09-07 ENCOUNTER — Encounter: Payer: Self-pay | Admitting: Physical Therapy

## 2019-09-07 ENCOUNTER — Other Ambulatory Visit: Payer: Self-pay

## 2019-09-07 ENCOUNTER — Ambulatory Visit: Payer: 59 | Admitting: Physical Therapy

## 2019-09-07 DIAGNOSIS — M25662 Stiffness of left knee, not elsewhere classified: Secondary | ICD-10-CM

## 2019-09-07 DIAGNOSIS — R600 Localized edema: Secondary | ICD-10-CM

## 2019-09-07 DIAGNOSIS — M6281 Muscle weakness (generalized): Secondary | ICD-10-CM

## 2019-09-07 DIAGNOSIS — M25562 Pain in left knee: Secondary | ICD-10-CM | POA: Diagnosis not present

## 2019-09-07 NOTE — Therapy (Signed)
[facility] [address] [place], [place], [address] Phone: [phone]   Fax:  [phone]  Physical Therapy Treatment  Patient Details  Name: Brandi Crosby MRN: [id number] Date of Birth: 04-12-1956 Referring Provider (PT): Dr. Doreatha Martin    Encounter Date: 09/07/2019  PT End of Session - 09/07/19 1147    Visit Number  5    Number of Visits  60    Date for PT Re-Evaluation  10/19/19    Authorization Type  [employer]    PT Start Time  1100    PT Stop Time  1155    PT Time Calculation (min)  55 min    Activity Tolerance  Patient tolerated treatment well       Past Medical History:  Diagnosis Date  . Allergy   . Anxiety   . Depression   . Hyperlipemia   . Lung mass   . SCL ca dx'd 08/2017  . Shingles     Past Surgical History:  Procedure Laterality Date  . DENTAL SURGERY  2010   implant  . NODE DISSECTION Left 09/30/2017   Procedure: NODE DISSECTION;  Surgeon: Grace Isaac, MD;  Location: [place];  Service: Thoracic;  Laterality: Left;  . ORIF TIBIA PLATEAU Left 07/05/2019   Procedure: OPEN REDUCTION INTERNAL FIXATION (ORIF) TIBIAL PLATEAU;  Surgeon: Shona Needles, MD;  Location: [place];  Service: Orthopedics;  Laterality: Left;  . RIGHT OOPHORECTOMY    . TONSILLECTOMY AND ADENOIDECTOMY    . VIDEO ASSISTED THORACOSCOPY (VATS)/WEDGE RESECTION Left 09/30/2017   Procedure: VIDEO ASSISTED THORACOSCOPY (VATS)/LUNG RESECTION;  Surgeon: Grace Isaac, MD;  Location: [place];  Service: Thoracic;  Laterality: Left;  [name] VIDEO BRONCHOSCOPY N/A 09/30/2017   Procedure: VIDEO BRONCHOSCOPY;  Surgeon: Grace Isaac, MD;  Location: [facility] OR;  Service: Thoracic;  Laterality: N/A;  . WISDOM TOOTH EXTRACTION      There were no vitals filed for this visit.  Subjective Assessment - 09/07/19 1101    Subjective  Got a good report from the doctor.  OK to progress to the cane.  Good healing per x-ray.  Presents with cane and  RW.    Pertinent History  healthy;  goes by "[name]"; MD in April    Currently in Pain?  Yes    Pain Score  1     Pain Location  Knee    Pain Orientation  Left                       [facility] Adult PT Treatment/Exercise - 09/07/19 0001      Therapeutic Activites    Therapeutic Activities  ADL's    ADL's  gait with cane; ascending/descending curbs and steps       Knee/Hip Exercises: Aerobic   Recumbent Bike  5 min full revolutions       Knee/Hip Exercises: Machines for Strengthening   Total Gym Leg Press  seat 8 60# bil 15x       Knee/Hip Exercises: Standing   Heel Raises  Both;15 reps    Forward Step Up  Right;2 sets;5 reps;Hand Hold: 2;Step Height: 4"    Gait Training  step taps 10x right/left     Other Standing Knee Exercises  attempted WB on left 3 ways but too dif      Knee/Hip Exercises: Seated   Heel Slides  Strengthening;Left;15 reps   yellow band   Heel Slides Limitations  yellow  band    Other Seated Knee/Hip Exercises  floor slider quad with yellow band 15x       Vasopneumatic   Number Minutes Vasopneumatic   15 minutes    Vasopnuematic Location   Knee    Vasopneumatic Pressure  High    Vasopneumatic Temperature   coldest setting              PT Education - 09/07/19 1147    Education Details  Access Code: [email or web address]  step ups 4 inch counter support    Person(s) Educated  Patient    Methods  Explanation;Demonstration;Handout    Comprehension  Returned demonstration;Verbalized understanding       PT Short Term Goals - 09/07/19 1202      PT SHORT TERM GOAL #1   Title  The patient will demonstrate knowlege of basic self care and ex's to promote ROM and healing    Status  Achieved      PT SHORT TERM GOAL #2   Title  Left knee flexion to 100 degrees needed for greater ease in/out of the car and with sit to stand    Time  4    Period  Weeks    Status  On-going      PT SHORT TERM GOAL #3   Title  The patient will have decreased knee edema  with patella circumferential measurement decreased to 36 cm    Time  4    Period  Weeks    Status  On-going      PT SHORT TERM GOAL #4   Title  The patient will be able to ambulate > 300 feet for short to medium community distances    Status  Achieved      PT SHORT TERM GOAL #5   Title  The patient will be able to ascend/descend steps one at a time independently    Time  4    Period  Weeks    Status  On-going        PT Long Term Goals - 08/24/19 1759      PT LONG TERM GOAL #1   Title  The patient will be independent with a safe self progession of HEP    Time  8    Period  Weeks    Status  New    Target Date  09/21/19      PT LONG TERM GOAL #2   Title  The patient will have left knee ROM 0-125 degrees for greater ease ascending and descending stairs    Time  8    Period  Weeks    Status  New      PT LONG TERM GOAL #3   Title  The patient will be able to ambulate with a cane for medium community distances 500 feet needed for work with pain level 3/10 or less    Time  8    Period  Weeks    Status  New      PT LONG TERM GOAL #4   Title  The patient will have 4/5 left knee strength needed for ascending and descending stairs    Time  8    Period  Weeks    Status  New      PT LONG TERM GOAL #5   Title  FOTO functional outcome score improved from 53% limitation to 30% indicating improved function with less pain    Time  8    Period  Weeks  Status  New            Plan - 09/07/19 1129    Clinical Impression Statement  The patient returns following a good follow up visit with her doctor with reported bone healing and OK to progress to a cane.  The patient was instructed in gait sequence with the cane as well as ascending/descending steps with 1 railing and the cane.  She requires min verbal cues but no physical assist needed.  She is able to perform a progression of ROM and strengthening although she has quick muscle fatigue with standing ex's with visible muscle  quivering in HS and quad muscles.  Good response to vasocompression with decreased edema noted.  Therapist closely monitoring response with all treatment interventions and modifiying ex's as needed.    Examination-Activity Limitations  Locomotion Level;Bathing;Carry;Stairs;Lift;Stand    Examination-Participation Restrictions  Community Activity;Driving;Yard Work;Other;Shop    Rehab Potential  Good    PT Frequency  2x / week    PT Duration  8 weeks    PT Treatment/Interventions  ADLs/Self Care Home Management;Aquatic Therapy;Cryotherapy;Electrical Stimulation;Neuromuscular re-education;Therapeutic exercise;Therapeutic activities;Stair training;Gait training;Patient/family education;Manual techniques;Dry needling;Taping;Vasopneumatic Device    PT Next Visit Plan  quad muscle activation low level; vasocompression for edema control;  see how gait with cane is going;  check knee flexion ROM    PT Home Exercise Plan  Access Code: [email or web address]       Patient will benefit from skilled therapeutic intervention in order to improve the following deficits and impairments:  Abnormal gait, Decreased range of motion, Difficulty walking, Pain, Decreased activity tolerance, Impaired perceived functional ability, Increased edema, Decreased strength  Visit Diagnosis: Acute pain of left knee  Muscle weakness (generalized)  Stiffness of left knee, not elsewhere classified  Localized edema     Problem List Patient Active Problem List   Diagnosis Date Noted  . Fracture of tibial shaft, left, closed 07/05/2019  . Acute lateral meniscus tear of left knee 07/05/2019  . Tibial plateau fracture, left 07/05/2019  . Closed bicondylar fracture of left tibial plateau 07/04/2019  . Encounter for antineoplastic chemotherapy 10/27/2017  . 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 Goals of care, counseling/discussion 10/11/2017  . Encounter for smoking cessation counseling 10/11/2017  . S/P  lobectomy of lung 09/30/2017   Ruben Im, PT 09/07/19 12:04 PM Phone: [phone] Fax: [phone] Alvera Singh 09/07/2019, 12:04 PM  [facility] [address] [place], [place], [address] Phone: [phone]   Fax:  [phone]  Name: Elidia Bonenfant MRN: [id number] Date of Birth: 1955-07-14

## 2019-09-07 NOTE — Patient Instructions (Signed)
Access Code: [email or web address] URL: [email or web address] Date: 09/07/2019 Prepared by: Ruben Im  Exercises Supine Quad Set - 1 x daily - 7 x weekly - 10 reps - 1 sets - 5 hold Hooklying Sequential Leg March and Lower - 1 x daily - 7 x weekly - 10 reps - 1 sets Sidelying Hip Abduction - 1 x daily - 7 x weekly - 10 reps - 2 sets Seated Hip Abduction - 1 x daily - 7 x weekly - 10 reps - 2 sets Backward Weight Shift and Opposite Arm Raise with Walker - 1 x daily - 7 x weekly - 10 reps - 1 sets Standing Heel Raise with Support - 1 x daily - 7 x weekly - 10 reps - 2 sets Standing Weight Shift - 1 x daily - 7 x weekly - 10 reps - 2 sets [place] - 1 x daily - 7 x weekly - 10 reps - 2 sets Seated Hamstring Curl with Anchored Resistance - 1 x daily - 7 x weekly - 10 reps - 1 sets Sitting Knee Extension with Resistance - 1 x daily - 7 x weekly - 10 reps - 1 sets Seated Knee Lifts with Resistance - 1 x daily - 7 x weekly - 10 reps - 1 sets Forward Step Up with Counter Support - 1 x daily - 7 x weekly - 1 sets - 10 reps

## 2019-09-09 ENCOUNTER — Other Ambulatory Visit: Payer: Self-pay

## 2019-09-09 ENCOUNTER — Ambulatory Visit: Payer: 59 | Admitting: Physical Therapy

## 2019-09-09 DIAGNOSIS — M25562 Pain in left knee: Secondary | ICD-10-CM | POA: Diagnosis not present

## 2019-09-09 DIAGNOSIS — M25662 Stiffness of left knee, not elsewhere classified: Secondary | ICD-10-CM

## 2019-09-09 DIAGNOSIS — R600 Localized edema: Secondary | ICD-10-CM

## 2019-09-09 DIAGNOSIS — M6281 Muscle weakness (generalized): Secondary | ICD-10-CM

## 2019-09-09 NOTE — Therapy (Signed)
[facility] [address], [place], [place], [address] Phone: [phone]   Fax:  [phone]  Physical Therapy Treatment  Patient Details  Name: Brandi Crosby MRN: [id number] Date of Birth: [birth year] Referring Provider (PT): Dr. Doreatha Martin    Encounter Date: 09/09/2019  PT End of Session - 09/09/19 1749    Visit Number  6    Number of Visits  60    Date for PT Re-Evaluation  10/19/19    Authorization Type  [employer]    PT Start Time  1102    PT Stop Time  1150    PT Time Calculation (min)  48 min    Activity Tolerance  Patient tolerated treatment well       Past Medical History:  Diagnosis Date  . Allergy   . Anxiety   . Depression   . Hyperlipemia   . Lung mass   . SCL ca dx'd 08/2017  . Shingles     Past Surgical History:  Procedure Laterality Date  . DENTAL SURGERY  2010   implant  . NODE DISSECTION Left 09/30/2017   Procedure: NODE DISSECTION;  Surgeon: Grace Isaac, MD;  Location: [place];  Service: Thoracic;  Laterality: Left;  . ORIF TIBIA PLATEAU Left 07/05/2019   Procedure: OPEN REDUCTION INTERNAL FIXATION (ORIF) TIBIAL PLATEAU;  Surgeon: Shona Needles, MD;  Location: [place];  Service: Orthopedics;  Laterality: Left;  . RIGHT OOPHORECTOMY    . TONSILLECTOMY AND ADENOIDECTOMY    . VIDEO ASSISTED THORACOSCOPY (VATS)/WEDGE RESECTION Left 09/30/2017   Procedure: VIDEO ASSISTED THORACOSCOPY (VATS)/LUNG RESECTION;  Surgeon: Grace Isaac, MD;  Location: [place];  Service: Thoracic;  Laterality: Left;  [name] VIDEO BRONCHOSCOPY N/A 09/30/2017   Procedure: VIDEO BRONCHOSCOPY;  Surgeon: Grace Isaac, MD;  Location: [facility] OR;  Service: Thoracic;  Laterality: N/A;  . WISDOM TOOTH EXTRACTION      There were no vitals filed for this visit.  Subjective Assessment - 09/09/19 1107    Subjective  I was sore all over.  Presents with RW/ cane.  Using the RW for stability for work appt yesterday.    Pertinent  History  healthy;  goes by "Brandi Crosby"; MD in April    Currently in Pain?  Yes    Pain Score  2     Pain Location  Knee    Pain Orientation  Left    Pain Descriptors / Indicators  Sore                       [facility] Adult PT Treatment/Exercise - 09/09/19 0001      Knee/Hip Exercises: Aerobic   Recumbent Bike  3 min, L2 full revolutions     Nustep  L2 5 min seat 9 UE/LE    while discussing status/progress     Knee/Hip Exercises: Machines for Strengthening   Total Gym Leg Press  seat 7 60# bil 12x; single leg  30# 2 sets of 5       Knee/Hip Exercises: Standing   Heel Raises  Both;15 reps    Terminal Knee Extension  Strengthening;Left;10 reps    Theraband Level (Terminal Knee Extension)  Level 2 (Red)    Hip Abduction  AROM;Right;Left;10 reps    Hip Extension  Stengthening;Left;15 reps    Extension Limitations  red band     Forward Step Up  Right;2 sets;10 reps;Hand Hold: 1;Step Height: 2"    Gait Training  step taps 10x right/left     Other Standing Knee Exercises  rocking on 2nd step 10x       Knee/Hip Exercises: Seated   Long Arc Quad  AROM;Left;2 sets;10 reps    Long Arc Quad Weight  2 lbs.    Long [employer] Limitations  only 2nd set with 2#     Heel Slides  Strengthening;Left;15 reps   yellow band   Heel Slides Limitations  red band    Other Seated Knee/Hip Exercises  floor slider quad with yellow band 15x       Vasopneumatic   Number Minutes Vasopneumatic   15 minutes    Vasopnuematic Location   Knee    Vasopneumatic Pressure  Medium    Vasopneumatic Temperature   coldest setting                PT Short Term Goals - 09/07/19 1202      PT SHORT TERM GOAL #1   Title  The patient will demonstrate knowlege of basic self care and ex's to promote ROM and healing    Status  Achieved      PT SHORT TERM GOAL #2   Title  Left knee flexion to 100 degrees needed for greater ease in/out of the car and with sit to stand    Time  4    Period  Weeks     Status  On-going      PT SHORT TERM GOAL #3   Title  The patient will have decreased knee edema with patella circumferential measurement decreased to 36 cm    Time  4    Period  Weeks    Status  On-going      PT SHORT TERM GOAL #4   Title  The patient will be able to ambulate > 300 feet for short to medium community distances    Status  Achieved      PT SHORT TERM GOAL #5   Title  The patient will be able to ascend/descend steps one at a time independently    Time  4    Period  Weeks    Status  On-going        PT Long Term Goals - 08/24/19 1759      PT LONG TERM GOAL #1   Title  The patient will be independent with a safe self progession of HEP    Time  8    Period  Weeks    Status  New    Target Date  09/21/19      PT LONG TERM GOAL #2   Title  The patient will have left knee ROM 0-125 degrees for greater ease ascending and descending stairs    Time  8    Period  Weeks    Status  New      PT LONG TERM GOAL #3   Title  The patient will be able to ambulate with a cane for medium community distances 500 feet needed for work with pain level 3/10 or less    Time  8    Period  Weeks    Status  New      PT LONG TERM GOAL #4   Title  The patient will have 4/5 left knee strength needed for ascending and descending stairs    Time  8    Period  Weeks    Status  New      PT LONG TERM GOAL #5  Title  FOTO functional outcome score improved from 53% limitation to 30% indicating improved function with less pain    Time  8    Period  Weeks    Status  New            Plan - 09/09/19 1750    Clinical Impression Statement  The patient is able to ambulate short distances with her SPC although lack of LE strength limits longer distance walking.  She continues to fatigue quickly with full weight bearing exercise with visible muscle quivering not only in quads but gastrocs, hamstrings and gluteals as well.  Repetitions kept at 5-10 reps only to avoid over fatigue.  Therapist  providing close supervision for safety with more challenging ex's and to monitor response.    Examination-Activity Limitations  Locomotion Level;Bathing;Carry;Stairs;Lift;Stand    Rehab Potential  Good    PT Frequency  2x / week    PT Duration  8 weeks    PT Treatment/Interventions  ADLs/Self Care Home Management;Aquatic Therapy;Cryotherapy;Electrical Stimulation;Neuromuscular re-education;Therapeutic exercise;Therapeutic activities;Stair training;Gait training;Patient/family education;Manual techniques;Dry needling;Taping;Vasopneumatic Device    PT Next Visit Plan  quad muscle activation low level; vasocompression for edema control;  check knee flexion ROM    PT Home Exercise Plan  Access Code: [email or web address]       Patient will benefit from skilled therapeutic intervention in order to improve the following deficits and impairments:  Abnormal gait, Decreased range of motion, Difficulty walking, Pain, Decreased activity tolerance, Impaired perceived functional ability, Increased edema, Decreased strength  Visit Diagnosis: Acute pain of left knee  Muscle weakness (generalized)  Stiffness of left knee, not elsewhere classified  Localized edema     Problem List Patient Active Problem List   Diagnosis Date Noted  . Fracture of tibial shaft, left, closed 07/05/2019  . Acute lateral meniscus tear of left knee 07/05/2019  . Tibial plateau fracture, left 07/05/2019  . Closed bicondylar fracture of left tibial plateau 07/04/2019  . Encounter for antineoplastic chemotherapy 10/27/2017  . 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 Goals of care, counseling/discussion 10/11/2017  . Encounter for smoking cessation counseling 10/11/2017  . S/P lobectomy of lung 09/30/2017   Ruben Im, PT 09/09/19 5:55 PM Phone: [phone] Fax: [phone] Alvera Singh 09/09/2019, 5:55 PM  [facility] [address],  [place], [place], [address] Phone: [phone]   Fax:  [phone]  Name: Brandi Crosby MRN: [id number] Date of Birth: [birth year]

## 2019-09-14 ENCOUNTER — Ambulatory Visit: Payer: 59 | Admitting: Physical Therapy

## 2019-09-14 ENCOUNTER — Encounter: Payer: Self-pay | Admitting: Physical Therapy

## 2019-09-14 ENCOUNTER — Other Ambulatory Visit: Payer: Self-pay

## 2019-09-14 DIAGNOSIS — M25562 Pain in left knee: Secondary | ICD-10-CM | POA: Diagnosis not present

## 2019-09-14 DIAGNOSIS — M6281 Muscle weakness (generalized): Secondary | ICD-10-CM

## 2019-09-14 DIAGNOSIS — M25662 Stiffness of left knee, not elsewhere classified: Secondary | ICD-10-CM

## 2019-09-14 NOTE — Therapy (Signed)
[facility] [address], [place], [place], [address] Phone: [phone]   Fax:  [phone]  Physical Therapy Treatment  Patient Details  Name: Brandi Crosby MRN: [id number] Date of Birth: 03-11-1956 Referring Provider (PT): Dr. Doreatha Martin    Encounter Date: 09/14/2019  PT End of Session - 09/14/19 1112    Visit Number  7    Number of Visits  60    Date for PT Re-Evaluation  10/19/19    Authorization Type  UHC    PT Start Time  1102    PT Stop Time  1150    PT Time Calculation (min)  48 min    Activity Tolerance  Patient tolerated treatment well       Past Medical History:  Diagnosis Date  . Allergy   . Anxiety   . Depression   . Hyperlipemia   . Lung mass   . SCL ca dx'd 08/2017  . Shingles     Past Surgical History:  Procedure Laterality Date  . DENTAL SURGERY  2010   implant  . NODE DISSECTION Left 09/30/2017   Procedure: NODE DISSECTION;  Surgeon: Grace Isaac, MD;  Location: [place];  Service: Thoracic;  Laterality: Left;  . ORIF TIBIA PLATEAU Left 07/05/2019   Procedure: OPEN REDUCTION INTERNAL FIXATION (ORIF) TIBIAL PLATEAU;  Surgeon: Shona Needles, MD;  Location: [place];  Service: Orthopedics;  Laterality: Left;  . RIGHT OOPHORECTOMY    . TONSILLECTOMY AND ADENOIDECTOMY    . VIDEO ASSISTED THORACOSCOPY (VATS)/WEDGE RESECTION Left 09/30/2017   Procedure: VIDEO ASSISTED THORACOSCOPY (VATS)/LUNG RESECTION;  Surgeon: Grace Isaac, MD;  Location: [place];  Service: Thoracic;  Laterality: Left;  [name] VIDEO BRONCHOSCOPY N/A 09/30/2017   Procedure: VIDEO BRONCHOSCOPY;  Surgeon: Grace Isaac, MD;  Location: [facility] OR;  Service: Thoracic;  Laterality: N/A;  . WISDOM TOOTH EXTRACTION      There were no vitals filed for this visit.  Subjective Assessment - 09/14/19 1103    Subjective  I've been using the cane much more.  I was so active yesterday and went to bed at 7pm.  Just sore with activity. No  pain when I wake up.    Pertinent History  healthy;  goes by "[name]"; MD in April    How long can you walk comfortably?  with cane household distances    Currently in Pain?  Yes    Pain Score  2     Pain Location  Knee    Pain Orientation  Left    Pain Type  Acute pain    Aggravating Factors   a lot of activity         [facility] PT Assessment - 09/14/19 0001      Observation/Other Assessments-Edema    Edema  Circumferential      AROM   Left Knee Extension  0    Left Knee Flexion  113                   [facility] Adult PT Treatment/Exercise - 09/14/19 0001      Therapeutic Activites    ADL's  reciprocal stairs with 2 railings with ease. Severe difficulty with descending reciprocally       Knee/Hip Exercises: Aerobic   Recumbent Bike  5 min, L1 full revolutions       Knee/Hip Exercises: Machines for Strengthening   Total Gym Leg Press   single leg  30# 2 sets of  5       Knee/Hip Exercises: Standing   Heel Raises Limitations  box lift/lowers    Terminal Knee Extension  Strengthening;Left;10 reps    Theraband Level (Terminal Knee Extension)  Level 3 (Green)    Hip Abduction  Stengthening;Left;10 reps    Abduction Limitations  green band     Hip Extension  Stengthening;Left;10 reps    Extension Limitations  green band     Forward Step Up  Left;1 set;10 reps;Hand Hold: 2;Step Height: 4"    SLS with Vectors  4 ways touches WB on left 10x     Gait Training  WB on left with right step taps 10x     Other Standing Knee Exercises  rocking on 2nd step 10x     Other Standing Knee Exercises  facing wall, hands on wall march 10x       Knee/Hip Exercises: Seated   Long Arc Quad  Strengthening;Left;10 reps    Long Arc Quad Limitations  crepitus with band     Hamstring Curl  Left;10 reps    Hamstring Limitations  red band       Vasopneumatic   Number Minutes Vasopneumatic   15 minutes    Vasopnuematic Location   Knee    Vasopneumatic Pressure  Medium    Vasopneumatic  Temperature   coldest setting                PT Short Term Goals - 09/07/19 1202      PT SHORT TERM GOAL #1   Title  The patient will demonstrate knowlege of basic self care and ex's to promote ROM and healing    Status  Achieved      PT SHORT TERM GOAL #2   Title  Left knee flexion to 100 degrees needed for greater ease in/out of the car and with sit to stand    Time  4    Period  Weeks    Status  On-going      PT SHORT TERM GOAL #3   Title  The patient will have decreased knee edema with patella circumferential measurement decreased to 36 cm    Time  4    Period  Weeks    Status  On-going      PT SHORT TERM GOAL #4   Title  The patient will be able to ambulate > 300 feet for short to medium community distances    Status  Achieved      PT SHORT TERM GOAL #5   Title  The patient will be able to ascend/descend steps one at a time independently    Time  4    Period  Weeks    Status  On-going        PT Long Term Goals - 09/14/19 1407      PT LONG TERM GOAL #1   Title  The patient will be independent with a safe self progession of HEP    Time  8    Period  Weeks    Status  On-going    Target Date  10/19/19      PT LONG TERM GOAL #2   Title  The patient will have left knee ROM 0-125 degrees for greater ease ascending and descending stairs    Time  8    Period  Weeks    Status  On-going    Target Date  10/19/19      PT LONG TERM GOAL #3  Title  The patient will be able to ambulate with a cane for medium community distances 500 feet needed for work with pain level 3/10 or less    Time  8    Period  Weeks    Status  On-going      PT LONG TERM GOAL #4   Title  The patient will have 4/5 left knee strength needed for ascending and descending stairs    Time  8    Period  Weeks    Status  On-going      PT LONG TERM GOAL #5   Title  FOTO functional outcome score improved from 53% limitation to 30% indicating improved function with less pain    Time  8     Period  Weeks    Status  On-going            Plan - 09/14/19 1143    Clinical Impression Statement  The patient has palpable knee crepitus left > right with knee extension with light to medium resistance but not painful.  Much improved knee flexion ROM to 113 degrees.  She is able to ascend steps reciprocally with bil UE use on railings but lacks motor control to descend steps reciprocally without full reliance on UEs.  She has muscle fatigue particularly with weight bearing ex.  Therapist monitoring response with all interventions and modifying as needed for fatigue.    Rehab Potential  Good    PT Frequency  2x / week    PT Duration  8 weeks    PT Treatment/Interventions  ADLs/Self Care Home Management;Aquatic Therapy;Cryotherapy;Electrical Stimulation;Neuromuscular re-education;Therapeutic exercise;Therapeutic activities;Stair training;Gait training;Patient/family education;Manual techniques;Dry needling;Taping;Vasopneumatic Device    PT Next Visit Plan  quad muscle activation low to medium  level; vasocompression for edema control; check FOTO       Patient will benefit from skilled therapeutic intervention in order to improve the following deficits and impairments:  Abnormal gait, Decreased range of motion, Difficulty walking, Pain, Decreased activity tolerance, Impaired perceived functional ability, Increased edema, Decreased strength  Visit Diagnosis: Acute pain of left knee  Muscle weakness (generalized)  Stiffness of left knee, not elsewhere classified     Problem List Patient Active Problem List   Diagnosis Date Noted  . Fracture of tibial shaft, left, closed 07/05/2019  . Acute lateral meniscus tear of left knee 07/05/2019  . Tibial plateau fracture, left 07/05/2019  . Closed bicondylar fracture of left tibial plateau 07/04/2019  . Encounter for antineoplastic chemotherapy 10/27/2017  . 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 Goals of  care, counseling/discussion 10/11/2017  . Encounter for smoking cessation counseling 10/11/2017  . S/P lobectomy of lung 09/30/2017   Ruben Im, PT 09/14/19 2:08 PM Phone: [phone] Fax: [phone] Alvera Singh 09/14/2019, 2:08 PM  [facility] [address] [place], [place], [address] Phone: [phone]   Fax:  [phone]  Name: Jaliza Seifried MRN: [id number] Date of Birth: Feb 22, 1956

## 2019-09-16 ENCOUNTER — Ambulatory Visit: Payer: 59 | Admitting: Physical Therapy

## 2019-09-16 ENCOUNTER — Other Ambulatory Visit: Payer: Self-pay

## 2019-09-16 ENCOUNTER — Encounter: Payer: Self-pay | Admitting: Physical Therapy

## 2019-09-16 DIAGNOSIS — M25562 Pain in left knee: Secondary | ICD-10-CM | POA: Diagnosis not present

## 2019-09-16 DIAGNOSIS — M25662 Stiffness of left knee, not elsewhere classified: Secondary | ICD-10-CM

## 2019-09-16 DIAGNOSIS — M6281 Muscle weakness (generalized): Secondary | ICD-10-CM

## 2019-09-16 DIAGNOSIS — R600 Localized edema: Secondary | ICD-10-CM

## 2019-09-16 NOTE — Therapy (Signed)
[facility] [address], [place], [place], [address] Phone: [phone]   Fax:  [phone]  Physical Therapy Treatment  Patient Details  Name: Brandi Crosby MRN: [id number] Date of Birth: 1956/01/12 Referring Provider (PT): Dr. Doreatha Martin    Encounter Date: 09/16/2019  PT End of Session - 09/16/19 1110    Visit Number  8    Number of Visits  60    Date for PT Re-Evaluation  10/19/19    Authorization Type  UHC    PT Start Time  1100    PT Stop Time  1150    PT Time Calculation (min)  50 min    Activity Tolerance  Patient tolerated treatment well       Past Medical History:  Diagnosis Date  . Allergy   . Anxiety   . Depression   . Hyperlipemia   . Lung mass   . SCL ca dx'd 08/2017  . Shingles     Past Surgical History:  Procedure Laterality Date  . DENTAL SURGERY  2010   implant  . NODE DISSECTION Left 09/30/2017   Procedure: NODE DISSECTION;  Surgeon: Grace Isaac, MD;  Location: [place];  Service: Thoracic;  Laterality: Left;  . ORIF TIBIA PLATEAU Left 07/05/2019   Procedure: OPEN REDUCTION INTERNAL FIXATION (ORIF) TIBIAL PLATEAU;  Surgeon: Shona Needles, MD;  Location: [place];  Service: Orthopedics;  Laterality: Left;  . RIGHT OOPHORECTOMY    . TONSILLECTOMY AND ADENOIDECTOMY    . VIDEO ASSISTED THORACOSCOPY (VATS)/WEDGE RESECTION Left 09/30/2017   Procedure: VIDEO ASSISTED THORACOSCOPY (VATS)/LUNG RESECTION;  Surgeon: Grace Isaac, MD;  Location: [place];  Service: Thoracic;  Laterality: Left;  [name] VIDEO BRONCHOSCOPY N/A 09/30/2017   Procedure: VIDEO BRONCHOSCOPY;  Surgeon: Grace Isaac, MD;  Location: [facility] OR;  Service: Thoracic;  Laterality: N/A;  . WISDOM TOOTH EXTRACTION      There were no vitals filed for this visit.  Subjective Assessment - 09/16/19 1103    Subjective  I'm sore in my arms and back from so much walking/activity.  Using the walker today.   3/10 pain upon arrival but  on bike 0/10.    Pertinent History  healthy;  goes by "[name]"; MD in April    Currently in Pain?  Yes    Pain Score  3     Pain Location  Knee    Pain Orientation  Left                       [facility] Adult PT Treatment/Exercise - 09/16/19 0001      Knee/Hip Exercises: Stretches   Other Knee/Hip Stretches  2nd step ROM 20x       Knee/Hip Exercises: Aerobic   Recumbent Bike  8 min, L1 full revolutions       Knee/Hip Exercises: Machines for Strengthening   Total Gym Leg Press   single leg  30# 3 sets of 5       Knee/Hip Exercises: Standing   Heel Raises  Both;15 reps    Forward Step Up  Left;1 set;10 reps;Hand Hold: 2;Step Height: 4"    Walking with Sports Cord  red band around thighs cowboy walk only 2 steps 10x  UE support used;  Resisted blue handles 2 steps backward 10x   Gait Training  step taps 10x right/left     Other Standing Knee Exercises  floor sliders 3x5 right/left  Other Standing Knee Exercises  retro step holding blue band 20x       Vasopneumatic   Number Minutes Vasopneumatic   15 minutes    Vasopnuematic Location   Knee    Vasopneumatic Pressure  Medium    Vasopneumatic Temperature   coldest setting                PT Short Term Goals - 09/16/19 1735      PT SHORT TERM GOAL #1   Title  The patient will demonstrate knowlege of basic self care and ex's to promote ROM and healing    Status  Achieved      PT SHORT TERM GOAL #2   Title  Left knee flexion to 100 degrees needed for greater ease in/out of the car and with sit to stand    Status  Achieved      PT SHORT TERM GOAL #3   Title  The patient will have decreased knee edema with patella circumferential measurement decreased to 36 cm    Time  4    Period  Weeks    Status  On-going      PT SHORT TERM GOAL #4   Title  The patient will be able to ambulate > 300 feet for short to medium community distances    Status  Achieved      PT SHORT TERM GOAL #5   Title  The patient will  be able to ascend/descend steps one at a time independently    Status  Achieved        PT Long Term Goals - 09/14/19 1407      PT LONG TERM GOAL #1   Title  The patient will be independent with a safe self progession of HEP    Time  8    Period  Weeks    Status  On-going    Target Date  10/19/19      PT LONG TERM GOAL #2   Title  The patient will have left knee ROM 0-125 degrees for greater ease ascending and descending stairs    Time  8    Period  Weeks    Status  On-going    Target Date  10/19/19      PT LONG TERM GOAL #3   Title  The patient will be able to ambulate with a cane for medium community distances 500 feet needed for work with pain level 3/10 or less    Time  8    Period  Weeks    Status  On-going      PT LONG TERM GOAL #4   Title  The patient will have 4/5 left knee strength needed for ascending and descending stairs    Time  8    Period  Weeks    Status  On-going      PT LONG TERM GOAL #5   Title  FOTO functional outcome score improved from 53% limitation to 30% indicating improved function with less pain    Time  8    Period  Weeks    Status  On-going            Plan - 09/16/19 1143    Clinical Impression Statement  The patient continues to progress with weight bearing ex's although visible muscle quivering/shaking in response to fatigue.  She needs the brace for continued  stability and safety.  She is able to continue with progression of exercises without pain.  Therapist  monitoring response and providing close supervision for safety with more challenging exercises.    Rehab Potential  Good    PT Frequency  2x / week    PT Duration  8 weeks    PT Treatment/Interventions  ADLs/Self Care Home Management;Aquatic Therapy;Cryotherapy;Electrical Stimulation;Neuromuscular re-education;Therapeutic exercise;Therapeutic activities;Stair training;Gait training;Patient/family education;Manual techniques;Dry needling;Taping;Vasopneumatic Device    PT Next  Visit Plan  quad muscle activation low to medium  level; vasocompression for edema control; check FOTO    PT Home Exercise Plan  Access Code: [email or web address]       Patient will benefit from skilled therapeutic intervention in order to improve the following deficits and impairments:  Abnormal gait, Decreased range of motion, Difficulty walking, Pain, Decreased activity tolerance, Impaired perceived functional ability, Increased edema, Decreased strength  Visit Diagnosis: Acute pain of left knee  Muscle weakness (generalized)  Stiffness of left knee, not elsewhere classified  Localized edema     Problem List Patient Active Problem List   Diagnosis Date Noted  . Fracture of tibial shaft, left, closed 07/05/2019  . Acute lateral meniscus tear of left knee 07/05/2019  . Tibial plateau fracture, left 07/05/2019  . Closed bicondylar fracture of left tibial plateau 07/04/2019  . Encounter for antineoplastic chemotherapy 10/27/2017  . 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 Goals of care, counseling/discussion 10/11/2017  . Encounter for smoking cessation counseling 10/11/2017  . S/P lobectomy of lung 09/30/2017   Ruben Im, PT 09/16/19 5:37 PM Phone: [phone] Fax: [phone] Alvera Singh 09/16/2019, 5:36 PM  [facility] [address] [place], [place], [address] Phone: [phone]   Fax:  [phone]  Name: Brandi Crosby MRN: [id number] Date of Birth: May 09, 1956

## 2019-09-20 ENCOUNTER — Other Ambulatory Visit: Payer: Self-pay | Admitting: Family Medicine

## 2019-09-20 DIAGNOSIS — Z1231 Encounter for screening mammogram for malignant neoplasm of breast: Secondary | ICD-10-CM

## 2019-09-22 ENCOUNTER — Other Ambulatory Visit: Payer: Self-pay | Admitting: Nurse Practitioner

## 2019-09-22 DIAGNOSIS — R5381 Other malaise: Secondary | ICD-10-CM

## 2019-09-23 ENCOUNTER — Ambulatory Visit: Payer: 59 | Attending: Student | Admitting: Physical Therapy

## 2019-09-23 ENCOUNTER — Other Ambulatory Visit: Payer: Self-pay | Admitting: Nurse Practitioner

## 2019-09-23 ENCOUNTER — Other Ambulatory Visit: Payer: Self-pay

## 2019-09-23 ENCOUNTER — Encounter: Payer: Self-pay | Admitting: Physical Therapy

## 2019-09-23 DIAGNOSIS — R600 Localized edema: Secondary | ICD-10-CM | POA: Insufficient documentation

## 2019-09-23 DIAGNOSIS — M25662 Stiffness of left knee, not elsewhere classified: Secondary | ICD-10-CM | POA: Diagnosis present

## 2019-09-23 DIAGNOSIS — M6281 Muscle weakness (generalized): Secondary | ICD-10-CM | POA: Insufficient documentation

## 2019-09-23 DIAGNOSIS — M25562 Pain in left knee: Secondary | ICD-10-CM | POA: Diagnosis not present

## 2019-09-23 DIAGNOSIS — Z780 Asymptomatic menopausal state: Secondary | ICD-10-CM

## 2019-09-23 NOTE — Therapy (Signed)
[facility] [address] [place], [place], [address] Phone: [phone]   Fax:  [phone]  Physical Therapy Treatment  Patient Details  Name: Brandi Crosby MRN: [id number] Date of Birth: [birth year] Referring Provider (PT): Dr. Doreatha Martin    Encounter Date: 09/23/2019  PT End of Session - 09/23/19 1053    Visit Number  9    Number of Visits  60    Date for PT Re-Evaluation  10/19/19    Authorization Type  UHC    PT Start Time  1053    PT Stop Time  1143    PT Time Calculation (min)  50 min    Activity Tolerance  Patient tolerated treatment well    Behavior During Therapy  [facility] for tasks assessed/performed       Past Medical History:  Diagnosis Date  . Allergy   . Anxiety   . Depression   . Hyperlipemia   . Lung mass   . SCL ca dx'd 08/2017  . Shingles     Past Surgical History:  Procedure Laterality Date  . DENTAL SURGERY  2010   implant  . NODE DISSECTION Left 09/30/2017   Procedure: NODE DISSECTION;  Surgeon: Grace Isaac, MD;  Location: [place];  Service: Thoracic;  Laterality: Left;  . ORIF TIBIA PLATEAU Left 07/05/2019   Procedure: OPEN REDUCTION INTERNAL FIXATION (ORIF) TIBIAL PLATEAU;  Surgeon: Shona Needles, MD;  Location: [place];  Service: Orthopedics;  Laterality: Left;  . RIGHT OOPHORECTOMY    . TONSILLECTOMY AND ADENOIDECTOMY    . VIDEO ASSISTED THORACOSCOPY (VATS)/WEDGE RESECTION Left 09/30/2017   Procedure: VIDEO ASSISTED THORACOSCOPY (VATS)/LUNG RESECTION;  Surgeon: Grace Isaac, MD;  Location: [place];  Service: Thoracic;  Laterality: Left;  [name] VIDEO BRONCHOSCOPY N/A 09/30/2017   Procedure: VIDEO BRONCHOSCOPY;  Surgeon: Grace Isaac, MD;  Location: [facility] OR;  Service: Thoracic;  Laterality: N/A;  . WISDOM TOOTH EXTRACTION      There were no vitals filed for this visit.  Subjective Assessment - 09/23/19 1054    Subjective  I did a lot of work around the house yesterday so the  knee is sore today but the PT exercises here tend to help.    Pertinent History  healthy;  goes by "Brandi Crosby"; MD in April    Limitations  Walking    How long can you walk comfortably?  with cane household distances    Diagnostic tests  last x-rays looked great ready for WBAT    Patient Stated Goals  get back to normal;  working in yard; playing tennis; get back to walking    Currently in Pain?  Yes    Pain Score  4     Pain Location  Knee    Pain Orientation  Left    Pain Descriptors / Indicators  Sore    Pain Type  Acute pain    Aggravating Factors   increasing activity around the house, Advil    Pain Relieving Factors  ice         [facility] PT Assessment - 09/23/19 0001      Observation/Other Assessments   Focus on Therapeutic Outcomes (FOTO)   47% from 53% at eval                   [facility] Adult PT Treatment/Exercise - 09/23/19 0001      Exercises   Exercises  Knee/Hip      Knee/Hip Exercises:  Stretches   Other Knee/Hip Stretches  2nd step ROM 20x       Knee/Hip Exercises: Aerobic   Recumbent Bike  8' L1, PT present to do FOTO and discuss increased household task activity      Knee/Hip Exercises: Machines for Strengthening   Total Gym Leg Press  single leg 30# 2x5, double leg 2x10 alt with single leg sets      Knee/Hip Exercises: Standing   Terminal Knee Extension  Strengthening;Left;10 reps;Theraband    Theraband Level (Terminal Knee Extension)  Level 3 (Green)    Terminal Knee Extension Limitations  hold 5 sec, PT cued weight shift into Lt LE as tol, holding cane    Hip Abduction  Stengthening;Left;10 reps;Knee straight    Abduction Limitations  green band    Hip Extension  Stengthening;Left;1 set;10 reps;Knee straight    Extension Limitations  green band    Forward Step Up  Left;1 set;Step Height: 4";10 reps;Hand Hold: 1    SLS with Vectors  3 way touches WB on Lt x 10 rounds, single UE support    Other Standing Knee Exercises  retro step holding blue band x  10 reps      Vasopneumatic   Number Minutes Vasopneumatic   15 minutes    Vasopnuematic Location   Knee    Vasopneumatic Pressure  Medium    Vasopneumatic Temperature   coldest setting                PT Short Term Goals - 09/16/19 1735      PT SHORT TERM GOAL #1   Title  The patient will demonstrate knowlege of basic self care and ex's to promote ROM and healing    Status  Achieved      PT SHORT TERM GOAL #2   Title  Left knee flexion to 100 degrees needed for greater ease in/out of the car and with sit to stand    Status  Achieved      PT SHORT TERM GOAL #3   Title  The patient will have decreased knee edema with patella circumferential measurement decreased to 36 cm    Time  4    Period  Weeks    Status  On-going      PT SHORT TERM GOAL #4   Title  The patient will be able to ambulate > 300 feet for short to medium community distances    Status  Achieved      PT SHORT TERM GOAL #5   Title  The patient will be able to ascend/descend steps one at a time independently    Status  Achieved        PT Long Term Goals - 09/14/19 1407      PT LONG TERM GOAL #1   Title  The patient will be independent with a safe self progession of HEP    Time  8    Period  Weeks    Status  On-going    Target Date  10/19/19      PT LONG TERM GOAL #2   Title  The patient will have left knee ROM 0-125 degrees for greater ease ascending and descending stairs    Time  8    Period  Weeks    Status  On-going    Target Date  10/19/19      PT LONG TERM GOAL #3   Title  The patient will be able to ambulate with a cane for medium  community distances 500 feet needed for work with pain level 3/10 or less    Time  8    Period  Weeks    Status  On-going      PT LONG TERM GOAL #4   Title  The patient will have 4/5 left knee strength needed for ascending and descending stairs    Time  8    Period  Weeks    Status  On-going      PT LONG TERM GOAL #5   Title  FOTO functional outcome  score improved from 53% limitation to 30% indicating improved function with less pain    Time  8    Period  Weeks    Status  On-going            Plan - 09/23/19 1128    Clinical Impression Statement  Pt arrived with increased soreness (4/10) from a very active day at home yesterday cleaning and cooking.  She did not use the cane for part of the activity at home yesterday.  FOTO score has reduced from 53% to 47% today demonstrating improving functional limitation.  Pt continues to work on quad activation on [place].  She ambulates with use of SPC and WBAT through Lt LE.  She had some increased soreness with ther ex today.  PT used vasopneumatic to address medial knee edema and increased soreness end of session.  She will continue to benefit from skilled PT for strength, ROM, gait training and return to functional LE use along POC.    Examination-Activity Limitations  Locomotion Level;Bathing;Carry;Stairs;Lift;Stand    Examination-Participation Restrictions  Community Activity;Driving;Yard Work;Other;Shop    Rehab Potential  Good    PT Frequency  2x / week    PT Duration  8 weeks    PT Treatment/Interventions  ADLs/Self Care Home Management;Aquatic Therapy;Cryotherapy;Electrical Stimulation;Neuromuscular re-education;Therapeutic exercise;Therapeutic activities;Stair training;Gait training;Patient/family education;Manual techniques;Dry needling;Taping;Vasopneumatic Device    PT Next Visit Plan  continue quad muscle activation, hip strength, vaso for edema control    PT Home Exercise Plan  Access Code: [email or web address]    Consulted and Agree with Plan of Care  Patient       Patient will benefit from skilled therapeutic intervention in order to improve the following deficits and impairments:     Visit Diagnosis: Acute pain of left knee  Muscle weakness (generalized)  Stiffness of left knee, not elsewhere classified  Localized edema     Problem List Patient Active Problem List   Diagnosis Date  Noted  . Fracture of tibial shaft, left, closed 07/05/2019  . Acute lateral meniscus tear of left knee 07/05/2019  . Tibial plateau fracture, left 07/05/2019  . Closed bicondylar fracture of left tibial plateau 07/04/2019  . Encounter for antineoplastic chemotherapy 10/27/2017  . 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 Goals of care, counseling/discussion 10/11/2017  . Encounter for smoking cessation counseling 10/11/2017  . S/P lobectomy of lung 09/30/2017    Baruch Merl, PT 09/23/19 11:35 AM   [facility] [address], [place], [place], [address] Phone: [phone]   Fax:  [phone]  Name: Brandi Crosby MRN: [id number] Date of Birth: [birth year]

## 2019-09-29 ENCOUNTER — Ambulatory Visit: Payer: 59 | Admitting: Physical Therapy

## 2019-09-29 ENCOUNTER — Encounter: Payer: Self-pay | Admitting: Physical Therapy

## 2019-09-29 ENCOUNTER — Other Ambulatory Visit: Payer: Self-pay

## 2019-09-29 DIAGNOSIS — M25562 Pain in left knee: Secondary | ICD-10-CM | POA: Diagnosis not present

## 2019-09-29 DIAGNOSIS — M25662 Stiffness of left knee, not elsewhere classified: Secondary | ICD-10-CM

## 2019-09-29 DIAGNOSIS — M6281 Muscle weakness (generalized): Secondary | ICD-10-CM

## 2019-09-29 DIAGNOSIS — R600 Localized edema: Secondary | ICD-10-CM

## 2019-09-29 NOTE — Therapy (Signed)
[facility] [address], [place], [place], [address] Phone: [phone]   Fax:  [phone]  Physical Therapy Treatment  Patient Details  Name: Brandi Crosby MRN: [id number] Date of Birth: [birth year] Referring Provider (PT): Dr. Doreatha Martin    Encounter Date: 09/29/2019  PT End of Session - 09/29/19 1239    Visit Number  10    Number of Visits  60    Date for PT Re-Evaluation  10/19/19    Authorization Type  UHC    PT Start Time  9201    PT Stop Time  1324    PT Time Calculation (min)  49 min    Activity Tolerance  Patient tolerated treatment well    Behavior During Therapy  [facility] for tasks assessed/performed       Past Medical History:  Diagnosis Date  . Allergy   . Anxiety   . Depression   . Hyperlipemia   . Lung mass   . SCL ca dx'd 08/2017  . Shingles     Past Surgical History:  Procedure Laterality Date  . DENTAL SURGERY  2010   implant  . NODE DISSECTION Left 09/30/2017   Procedure: NODE DISSECTION;  Surgeon: Grace Isaac, MD;  Location: [place];  Service: Thoracic;  Laterality: Left;  . ORIF TIBIA PLATEAU Left 07/05/2019   Procedure: OPEN REDUCTION INTERNAL FIXATION (ORIF) TIBIAL PLATEAU;  Surgeon: Shona Needles, MD;  Location: [place];  Service: Orthopedics;  Laterality: Left;  . RIGHT OOPHORECTOMY    . TONSILLECTOMY AND ADENOIDECTOMY    . VIDEO ASSISTED THORACOSCOPY (VATS)/WEDGE RESECTION Left 09/30/2017   Procedure: VIDEO ASSISTED THORACOSCOPY (VATS)/LUNG RESECTION;  Surgeon: Grace Isaac, MD;  Location: [place];  Service: Thoracic;  Laterality: Left;  [name] VIDEO BRONCHOSCOPY N/A 09/30/2017   Procedure: VIDEO BRONCHOSCOPY;  Surgeon: Grace Isaac, MD;  Location: [facility] OR;  Service: Thoracic;  Laterality: N/A;  . WISDOM TOOTH EXTRACTION      There were no vitals filed for this visit.  Subjective Assessment - 09/29/19 1240    Subjective  My leg has been huting the last couple of days in the  shin. It resolves with ice. I'm a little tired today. Might be due to pollen.  Seeing the surgeon 10/19/19. She states she takes of brace at home sometimes when doing chores.    Pertinent History  healthy;  goes by "Brandi Crosby"; MD in April    Limitations  Walking    How long can you walk comfortably?  15-20 min in neighborhood with cane    Diagnostic tests  last x-rays looked great ready for WBAT    Patient Stated Goals  get back to normal;  working in yard; playing tennis; get back to walking    Currently in Pain?  Yes    Pain Score  3     Pain Location  Knee    Pain Orientation  Left    Pain Descriptors / Indicators  Sore    Pain Type  Acute pain         [facility] PT Assessment - 09/29/19 0001      Circumferential Edema   Circumferential - Left   mid patellar 36 1/2 cm       AROM   Left Knee Extension  0    Left Knee Flexion  120      Strength   Left Knee Flexion  4+/5    Left Knee Extension  4+/5  [facility] Adult PT Treatment/Exercise - 09/29/19 0001      Knee/Hip Exercises: Aerobic   Recumbent Bike  8 min L1 full revolutions      Knee/Hip Exercises: Standing   Terminal Knee Extension  Strengthening;Left;10 reps;Theraband    Theraband Level (Terminal Knee Extension)  Level 4 (Blue)    Terminal Knee Extension Limitations  hold 5 sec, PT cued weight shift into Lt LE as tol, holding cane    Hip Abduction  Stengthening;Left;10 reps;Knee straight   then 10 reps standing on left no band   Abduction Limitations  green band    Hip Extension  Stengthening;Left;1 set;10 reps;Knee straight   then 10 reps standing on left, no band   Extension Limitations  green band    Forward Step Up  Left;1 set;Step Height: 4";Hand Hold: 1;15 reps    SLS with Vectors  3 way touches with Rt foot on slider  WB on Lt x 10 rounds, single UE support      Modalities   Modalities  Cryotherapy      Cryotherapy   Number Minutes Cryotherapy  10 Minutes    Cryotherapy Location  Knee     Type of Cryotherapy  Ice pack               PT Short Term Goals - 09/29/19 1320      PT SHORT TERM GOAL #3   Title  The patient will have decreased knee edema with patella circumferential measurement decreased to 36 cm    Baseline  36.5 cm    Status  On-going        PT Long Term Goals - 09/29/19 1319      PT LONG TERM GOAL #1   Title  The patient will be independent with a safe self progession of HEP    Status  On-going      PT LONG TERM GOAL #2   Title  The patient will have left knee ROM 0-125 degrees for greater ease ascending and descending stairs    Baseline  0-120 degrees    Status  On-going      PT LONG TERM GOAL #3   Title  The patient will be able to ambulate with a cane for medium community distances 500 feet needed for work with pain level 3/10 or less    Status  Achieved      PT LONG TERM GOAL #4   Title  The patient will have 4/5 left knee strength needed for ascending and descending stairs    Baseline  MMT has met goal, but functionally knee is unstable with stairs and SLS activities.    Status  Partially Met      PT LONG TERM GOAL #5   Title  FOTO functional outcome score improved from 53% limitation to 30% indicating improved function with less pain    Status  On-going            Plan - 09/29/19 1321    Clinical Impression Statement  Patient is progressing well toward LTGs. Her strength is improving but functionally she shows instability with SLS and stairs. Edema has decreased but remains in knee and lower leg. She may benefit from [facility] to left calf. Unmet LTGs are ongoing. Vaso in use today, so ice pack was administered at end of treatment.    PT Treatment/Interventions  ADLs/Self Care Home Management;Aquatic Therapy;Cryotherapy;Electrical Stimulation;Neuromuscular re-education;Therapeutic exercise;Therapeutic activities;Stair training;Gait training;Patient/family education;Manual techniques;Dry needling;Taping;Vasopneumatic Device    PT  Next Visit  Plan  continue quad muscle activation, hip strength, vaso for edema control; manual therapy to left gastroc/soleus prn.    PT Home Exercise Plan  Access Code: [email or web address]    Consulted and Agree with Plan of Care  Patient       Patient will benefit from skilled therapeutic intervention in order to improve the following deficits and impairments:  Abnormal gait, Decreased range of motion, Difficulty walking, Pain, Decreased activity tolerance, Impaired perceived functional ability, Increased edema, Decreased strength  Visit Diagnosis: Acute pain of left knee  Muscle weakness (generalized)  Stiffness of left knee, not elsewhere classified  Localized edema     Problem List Patient Active Problem List   Diagnosis Date Noted  . Fracture of tibial shaft, left, closed 07/05/2019  . Acute lateral meniscus tear of left knee 07/05/2019  . Tibial plateau fracture, left 07/05/2019  . Closed bicondylar fracture of left tibial plateau 07/04/2019  . Encounter for antineoplastic chemotherapy 10/27/2017  . 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 Goals of care, counseling/discussion 10/11/2017  . Encounter for smoking cessation counseling 10/11/2017  . S/P lobectomy of lung 09/30/2017    Madelyn Flavors PT 09/29/2019, 1:27 PM  [facility] [address] [place], [place], [address] Phone: [phone]   Fax:  [phone]  Name: Brandi Crosby MRN: [id number] Date of Birth: Sep 10, 1955

## 2019-10-01 ENCOUNTER — Other Ambulatory Visit: Payer: Self-pay

## 2019-10-01 ENCOUNTER — Ambulatory Visit: Payer: 59 | Admitting: Physical Therapy

## 2019-10-01 DIAGNOSIS — M6281 Muscle weakness (generalized): Secondary | ICD-10-CM

## 2019-10-01 DIAGNOSIS — R600 Localized edema: Secondary | ICD-10-CM

## 2019-10-01 DIAGNOSIS — M25562 Pain in left knee: Secondary | ICD-10-CM

## 2019-10-01 DIAGNOSIS — M25662 Stiffness of left knee, not elsewhere classified: Secondary | ICD-10-CM

## 2019-10-01 NOTE — Therapy (Signed)
[facility] [address], [place], [place], [address] Phone: [phone]   Fax:  [phone]  Physical Therapy Treatment  Patient Details  Name: Brandi Crosby MRN: [id number] Date of Birth: 12-06-1955 Referring Provider (PT): Dr. Doreatha Martin    Encounter Date: 10/01/2019  PT End of Session - 10/01/19 1142    Visit Number  11    Number of Visits  60    Date for PT Re-Evaluation  10/19/19    Authorization Type  UHC    PT Start Time  1100    PT Stop Time  1152    PT Time Calculation (min)  52 min    Activity Tolerance  Patient tolerated treatment well       Past Medical History:  Diagnosis Date  . Allergy   . Anxiety   . Depression   . Hyperlipemia   . Lung mass   . SCL ca dx'd 08/2017  . Shingles     Past Surgical History:  Procedure Laterality Date  . DENTAL SURGERY  2010   implant  . NODE DISSECTION Left 09/30/2017   Procedure: NODE DISSECTION;  Surgeon: Grace Isaac, MD;  Location: [place];  Service: Thoracic;  Laterality: Left;  . ORIF TIBIA PLATEAU Left 07/05/2019   Procedure: OPEN REDUCTION INTERNAL FIXATION (ORIF) TIBIAL PLATEAU;  Surgeon: Shona Needles, MD;  Location: [place];  Service: Orthopedics;  Laterality: Left;  . RIGHT OOPHORECTOMY    . TONSILLECTOMY AND ADENOIDECTOMY    . VIDEO ASSISTED THORACOSCOPY (VATS)/WEDGE RESECTION Left 09/30/2017   Procedure: VIDEO ASSISTED THORACOSCOPY (VATS)/LUNG RESECTION;  Surgeon: Grace Isaac, MD;  Location: [place];  Service: Thoracic;  Laterality: Left;  [name] VIDEO BRONCHOSCOPY N/A 09/30/2017   Procedure: VIDEO BRONCHOSCOPY;  Surgeon: Grace Isaac, MD;  Location: [facility] OR;  Service: Thoracic;  Laterality: N/A;  . WISDOM TOOTH EXTRACTION      There were no vitals filed for this visit.  Subjective Assessment - 10/01/19 1103    Subjective  I've been "good sore".   Driving now.  Now using cane, no longer needs walker.  Sometimes no cane but I pay for it.   I can take my dog for a walk now.    Pertinent History  healthy;  goes by "Brandi Crosby"; MD in April 27th wearing brace until then    Limitations  Walking    Patient Stated Goals  get back to normal;  working in yard; playing tennis; get back to walking    Currently in Pain?  Yes    Pain Score  3     Pain Location  Knee    Pain Orientation  Left    Pain Type  Acute pain         [facility] PT Assessment - 10/01/19 0001      AROM   Left Knee Extension  0    Left Knee Flexion  120                   [facility] Adult PT Treatment/Exercise - 10/01/19 0001      Knee/Hip Exercises: Stretches   Active Hamstring Stretch  Left;5 reps    Active Hamstring Stretch Limitations  on 2nd step     Gastroc Stretch Limitations  standing slant board 5x     Other Knee/Hip Stretches  2nd step ROM 20x       Knee/Hip Exercises: Aerobic   Recumbent Bike  8 min L1 full revolutions  Knee/Hip Exercises: Machines for Strengthening   Total Gym Leg Press  seat 8 60# 10x; left only 30# 10x       Knee/Hip Exercises: Standing   Forward Step Up  Left;10 reps;Hand Hold: 2;Step Height: 6"    SLS  standing on left with 5# in left hand doing small range single leg dead lifts; holding on right on railing     Walking with Sports Cord  25# backwards only smaller distance 6x    close supervision for safety   Other Standing Knee Exercises  Right foot resting on black foam while doing mini squats 10x (forcing more weight on left)     Other Standing Knee Exercises  standing on black foam with UE movements       Vasopneumatic   Number Minutes Vasopneumatic   15 minutes    Vasopnuematic Location   Knee    Vasopneumatic Pressure  Medium    Vasopneumatic Temperature   coldest setting                PT Short Term Goals - 09/29/19 1320      PT SHORT TERM GOAL #3   Title  The patient will have decreased knee edema with patella circumferential measurement decreased to 36 cm    Baseline  36.5 cm    Status   On-going        PT Long Term Goals - 09/29/19 1319      PT LONG TERM GOAL #1   Title  The patient will be independent with a safe self progession of HEP    Status  On-going      PT LONG TERM GOAL #2   Title  The patient will have left knee ROM 0-125 degrees for greater ease ascending and descending stairs    Baseline  0-120 degrees    Status  On-going      PT LONG TERM GOAL #3   Title  The patient will be able to ambulate with a cane for medium community distances 500 feet needed for work with pain level 3/10 or less    Status  Achieved      PT LONG TERM GOAL #4   Title  The patient will have 4/5 left knee strength needed for ascending and descending stairs    Baseline  MMT has met goal, but functionally knee is unstable with stairs and SLS activities.    Status  Partially Met      PT LONG TERM GOAL #5   Title  FOTO functional outcome score improved from 53% limitation to 30% indicating improved function with less pain    Status  On-going            Plan - 10/01/19 1142    Clinical Impression Statement  The patient is progressing with functional mobility, now driving, walking with the cane and doing household chores.  Instability persists with single leg weight bearing activties.  Close supervision for safety.  No give way but visible muscle quivering apparent.  Therapist closely monitoring response.  She reports anterior lower leg pain but intensity is low and quickly dissipates.  Decreasing edema following vasocompression.    Examination-Activity Limitations  Locomotion Level;Bathing;Carry;Stairs;Lift;Stand    Rehab Potential  Good    PT Frequency  2x / week    PT Duration  8 weeks    PT Treatment/Interventions  ADLs/Self Care Home Management;Aquatic Therapy;Cryotherapy;Electrical Stimulation;Neuromuscular re-education;Therapeutic exercise;Therapeutic activities;Stair training;Gait training;Patient/family education;Manual techniques;Dry needling;Taping;Vasopneumatic Device     PT  Next Visit Plan  continue quad muscle activation, hip strength, vaso for edema control; manual therapy to left gastroc/soleus prn.    PT Home Exercise Plan  Access Code: [email or web address]       Patient will benefit from skilled therapeutic intervention in order to improve the following deficits and impairments:  Abnormal gait, Decreased range of motion, Difficulty walking, Pain, Decreased activity tolerance, Impaired perceived functional ability, Increased edema, Decreased strength  Visit Diagnosis: Acute pain of left knee  Muscle weakness (generalized)  Stiffness of left knee, not elsewhere classified  Localized edema     Problem List Patient Active Problem List   Diagnosis Date Noted  . Fracture of tibial shaft, left, closed 07/05/2019  . Acute lateral meniscus tear of left knee 07/05/2019  . Tibial plateau fracture, left 07/05/2019  . Closed bicondylar fracture of left tibial plateau 07/04/2019  . Encounter for antineoplastic chemotherapy 10/27/2017  . 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 Goals of care, counseling/discussion 10/11/2017  . Encounter for smoking cessation counseling 10/11/2017  . S/P lobectomy of lung 09/30/2017   Ruben Im, PT 10/01/19 11:49 AM Phone: [phone] Fax: [phone] Alvera Singh 10/01/2019, 11:48 AM  [facility] [address] [place], [place], [address] Phone: [phone]   Fax:  [phone]  Name: Brandi Crosby MRN: [id number] Date of Birth: 06/11/1956

## 2019-10-05 ENCOUNTER — Ambulatory Visit: Payer: 59 | Admitting: Physical Therapy

## 2019-10-05 ENCOUNTER — Encounter: Payer: Self-pay | Admitting: Physical Therapy

## 2019-10-05 ENCOUNTER — Other Ambulatory Visit: Payer: Self-pay

## 2019-10-05 DIAGNOSIS — M25562 Pain in left knee: Secondary | ICD-10-CM

## 2019-10-05 DIAGNOSIS — R600 Localized edema: Secondary | ICD-10-CM

## 2019-10-05 DIAGNOSIS — M25662 Stiffness of left knee, not elsewhere classified: Secondary | ICD-10-CM

## 2019-10-05 DIAGNOSIS — M6281 Muscle weakness (generalized): Secondary | ICD-10-CM

## 2019-10-05 NOTE — Therapy (Signed)
[facility] [address], [place], [place], [address] Phone: [phone]   Fax:  [phone]  Physical Therapy Treatment  Patient Details  Name: Brandi Crosby MRN: [id number] Date of Birth: 19-Jan-1956 Referring Provider (PT): Dr. Doreatha Martin    Encounter Date: 10/05/2019  PT End of Session - 10/05/19 1153    Visit Number  12    Number of Visits  60    Date for PT Re-Evaluation  10/19/19    Authorization Type  UHC    PT Start Time  6195    PT Stop Time  1242    PT Time Calculation (min)  55 min    Activity Tolerance  Patient tolerated treatment well    Behavior During Therapy  [facility] for tasks assessed/performed       Past Medical History:  Diagnosis Date  . Allergy   . Anxiety   . Depression   . Hyperlipemia   . Lung mass   . SCL ca dx'd 08/2017  . Shingles     Past Surgical History:  Procedure Laterality Date  . DENTAL SURGERY  2010   implant  . NODE DISSECTION Left 09/30/2017   Procedure: NODE DISSECTION;  Surgeon: Grace Isaac, MD;  Location: [place];  Service: Thoracic;  Laterality: Left;  . ORIF TIBIA PLATEAU Left 07/05/2019   Procedure: OPEN REDUCTION INTERNAL FIXATION (ORIF) TIBIAL PLATEAU;  Surgeon: Shona Needles, MD;  Location: [place];  Service: Orthopedics;  Laterality: Left;  . RIGHT OOPHORECTOMY    . TONSILLECTOMY AND ADENOIDECTOMY    . VIDEO ASSISTED THORACOSCOPY (VATS)/WEDGE RESECTION Left 09/30/2017   Procedure: VIDEO ASSISTED THORACOSCOPY (VATS)/LUNG RESECTION;  Surgeon: Grace Isaac, MD;  Location: [place];  Service: Thoracic;  Laterality: Left;  [name] VIDEO BRONCHOSCOPY N/A 09/30/2017   Procedure: VIDEO BRONCHOSCOPY;  Surgeon: Grace Isaac, MD;  Location: [facility] OR;  Service: Thoracic;  Laterality: N/A;  . WISDOM TOOTH EXTRACTION      There were no vitals filed for this visit.  Subjective Assessment - 10/05/19 1155    Subjective  Still having soreness in lower leg with activity.     Pertinent History  healthy;  goes by "Brandi Crosby"; MD in April 27th wearing brace until then    Diagnostic tests  last x-rays looked great ready for WBAT    Patient Stated Goals  get back to normal;  working in yard; playing tennis; get back to walking    Currently in Pain?  Yes    Pain Score  2     Pain Location  Leg    Pain Orientation  Left    Pain Descriptors / Indicators  Sore    Pain Type  Acute pain                       [facility] Adult PT Treatment/Exercise - 10/05/19 0001      Knee/Hip Exercises: Stretches   Gastroc Stretch Limitations  on rocker board 3x20 sec      Knee/Hip Exercises: Aerobic   Recumbent Bike  8 min L1 full revolutions      Knee/Hip Exercises: Machines for Strengthening   Total Gym Leg Press  seat 7 60# 10x; left only 30# 10x       Knee/Hip Exercises: Standing   SLS  standing on left with 5# in left hand doing small range single leg dead lifts; holding on right on railing  x 15  Other Standing Knee Exercises  Right toes on 2 inch step while doing mini squats 10x (forcing more weight on left)     Other Standing Knee Exercises  black foam: eyes closed x 10 sec, open with head turns; looking up and down. Arm movements; mini marching x 10 bil; then slow full march with one UE support prn      Modalities   Modalities  Vasopneumatic      Vasopneumatic   Number Minutes Vasopneumatic   15 minutes    Vasopnuematic Location   Knee    Vasopneumatic Pressure  Medium    Vasopneumatic Temperature   34 deg      Manual Therapy   Manual Therapy  Soft tissue mobilization    Soft tissue mobilization  IASTM and STM to left gastroc/soleus, post tibial muscles on left               PT Short Term Goals - 09/29/19 1320      PT SHORT TERM GOAL #3   Title  The patient will have decreased knee edema with patella circumferential measurement decreased to 36 cm    Baseline  36.5 cm    Status  On-going        PT Long Term Goals - 09/29/19 1319       PT LONG TERM GOAL #1   Title  The patient will be independent with a safe self progession of HEP    Status  On-going      PT LONG TERM GOAL #2   Title  The patient will have left knee ROM 0-125 degrees for greater ease ascending and descending stairs    Baseline  0-120 degrees    Status  On-going      PT LONG TERM GOAL #3   Title  The patient will be able to ambulate with a cane for medium community distances 500 feet needed for work with pain level 3/10 or less    Status  Achieved      PT LONG TERM GOAL #4   Title  The patient will have 4/5 left knee strength needed for ascending and descending stairs    Baseline  MMT has met goal, but functionally knee is unstable with stairs and SLS activities.    Status  Partially Met      PT LONG TERM GOAL #5   Title  FOTO functional outcome score improved from 53% limitation to 30% indicating improved function with less pain    Status  On-going            Plan - 10/05/19 1620    Clinical Impression Statement  Patient continuing to progress with strengthening and stability. She is still c/o pain in left lower leg with WBing activity and responded well to STW/IASTM today with less pain upon standing at end of treatment. Still needs cueing for WBing through LLE with squats.    PT Frequency  2x / week    PT Duration  8 weeks    PT Treatment/Interventions  ADLs/Self Care Home Management;Aquatic Therapy;Cryotherapy;Electrical Stimulation;Neuromuscular re-education;Therapeutic exercise;Therapeutic activities;Stair training;Gait training;Patient/family education;Manual techniques;Dry needling;Taping;Vasopneumatic Device    PT Next Visit Plan  continue quad muscle activation, hip strength, gait; vaso for edema control; manual therapy to left gastroc/soleus prn.    PT Home Exercise Plan  Access Code: [email or web address]    Consulted and Agree with Plan of Care  Patient       Patient will benefit from skilled therapeutic intervention in order to  improve  the following deficits and impairments:  Abnormal gait, Decreased range of motion, Difficulty walking, Pain, Decreased activity tolerance, Impaired perceived functional ability, Increased edema, Decreased strength  Visit Diagnosis: Acute pain of left knee  Muscle weakness (generalized)  Stiffness of left knee, not elsewhere classified  Localized edema     Problem List Patient Active Problem List   Diagnosis Date Noted  . Fracture of tibial shaft, left, closed 07/05/2019  . Acute lateral meniscus tear of left knee 07/05/2019  . Tibial plateau fracture, left 07/05/2019  . Closed bicondylar fracture of left tibial plateau 07/04/2019  . Encounter for antineoplastic chemotherapy 10/27/2017  . 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 Goals of care, counseling/discussion 10/11/2017  . Encounter for smoking cessation counseling 10/11/2017  . S/P lobectomy of lung 09/30/2017    Madelyn Flavors PT 10/05/2019, 4:26 PM  [facility] [address], [place], [place], [address] Phone: [phone]   Fax:  [phone]  Name: Brandi Crosby MRN: [id number] Date of Birth: [birth year]

## 2019-10-07 ENCOUNTER — Other Ambulatory Visit: Payer: Self-pay

## 2019-10-07 ENCOUNTER — Ambulatory Visit: Payer: 59 | Admitting: Physical Therapy

## 2019-10-07 DIAGNOSIS — M25662 Stiffness of left knee, not elsewhere classified: Secondary | ICD-10-CM

## 2019-10-07 DIAGNOSIS — M25562 Pain in left knee: Secondary | ICD-10-CM

## 2019-10-07 DIAGNOSIS — M6281 Muscle weakness (generalized): Secondary | ICD-10-CM

## 2019-10-07 DIAGNOSIS — R600 Localized edema: Secondary | ICD-10-CM

## 2019-10-07 NOTE — Therapy (Signed)
[facility] [address] [place], [place], [address] Phone: [phone]   Fax:  [phone]  Physical Therapy Treatment  Patient Details  Name: Brandi Crosby MRN: [id number] Date of Birth: [birth year] Referring Provider (PT): Dr. Doreatha Martin    Encounter Date: 10/07/2019  PT End of Session - 10/07/19 1911    Visit Number  13    Number of Visits  60    Date for PT Re-Evaluation  10/19/19    Authorization Type  UHC    PT Start Time  1100    PT Stop Time  1150    PT Time Calculation (min)  50 min    Activity Tolerance  Patient tolerated treatment well       Past Medical History:  Diagnosis Date  . Allergy   . Anxiety   . Depression   . Hyperlipemia   . Lung mass   . SCL ca dx'd 08/2017  . Shingles     Past Surgical History:  Procedure Laterality Date  . DENTAL SURGERY  2010   implant  . NODE DISSECTION Left 09/30/2017   Procedure: NODE DISSECTION;  Surgeon: Grace Isaac, MD;  Location: [place];  Service: Thoracic;  Laterality: Left;  . ORIF TIBIA PLATEAU Left 07/05/2019   Procedure: OPEN REDUCTION INTERNAL FIXATION (ORIF) TIBIAL PLATEAU;  Surgeon: Shona Needles, MD;  Location: [place];  Service: Orthopedics;  Laterality: Left;  . RIGHT OOPHORECTOMY    . TONSILLECTOMY AND ADENOIDECTOMY    . VIDEO ASSISTED THORACOSCOPY (VATS)/WEDGE RESECTION Left 09/30/2017   Procedure: VIDEO ASSISTED THORACOSCOPY (VATS)/LUNG RESECTION;  Surgeon: Grace Isaac, MD;  Location: [place];  Service: Thoracic;  Laterality: Left;  [name] VIDEO BRONCHOSCOPY N/A 09/30/2017   Procedure: VIDEO BRONCHOSCOPY;  Surgeon: Grace Isaac, MD;  Location: [facility];  Service: Thoracic;  Laterality: N/A;  . WISDOM TOOTH EXTRACTION      There were no vitals filed for this visit.  Subjective Assessment - 10/07/19 1102    Subjective  States she called the doctor's office regarding medial lower leg pain.  Reports it felt better after manual  therapy last visit.    Pertinent History  healthy;  goes by "Brandi Crosby"; MD in April 27th wearing brace until then    Currently in Pain?  Yes    Pain Score  2     Pain Location  Leg    Pain Orientation  Left;Lower                       [facility] Adult PT Treatment/Exercise - 10/07/19 0001      Knee/Hip Exercises: Aerobic   Recumbent Bike  8 min L1 full revolutions      Knee/Hip Exercises: Standing   Walking with Sports Cord  30# 5x small distance only close CGA    Other Standing Knee Exercises  leaning forward over table with donkey kicks and clams 10x right/left       Knee/Hip Exercises: Seated   Other Seated Knee/Hip Exercises  right foot on black foam with mini squats 10x     Other Seated Knee/Hip Exercises  right foot foam touches with WB on left 10x     Sit to Sand  3 sets;5 reps   holding 8# with and without black foam under feet     Vasopneumatic   Number Minutes Vasopneumatic   15 minutes    Vasopnuematic Location   Knee    Vasopneumatic  Pressure  Medium    Vasopneumatic Temperature   34 deg      Manual Therapy   Soft tissue mobilization  IASTM and STM to left gastroc/soleus, post tibial muscles on left               PT Short Term Goals - 09/29/19 1320      PT SHORT TERM GOAL #3   Title  The patient will have decreased knee edema with patella circumferential measurement decreased to 36 cm    Baseline  36.5 cm    Status  On-going        PT Long Term Goals - 09/29/19 1319      PT LONG TERM GOAL #1   Title  The patient will be independent with a safe self progession of HEP    Status  On-going      PT LONG TERM GOAL #2   Title  The patient will have left knee ROM 0-125 degrees for greater ease ascending and descending stairs    Baseline  0-120 degrees    Status  On-going      PT LONG TERM GOAL #3   Title  The patient will be able to ambulate with a cane for medium community distances 500 feet needed for work with pain level 3/10 or less     Status  Achieved      PT LONG TERM GOAL #4   Title  The patient will have 4/5 left knee strength needed for ascending and descending stairs    Baseline  MMT has met goal, but functionally knee is unstable with stairs and SLS activities.    Status  Partially Met      PT LONG TERM GOAL #5   Title  FOTO functional outcome score improved from 53% limitation to 30% indicating improved function with less pain    Status  On-going            Plan - 10/07/19 1911    Clinical Impression Statement  The patient's primary complaint is left lower leg pain most likely an overuse type tendon inflammation as the result of prolonged gait asymmetry.  She has multiple tender points in posterior tibialis and soleus muscles.  She has a positive response to instrument assisted soft tissue mobilization.  If this issue continues, she may respond well to dry needling of these muscles.  Patient given general info about DN.  Visible quad atrophy medial and lateral quads persists.  Therapist monitoring response with all and cues provided to optimize effectiveness of exercises.    Rehab Potential  Good    PT Frequency  2x / week    PT Duration  8 weeks    PT Treatment/Interventions  ADLs/Self Care Home Management;Aquatic Therapy;Cryotherapy;Electrical Stimulation;Neuromuscular re-education;Therapeutic exercise;Therapeutic activities;Stair training;Gait training;Patient/family education;Manual techniques;Dry needling;Taping;Vasopneumatic Device    PT Next Visit Plan  recheck ROM;  girth measurements for goals;  continue quad muscle activation, hip strength, gait; vaso for edema control; manual therapy to left gastroc/soleus and posterior tibialis, patient considering DN    PT Home Exercise Plan  Access Code: [email or web address]       Patient will benefit from skilled therapeutic intervention in order to improve the following deficits and impairments:     Visit Diagnosis: Acute pain of left knee  Muscle weakness  (generalized)  Stiffness of left knee, not elsewhere classified  Localized edema     Problem List Patient Active Problem List   Diagnosis Date Noted  . Fracture of  tibial shaft, left, closed 07/05/2019  . Acute lateral meniscus tear of left knee 07/05/2019  . Tibial plateau fracture, left 07/05/2019  . Closed bicondylar fracture of left tibial plateau 07/04/2019  . Encounter for antineoplastic chemotherapy 10/27/2017  . 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 Goals of care, counseling/discussion 10/11/2017  . Encounter for smoking cessation counseling 10/11/2017  . S/P lobectomy of lung 09/30/2017   Ruben Im, PT 10/07/19 7:20 PM Phone: [phone] Fax: [phone] Alvera Singh 10/07/2019, 7:19 PM  [facility] [address] [place], [place], [address] Phone: [phone]   Fax:  [phone]  Name: Brandi Crosby MRN: [id number] Date of Birth: Nov 02, 1955

## 2019-10-12 ENCOUNTER — Ambulatory Visit: Payer: 59 | Admitting: Physical Therapy

## 2019-10-14 ENCOUNTER — Ambulatory Visit: Payer: 59 | Admitting: Physical Therapy

## 2019-10-14 ENCOUNTER — Other Ambulatory Visit: Payer: Self-pay

## 2019-10-14 ENCOUNTER — Encounter: Payer: Self-pay | Admitting: Physical Therapy

## 2019-10-14 DIAGNOSIS — M25662 Stiffness of left knee, not elsewhere classified: Secondary | ICD-10-CM

## 2019-10-14 DIAGNOSIS — R600 Localized edema: Secondary | ICD-10-CM

## 2019-10-14 DIAGNOSIS — M6281 Muscle weakness (generalized): Secondary | ICD-10-CM

## 2019-10-14 DIAGNOSIS — M25562 Pain in left knee: Secondary | ICD-10-CM

## 2019-10-14 NOTE — Therapy (Signed)
[facility] [address], [place], [place], [address] Phone: [phone]   Fax:  [phone]  Physical Therapy Treatment  Patient Details  Name: Brandi Crosby MRN: [id number] Date of Birth: Sep 10, 1955 Referring Provider (PT): Dr. Doreatha Martin    Encounter Date: 10/14/2019  PT End of Session - 10/14/19 1401    Visit Number  14    Number of Visits  60    Date for PT Re-Evaluation  10/19/19    Authorization Type  UHC    PT Start Time  1230    PT Stop Time  6010    PT Time Calculation (min)  47 min    Activity Tolerance  Patient tolerated treatment well       Past Medical History:  Diagnosis Date  . Allergy   . Anxiety   . Depression   . Hyperlipemia   . Lung mass   . SCL ca dx'd 08/2017  . Shingles     Past Surgical History:  Procedure Laterality Date  . DENTAL SURGERY  2010   implant  . NODE DISSECTION Left 09/30/2017   Procedure: NODE DISSECTION;  Surgeon: Grace Isaac, MD;  Location: [place];  Service: Thoracic;  Laterality: Left;  . ORIF TIBIA PLATEAU Left 07/05/2019   Procedure: OPEN REDUCTION INTERNAL FIXATION (ORIF) TIBIAL PLATEAU;  Surgeon: Shona Needles, MD;  Location: [place];  Service: Orthopedics;  Laterality: Left;  . RIGHT OOPHORECTOMY    . TONSILLECTOMY AND ADENOIDECTOMY    . VIDEO ASSISTED THORACOSCOPY (VATS)/WEDGE RESECTION Left 09/30/2017   Procedure: VIDEO ASSISTED THORACOSCOPY (VATS)/LUNG RESECTION;  Surgeon: Grace Isaac, MD;  Location: [place];  Service: Thoracic;  Laterality: Left;  [name] VIDEO BRONCHOSCOPY N/A 09/30/2017   Procedure: VIDEO BRONCHOSCOPY;  Surgeon: Grace Isaac, MD;  Location: [facility] OR;  Service: Thoracic;  Laterality: N/A;  . WISDOM TOOTH EXTRACTION      There were no vitals filed for this visit.  Subjective Assessment - 10/14/19 1234    Subjective  The brace is what is making me sore in my lower leg.  I went without it the last couple of days and my lower leg  did not hurt.    Pertinent History  healthy;  goes by "[name]"; MD in April 27th wearing brace until then    Currently in Pain?  No/denies    Pain Score  0-No pain    Pain Orientation  Left    Pain Type  Acute pain         [facility] PT Assessment - 10/14/19 0001      Observation/Other Assessments-Edema    Edema  Circumferential   mid patella 36 cm      AROM   Left Knee Extension  0    Left Knee Flexion  122      Strength   Left Knee Flexion  4+/5    Left Knee Extension  4-/5                   [facility] Adult PT Treatment/Exercise - 10/14/19 0001      Therapeutic Activites    ADL's  up and down steps reciprocally       Knee/Hip Exercises: Aerobic   Recumbent Bike  L2 8 min while discussing progress      Knee/Hip Exercises: Standing   Heel Raises  Both;10 reps    Heel Raises Limitations  holding 10# in hands     Forward Step Up  Left;2 sets;5 reps;Hand Hold: 1;Step Height: 6"    Forward Step Up Limitations  holding 5# weight     Step Down  Left;2 sets;5 reps;Hand Hold: 2;Step Height: 2"    Other Standing Knee Exercises  single leg partial dead lift 2 sets of 5       Vasopneumatic   Number Minutes Vasopneumatic   10 minutes    Vasopnuematic Location   Knee    Vasopneumatic Pressure  Medium    Vasopneumatic Temperature   34 deg      Manual Therapy   Soft tissue mobilization  IASTM and STM to left gastroc/soleus, post tibial muscles on left               PT Short Term Goals - 10/14/19 1357      PT SHORT TERM GOAL #1   Title  The patient will demonstrate knowlege of basic self care and ex's to promote ROM and healing    Status  Achieved      PT SHORT TERM GOAL #2   Title  Left knee flexion to 100 degrees needed for greater ease in/out of the car and with sit to stand    Status  Achieved      PT SHORT TERM GOAL #3   Title  The patient will have decreased knee edema with patella circumferential measurement decreased to 36 cm    Status  Achieved       PT SHORT TERM GOAL #4   Title  The patient will be able to ambulate > 300 feet for short to medium community distances    Status  Achieved      PT SHORT TERM GOAL #5   Title  The patient will be able to ascend/descend steps one at a time independently    Status  Achieved        PT Long Term Goals - 10/14/19 1357      PT LONG TERM GOAL #1   Title  The patient will be independent with a safe self progession of HEP    Time  8    Period  Weeks    Status  On-going      PT LONG TERM GOAL #2   Title  The patient will have left knee ROM 0-125 degrees for greater ease ascending and descending stairs    Time  8    Period  Weeks    Status  On-going      PT LONG TERM GOAL #3   Title  The patient will be able to ambulate with a cane for medium community distances 500 feet needed for work with pain level 3/10 or less    Status  Achieved      PT LONG TERM GOAL #4   Title  The patient will have 4/5 left knee strength needed for ascending and descending stairs    Status  Achieved      PT LONG TERM GOAL #5   Title  FOTO functional outcome score improved from 53% limitation to 30% indicating improved function with less pain    Time  8    Period  Weeks    Status  On-going            Plan - 10/14/19 1351    Clinical Impression Statement  The patient continues to progress well with knee ROM (0-122 degrees actively).  She is able to do up and down steps reciprocally but lacks quad strength and motor control to  descend steps without moderate UE assist of railings.  She has visible muscle quivering with WB on affected leg for only 10 sec.  Edema continues to decrease especially peri-patellar region.  She has had lower leg pain particularly in posterior tib muscle which has responded well to myofacial manual therapy.  She is progressing well with functional improvements but would benefit from additional PT with focus on strengthening especially when she is allowed to transition out of the  brace.    Stability/Clinical Decision Making  Stable/Uncomplicated    Rehab Potential  Good    PT Frequency  2x / week    PT Duration  8 weeks    PT Treatment/Interventions  ADLs/Self Care Home Management;Aquatic Therapy;Cryotherapy;Electrical Stimulation;Neuromuscular re-education;Therapeutic exercise;Therapeutic activities;Stair training;Gait training;Patient/family education;Manual techniques;Dry needling;Taping;Vasopneumatic Device    PT Next Visit Plan  ERO next visit; see how MD appt went; quad strengthening;  manual to posterior tib as needed;  vaso for edema control    PT Home Exercise Plan  Access Code: [email or web address]       Patient will benefit from skilled therapeutic intervention in order to improve the following deficits and impairments:  Abnormal gait, Decreased range of motion, Difficulty walking, Pain, Decreased activity tolerance, Impaired perceived functional ability, Increased edema, Decreased strength  Visit Diagnosis: Acute pain of left knee  Muscle weakness (generalized)  Stiffness of left knee, not elsewhere classified  Localized edema     Problem List Patient Active Problem List   Diagnosis Date Noted  . Fracture of tibial shaft, left, closed 07/05/2019  . Acute lateral meniscus tear of left knee 07/05/2019  . Tibial plateau fracture, left 07/05/2019  . Closed bicondylar fracture of left tibial plateau 07/04/2019  . Encounter for antineoplastic chemotherapy 10/27/2017  . 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 Goals of care, counseling/discussion 10/11/2017  . Encounter for smoking cessation counseling 10/11/2017  . S/P lobectomy of lung 09/30/2017   Ruben Im, PT 10/14/19 2:02 PM Phone: [phone] Fax: [phone] Alvera Singh 10/14/2019, 2:02 PM  [facility] [address], [place], [place], [address] Phone: [phone]   Fax:  [phone]  Name:  Brandi Crosby MRN: [id number] Date of Birth: 1955/09/18

## 2019-10-19 ENCOUNTER — Ambulatory Visit: Payer: 59 | Admitting: Physical Therapy

## 2019-10-19 ENCOUNTER — Other Ambulatory Visit: Payer: Self-pay

## 2019-10-19 DIAGNOSIS — M25562 Pain in left knee: Secondary | ICD-10-CM

## 2019-10-19 DIAGNOSIS — M25662 Stiffness of left knee, not elsewhere classified: Secondary | ICD-10-CM

## 2019-10-19 DIAGNOSIS — R600 Localized edema: Secondary | ICD-10-CM

## 2019-10-19 DIAGNOSIS — M6281 Muscle weakness (generalized): Secondary | ICD-10-CM

## 2019-10-19 NOTE — Therapy (Signed)
[facility] [address], [place], [place], [address] Phone: [phone]   Fax:  [phone]  Physical Therapy Treatment/Recertification   Patient Details  Name: Brandi Crosby MRN: [id number] Date of Birth: 1956-06-01 Referring Provider (PT): Dr. Doreatha Martin    Encounter Date: 10/19/2019  PT End of Session - 10/19/19 1603    Visit Number  15    Number of Visits  60    Date for PT Re-Evaluation  12/14/19    Authorization Type  UHC    PT Start Time  1230    PT Stop Time  1318    PT Time Calculation (min)  48 min    Activity Tolerance  Patient tolerated treatment well       Past Medical History:  Diagnosis Date  . Allergy   . Anxiety   . Depression   . Hyperlipemia   . Lung mass   . SCL ca dx'd 08/2017  . Shingles     Past Surgical History:  Procedure Laterality Date  . DENTAL SURGERY  2010   implant  . NODE DISSECTION Left 09/30/2017   Procedure: NODE DISSECTION;  Surgeon: Grace Isaac, MD;  Location: [place];  Service: Thoracic;  Laterality: Left;  . ORIF TIBIA PLATEAU Left 07/05/2019   Procedure: OPEN REDUCTION INTERNAL FIXATION (ORIF) TIBIAL PLATEAU;  Surgeon: Shona Needles, MD;  Location: [place];  Service: Orthopedics;  Laterality: Left;  . RIGHT OOPHORECTOMY    . TONSILLECTOMY AND ADENOIDECTOMY    . VIDEO ASSISTED THORACOSCOPY (VATS)/WEDGE RESECTION Left 09/30/2017   Procedure: VIDEO ASSISTED THORACOSCOPY (VATS)/LUNG RESECTION;  Surgeon: Grace Isaac, MD;  Location: [place];  Service: Thoracic;  Laterality: Left;  [name] VIDEO BRONCHOSCOPY N/A 09/30/2017   Procedure: VIDEO BRONCHOSCOPY;  Surgeon: Grace Isaac, MD;  Location: [facility] OR;  Service: Thoracic;  Laterality: N/A;  . WISDOM TOOTH EXTRACTION      There were no vitals filed for this visit.  Subjective Assessment - 10/19/19 1229    Subjective  The doctor said I could get rid of the brace.  X-rays were good.  He said I'm about 2 months out  on more improvement.    Pertinent History  healthy;  goes by "Brandi Crosby"; MD in April 27th wearing brace until then    Currently in Pain?  No/denies    Pain Score  0-No pain         [facility] PT Assessment - 10/19/19 0001      Observation/Other Assessments   Focus on Therapeutic Outcomes (FOTO)   39% limitation       Observation/Other Assessments-Edema    Edema  Circumferential   mid patella 36 cm      AROM   Left Knee Extension  0    Left Knee Flexion  128      Strength   Left Knee Flexion  4+/5    Left Knee Extension  4-/5                   [facility] Adult PT Treatment/Exercise - 10/19/19 0001      Knee/Hip Exercises: Stretches   Active Hamstring Stretch  Left;5 reps    Active Hamstring Stretch Limitations  on 2nd step     Other Knee/Hip Stretches  2nd step ROM 20x       Knee/Hip Exercises: Aerobic   Recumbent Bike  L2 8 min while discussing progress      Knee/Hip Exercises: Standing  Abduction Limitations  green band side step 5x right/left     Rocker Board  2 minutes    Other Standing Knee Exercises  wall sits with left TKEs red band     Other Standing Knee Exercises  floor slider arcs 10x       Knee/Hip Exercises: Seated   Sit to Sand  15 reps   with mirror feedback to avoid genu valgus     Vasopneumatic   Number Minutes Vasopneumatic   10 minutes    Vasopnuematic Location   Knee    Vasopneumatic Pressure  Medium    Vasopneumatic Temperature   34 deg      Manual Therapy   Soft tissue mobilization  IASTM and STM to left gastroc/soleus, post tibial muscles on left               PT Short Term Goals - 10/19/19 1611      PT SHORT TERM GOAL #1   Title  The patient will demonstrate knowlege of basic self care and ex's to promote ROM and healing    Status  Achieved      PT SHORT TERM GOAL #2   Title  Left knee flexion to 100 degrees needed for greater ease in/out of the car and with sit to stand    Status  Achieved      PT SHORT TERM GOAL #3    Title  The patient will have decreased knee edema with patella circumferential measurement decreased to 36 cm    Status  Achieved      PT SHORT TERM GOAL #4   Title  The patient will be able to ambulate > 300 feet for short to medium community distances    Status  Achieved      PT SHORT TERM GOAL #5   Title  The patient will be able to ascend/descend steps one at a time independently    Status  Achieved        PT Long Term Goals - 10/19/19 1612      PT LONG TERM GOAL #1   Title  The patient will be independent with a safe self progession of HEP    Time  8    Period  Weeks    Status  On-going    Target Date  12/14/19      PT LONG TERM GOAL #2   Title  The patient will have left knee ROM 0-132 degrees for greater ease ascending and descending stairs    Time  8    Period  Weeks    Status  Revised      PT LONG TERM GOAL #3   Title  The patient will be able to ambulate >1/4 mile needed for work with pain level 2/10 or less    Time  8    Period  Weeks    Status  Revised      PT LONG TERM GOAL #4   Title  The patient will have 4+/5 left knee strength needed for ascending and descending stairs and walking/standing longer periods of time    Time  8    Period  Weeks    Status  Revised      PT LONG TERM GOAL #5   Title  FOTO functional outcome score improved from 53% limitation to 30% indicating improved function with less pain    Time  8    Period  Weeks    Status  On-going  Plan - 10/19/19 1604    Clinical Impression Statement  The patient returns after her MD follow up with report of good bone healing per x-ray and OK to discontinue use of her brace.  She is able to participate in advancement of LE strengthening ex's although she continues to fatigue rather quickly with increased weight bearing ex.  She needs verbal and visual feedback with the mirror to avoid knee valgus positioning in standing and with sit to stand.  Her ROM has signficantly improved to  0-128 degrees.  She has not attempted to walk longer distances and uses her cane for medium distances and work.  She has needed recent manual therapy secondary to lower leg/posterior tib myofascial tender points.  Mild peripatellar swelling persists although decreasing circumferential measurements.  She would benefit from continued PT to address these remaining deficits.    Rehab Potential  Good    PT Frequency  2x / week    PT Duration  8 weeks    PT Treatment/Interventions  ADLs/Self Care Home Management;Aquatic Therapy;Cryotherapy;Electrical Stimulation;Neuromuscular re-education;Therapeutic exercise;Therapeutic activities;Stair training;Gait training;Patient/family education;Manual techniques;Dry needling;Taping;Vasopneumatic Device    PT Next Visit Plan  quad strengthening progression  manual to posterior tib as needed;  vaso for edema control    PT Home Exercise Plan  Access Code: [email or web address]       Patient will benefit from skilled therapeutic intervention in order to improve the following deficits and impairments:  Abnormal gait, Decreased range of motion, Difficulty walking, Pain, Decreased activity tolerance, Impaired perceived functional ability, Increased edema, Decreased strength  Visit Diagnosis: Acute pain of left knee - Plan: PT plan of care cert/re-cert  Muscle weakness (generalized) - Plan: PT plan of care cert/re-cert  Stiffness of left knee, not elsewhere classified - Plan: PT plan of care cert/re-cert  Localized edema - Plan: PT plan of care cert/re-cert     Problem List Patient Active Problem List   Diagnosis Date Noted  . Fracture of tibial shaft, left, closed 07/05/2019  . Acute lateral meniscus tear of left knee 07/05/2019  . Tibial plateau fracture, left 07/05/2019  . Closed bicondylar fracture of left tibial plateau 07/04/2019  . Encounter for antineoplastic chemotherapy 10/27/2017  . 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 Goals of  care, counseling/discussion 10/11/2017  . Encounter for smoking cessation counseling 10/11/2017  . S/P lobectomy of lung 09/30/2017   Ruben Im, PT 10/19/19 5:11 PM Phone: [phone] Fax: [phone] Alvera Singh 10/19/2019, 5:10 PM  [facility] [address] [place], [place], [address] Phone: [phone]   Fax:  [phone]  Name: Brandi Crosby MRN: [id number] Date of Birth: April 20, 1956

## 2019-10-21 ENCOUNTER — Other Ambulatory Visit: Payer: Self-pay

## 2019-10-21 ENCOUNTER — Ambulatory Visit: Payer: 59 | Admitting: Physical Therapy

## 2019-10-21 DIAGNOSIS — M6281 Muscle weakness (generalized): Secondary | ICD-10-CM

## 2019-10-21 DIAGNOSIS — M25562 Pain in left knee: Secondary | ICD-10-CM

## 2019-10-21 DIAGNOSIS — M25662 Stiffness of left knee, not elsewhere classified: Secondary | ICD-10-CM

## 2019-10-21 DIAGNOSIS — R600 Localized edema: Secondary | ICD-10-CM

## 2019-10-21 NOTE — Therapy (Signed)
[facility] [address], [place], [place], [address] Phone: [phone]   Fax:  [phone]  Physical Therapy Treatment  Patient Details  Name: Brandi Crosby MRN: [id number] Date of Birth: 01-Jan-1956 Referring Provider (PT): Dr. Doreatha Martin    Encounter Date: 10/21/2019  PT End of Session - 10/21/19 1308    Visit Number  16    Number of Visits  60    Date for PT Re-Evaluation  12/14/19    Authorization Type  [employer]    PT Start Time  1230    PT Stop Time  1315    PT Time Calculation (min)  45 min    Activity Tolerance  Patient tolerated treatment well       Past Medical History:  Diagnosis Date  . Allergy   . Anxiety   . Depression   . Hyperlipemia   . Lung mass   . SCL ca dx'd 08/2017  . Shingles     Past Surgical History:  Procedure Laterality Date  . DENTAL SURGERY  2010   implant  . NODE DISSECTION Left 09/30/2017   Procedure: NODE DISSECTION;  Surgeon: Grace Isaac, MD;  Location: [place];  Service: Thoracic;  Laterality: Left;  . ORIF TIBIA PLATEAU Left 07/05/2019   Procedure: OPEN REDUCTION INTERNAL FIXATION (ORIF) TIBIAL PLATEAU;  Surgeon: Shona Needles, MD;  Location: [place];  Service: Orthopedics;  Laterality: Left;  . RIGHT OOPHORECTOMY    . TONSILLECTOMY AND ADENOIDECTOMY    . VIDEO ASSISTED THORACOSCOPY (VATS)/WEDGE RESECTION Left 09/30/2017   Procedure: VIDEO ASSISTED THORACOSCOPY (VATS)/LUNG RESECTION;  Surgeon: Grace Isaac, MD;  Location: [place];  Service: Thoracic;  Laterality: Left;  [name] VIDEO BRONCHOSCOPY N/A 09/30/2017   Procedure: VIDEO BRONCHOSCOPY;  Surgeon: Grace Isaac, MD;  Location: [facility] OR;  Service: Thoracic;  Laterality: N/A;  . WISDOM TOOTH EXTRACTION      There were no vitals filed for this visit.  Subjective Assessment - 10/21/19 1229    Subjective  I drove myself today.  I walked further than I have been, about 1/2 mile.  My left leg feels shorter than my  right.    Currently in Pain?  No/denies    Pain Score  0-No pain    Pain Orientation  Left    Pain Type  Acute pain                       [facility] Adult PT Treatment/Exercise - 10/21/19 0001      Knee/Hip Exercises: Aerobic   Recumbent Bike  L3 6 min while discussing progress      Knee/Hip Exercises: Machines for Strengthening   Total Gym Leg Press  seat 7 70# 15x; left only 30# 15x      Knee/Hip Exercises: Standing   Forward Lunges Limitations  static lunges 2x 5 most weight on left     Gait Training  ladder walk high step, side step with crouch, in/out of boxes 2 laps each     Other Standing Knee Exercises  carry 7# weight 4 laps in right/left hand and up and down stairs     Other Standing Knee Exercises  double blue band hip abduction and  diagonal extension 5x right/left       Knee/Hip Exercises: Sidelying   Hip ABduction  Left;15 reps    Clams  right clams with left isometric push into mat 15x  Vasopneumatic   Number Minutes Vasopneumatic   10 minutes    Vasopnuematic Location   Knee    Vasopneumatic Pressure  Medium    Vasopneumatic Temperature   34 deg               PT Short Term Goals - 10/19/19 1611      PT SHORT TERM GOAL #1   Title  The patient will demonstrate knowlege of basic self care and ex's to promote ROM and healing    Status  Achieved      PT SHORT TERM GOAL #2   Title  Left knee flexion to 100 degrees needed for greater ease in/out of the car and with sit to stand    Status  Achieved      PT SHORT TERM GOAL #3   Title  The patient will have decreased knee edema with patella circumferential measurement decreased to 36 cm    Status  Achieved      PT SHORT TERM GOAL #4   Title  The patient will be able to ambulate > 300 feet for short to medium community distances    Status  Achieved      PT SHORT TERM GOAL #5   Title  The patient will be able to ascend/descend steps one at a time independently    Status  Achieved         PT Long Term Goals - 10/19/19 1612      PT LONG TERM GOAL #1   Title  The patient will be independent with a safe self progession of HEP    Time  8    Period  Weeks    Status  On-going    Target Date  12/14/19      PT LONG TERM GOAL #2   Title  The patient will have left knee ROM 0-132 degrees for greater ease ascending and descending stairs    Time  8    Period  Weeks    Status  Revised      PT LONG TERM GOAL #3   Title  The patient will be able to ambulate >1/4 mile needed for work with pain level 2/10 or less    Time  8    Period  Weeks    Status  Revised      PT LONG TERM GOAL #4   Title  The patient will have 4+/5 left knee strength needed for ascending and descending stairs and walking/standing longer periods of time    Time  8    Period  Weeks    Status  Revised      PT LONG TERM GOAL #5   Title  FOTO functional outcome score improved from 53% limitation to 30% indicating improved function with less pain    Time  8    Period  Weeks    Status  On-going            Plan - 10/21/19 1308    Clinical Impression Statement  The patient is able to perform more challenging strengthening of left LE and the addition of low level agility ex's as she is less dependent on her cane.   She does have visible muscle trembling with fatigue.  She has better awareness of patellofemoral alignment with fewer cues needed.  Tactile cues to activate glute meds in sidelying.    Rehab Potential  Good    PT Frequency  2x / week    PT Duration  8 weeks    PT Treatment/Interventions  ADLs/Self Care Home Management;Aquatic Therapy;Cryotherapy;Electrical Stimulation;Neuromuscular re-education;Therapeutic exercise;Therapeutic activities;Stair training;Gait training;Patient/family education;Manual techniques;Dry needling;Taping;Vasopneumatic Device    PT Next Visit Plan  quad strengthening progression  manual to posterior tib as needed;  vaso for edema control    PT Home Exercise Plan   Access Code: [email or web address]       Patient will benefit from skilled therapeutic intervention in order to improve the following deficits and impairments:  Abnormal gait, Decreased range of motion, Difficulty walking, Pain, Decreased activity tolerance, Impaired perceived functional ability, Increased edema, Decreased strength  Visit Diagnosis: Muscle weakness (generalized)  Acute pain of left knee  Stiffness of left knee, not elsewhere classified  Localized edema     Problem List Patient Active Problem List   Diagnosis Date Noted  . Fracture of tibial shaft, left, closed 07/05/2019  . Acute lateral meniscus tear of left knee 07/05/2019  . Tibial plateau fracture, left 07/05/2019  . Closed bicondylar fracture of left tibial plateau 07/04/2019  . Encounter for antineoplastic chemotherapy 10/27/2017  . 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 Goals of care, counseling/discussion 10/11/2017  . Encounter for smoking cessation counseling 10/11/2017  . S/P lobectomy of lung 09/30/2017   Ruben Im, PT 10/21/19 1:17 PM Phone: [phone] Fax: [phone] Alvera Singh 10/21/2019, 1:17 PM  [facility] [address] [place], [place], [address] Phone: [phone]   Fax:  [phone]  Name: Brandi Crosby MRN: [id number] Date of Birth: Dec 28, 1955

## 2019-10-26 ENCOUNTER — Other Ambulatory Visit: Payer: Self-pay

## 2019-10-26 ENCOUNTER — Ambulatory Visit: Payer: 59 | Attending: Student | Admitting: Physical Therapy

## 2019-10-26 DIAGNOSIS — M25562 Pain in left knee: Secondary | ICD-10-CM | POA: Diagnosis present

## 2019-10-26 DIAGNOSIS — M6281 Muscle weakness (generalized): Secondary | ICD-10-CM

## 2019-10-26 DIAGNOSIS — R600 Localized edema: Secondary | ICD-10-CM

## 2019-10-26 DIAGNOSIS — M25662 Stiffness of left knee, not elsewhere classified: Secondary | ICD-10-CM

## 2019-10-26 NOTE — Therapy (Signed)
[facility] [address], [place], [place], [address] Phone: [phone]   Fax:  [phone]  Physical Therapy Treatment  Patient Details  Name: Brandi Crosby MRN: [id number] Date of Birth: [birth year] Referring Provider (PT): Dr. Doreatha Martin    Encounter Date: 10/26/2019  PT End of Session - 10/26/19 1312    Visit Number  17    Number of Visits  60    Date for PT Re-Evaluation  12/14/19    PT Start Time  6962    PT Stop Time  1318    PT Time Calculation (min)  43 min    Activity Tolerance  Patient tolerated treatment well       Past Medical History:  Diagnosis Date  . Allergy   . Anxiety   . Depression   . Hyperlipemia   . Lung mass   . SCL ca dx'd 08/2017  . Shingles     Past Surgical History:  Procedure Laterality Date  . DENTAL SURGERY  2010   implant  . NODE DISSECTION Left 09/30/2017   Procedure: NODE DISSECTION;  Surgeon: Grace Isaac, MD;  Location: [place];  Service: Thoracic;  Laterality: Left;  . ORIF TIBIA PLATEAU Left 07/05/2019   Procedure: OPEN REDUCTION INTERNAL FIXATION (ORIF) TIBIAL PLATEAU;  Surgeon: Shona Needles, MD;  Location: [place];  Service: Orthopedics;  Laterality: Left;  . RIGHT OOPHORECTOMY    . TONSILLECTOMY AND ADENOIDECTOMY    . VIDEO ASSISTED THORACOSCOPY (VATS)/WEDGE RESECTION Left 09/30/2017   Procedure: VIDEO ASSISTED THORACOSCOPY (VATS)/LUNG RESECTION;  Surgeon: Grace Isaac, MD;  Location: [place];  Service: Thoracic;  Laterality: Left;  [name] VIDEO BRONCHOSCOPY N/A 09/30/2017   Procedure: VIDEO BRONCHOSCOPY;  Surgeon: Grace Isaac, MD;  Location: [facility] OR;  Service: Thoracic;  Laterality: N/A;  . WISDOM TOOTH EXTRACTION      There were no vitals filed for this visit.  Subjective Assessment - 10/26/19 1235    Subjective  Presents without cane today.  Worked it hard yesterday but it's fine today.  Took a bath this morning.  No difficulty getting in/out of the tub  but I'm careful.    Pertinent History  healthy;  goes by "[name]"    Currently in Pain?  No/denies    Pain Score  0-No pain    Pain Location  Knee    Pain Orientation  Left                       [facility] Adult PT Treatment/Exercise - 10/26/19 0001      Knee/Hip Exercises: Stretches   Active Hamstring Stretch  Left;5 reps    Active Hamstring Stretch Limitations  on 2nd step     Gastroc Stretch Limitations  slant board gastroc stretch 3x 20 sec     Other Knee/Hip Stretches  2nd step ROM 20x       Knee/Hip Exercises: Machines for Strengthening   Cybex Knee Flexion  15# bil 10x; left only 10x     Total Gym Leg Press  seat 7 30# left only 3x 10      Knee/Hip Exercises: Standing   SLS  kickstand position with tennis ball bounce 1 minute; same position ball toss 7x     Gait Training  high step, side step butt kicks, grapevine     Other Standing Knee Exercises  BAPS L2 12/6 and circles each way    Other Standing Knee Exercises  single leg partial dead lifts, right foot on wall 2 sets of 5      Knee/Hip Exercises: Sidelying   Hip ABduction  Strengthening;Left;15 reps    Hip ABduction Limitations  3#      Vasopneumatic   Number Minutes Vasopneumatic   10 minutes    Vasopnuematic Location   Knee    Vasopneumatic Pressure  Medium    Vasopneumatic Temperature   34 deg               PT Short Term Goals - 10/19/19 1611      PT SHORT TERM GOAL #1   Title  The patient will demonstrate knowlege of basic self care and ex's to promote ROM and healing    Status  Achieved      PT SHORT TERM GOAL #2   Title  Left knee flexion to 100 degrees needed for greater ease in/out of the car and with sit to stand    Status  Achieved      PT SHORT TERM GOAL #3   Title  The patient will have decreased knee edema with patella circumferential measurement decreased to 36 cm    Status  Achieved      PT SHORT TERM GOAL #4   Title  The patient will be able to ambulate > 300 feet for  short to medium community distances    Status  Achieved      PT SHORT TERM GOAL #5   Title  The patient will be able to ascend/descend steps one at a time independently    Status  Achieved        PT Long Term Goals - 10/19/19 1612      PT LONG TERM GOAL #1   Title  The patient will be independent with a safe self progession of HEP    Time  8    Period  Weeks    Status  On-going    Target Date  12/14/19      PT LONG TERM GOAL #2   Title  The patient will have left knee ROM 0-132 degrees for greater ease ascending and descending stairs    Time  8    Period  Weeks    Status  Revised      PT LONG TERM GOAL #3   Title  The patient will be able to ambulate >1/4 mile needed for work with pain level 2/10 or less    Time  8    Period  Weeks    Status  Revised      PT LONG TERM GOAL #4   Title  The patient will have 4+/5 left knee strength needed for ascending and descending stairs and walking/standing longer periods of time    Time  8    Period  Weeks    Status  Revised      PT LONG TERM GOAL #5   Title  FOTO functional outcome score improved from 53% limitation to 30% indicating improved function with less pain    Time  8    Period  Weeks    Status  On-going            Plan - 10/26/19 1313    Clinical Impression Statement  The patient is progressing with functional strengthening of left LE.  She has visible muscle trembling with 75% full weight bearing exercises.  No pain but muscular fatigue reported.   Genu valgus still present but less prominent.  Therapist monitoring response with all interventions.    Examination-Activity Limitations  Locomotion Level;Bathing;Carry;Stairs;Lift;Stand    Examination-Participation Restrictions  Community Activity;Driving;Yard Work;Other;Shop    Rehab Potential  Good    PT Frequency  2x / week    PT Duration  8 weeks    PT Treatment/Interventions  ADLs/Self Care Home Management;Aquatic Therapy;Cryotherapy;Electrical  Stimulation;Neuromuscular re-education;Therapeutic exercise;Therapeutic activities;Stair training;Gait training;Patient/family education;Manual techniques;Dry needling;Taping;Vasopneumatic Device    PT Next Visit Plan  quad strengthening progression  manual to posterior tib as needed;  vaso for edema control    PT Home Exercise Plan  Access Code: [email or web address]       Patient will benefit from skilled therapeutic intervention in order to improve the following deficits and impairments:  Abnormal gait, Decreased range of motion, Difficulty walking, Pain, Decreased activity tolerance, Impaired perceived functional ability, Increased edema, Decreased strength  Visit Diagnosis: Muscle weakness (generalized)  Acute pain of left knee  Stiffness of left knee, not elsewhere classified  Localized edema     Problem List Patient Active Problem List   Diagnosis Date Noted  . Fracture of tibial shaft, left, closed 07/05/2019  . Acute lateral meniscus tear of left knee 07/05/2019  . Tibial plateau fracture, left 07/05/2019  . Closed bicondylar fracture of left tibial plateau 07/04/2019  . Encounter for antineoplastic chemotherapy 10/27/2017  . 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 Goals of care, counseling/discussion 10/11/2017  . Encounter for smoking cessation counseling 10/11/2017  . S/P lobectomy of lung 09/30/2017   Ruben Im, PT 10/26/19 1:26 PM Phone: [phone] Fax: [phone] Alvera Singh 10/26/2019, 1:26 PM  [facility] [address], [place], [place], [address] Phone: [phone]   Fax:  [phone]  Name: Nylia Gavina MRN: [id number] Date of Birth: 02-Oct-1955

## 2019-10-28 ENCOUNTER — Other Ambulatory Visit: Payer: Self-pay

## 2019-10-28 ENCOUNTER — Encounter: Payer: Self-pay | Admitting: Physical Therapy

## 2019-10-28 ENCOUNTER — Ambulatory Visit: Payer: 59 | Admitting: Physical Therapy

## 2019-10-28 DIAGNOSIS — R600 Localized edema: Secondary | ICD-10-CM

## 2019-10-28 DIAGNOSIS — M6281 Muscle weakness (generalized): Secondary | ICD-10-CM | POA: Diagnosis not present

## 2019-10-28 DIAGNOSIS — M25662 Stiffness of left knee, not elsewhere classified: Secondary | ICD-10-CM

## 2019-10-28 DIAGNOSIS — M25562 Pain in left knee: Secondary | ICD-10-CM

## 2019-10-28 NOTE — Therapy (Signed)
[facility] [address], [place], [place], [address] Phone: [phone]   Fax:  [phone]  Physical Therapy Treatment  Patient Details  Name: Brandi Crosby MRN: [id number] Date of Birth: [birth year] Referring Provider (PT): Dr. Doreatha Martin    Encounter Date: 10/28/2019  PT End of Session - 10/28/19 1315    Visit Number  18    Number of Visits  60    Date for PT Re-Evaluation  12/14/19    Authorization Type  [employer]HC    PT Start Time  1230    PT Stop Time  1320    PT Time Calculation (min)  50 min    Activity Tolerance  Patient tolerated treatment well       Past Medical History:  Diagnosis Date  . Allergy   . Anxiety   . Depression   . Hyperlipemia   . Lung mass   . SCL ca dx'd 08/2017  . Shingles     Past Surgical History:  Procedure Laterality Date  . DENTAL SURGERY  2010   implant  . NODE DISSECTION Left 09/30/2017   Procedure: NODE DISSECTION;  Surgeon: Grace Isaac, MD;  Location: [place];  Service: Thoracic;  Laterality: Left;  . ORIF TIBIA PLATEAU Left 07/05/2019   Procedure: OPEN REDUCTION INTERNAL FIXATION (ORIF) TIBIAL PLATEAU;  Surgeon: Shona Needles, MD;  Location: [place];  Service: Orthopedics;  Laterality: Left;  . RIGHT OOPHORECTOMY    . TONSILLECTOMY AND ADENOIDECTOMY    . VIDEO ASSISTED THORACOSCOPY (VATS)/WEDGE RESECTION Left 09/30/2017   Procedure: VIDEO ASSISTED THORACOSCOPY (VATS)/LUNG RESECTION;  Surgeon: Grace Isaac, MD;  Location: [place];  Service: Thoracic;  Laterality: Left;  [name] VIDEO BRONCHOSCOPY N/A 09/30/2017   Procedure: VIDEO BRONCHOSCOPY;  Surgeon: Grace Isaac, MD;  Location: [facility] OR;  Service: Thoracic;  Laterality: N/A;  . WISDOM TOOTH EXTRACTION      There were no vitals filed for this visit.  Subjective Assessment - 10/28/19 1230    Subjective  Presents without cane.  I did a lot of stairs overnight when my dog wet the bed.  I walked a quarter of a mile  without my cane.  I'm very sore today.  Take it easy on me today.    Currently in Pain?  Yes    Pain Score  4     Pain Location  Knee    Pain Orientation  Left                       [facility] Adult PT Treatment/Exercise - 10/28/19 0001      Knee/Hip Exercises: Stretches   Other Knee/Hip Stretches  2nd step ROM 20x       Knee/Hip Exercises: Aerobic   Recumbent Bike  8 min L2 full revolutions      Knee/Hip Exercises: Standing   Heel Raises  Both;20 reps    SLS with Vectors  standing on black foam 10x 3 ways    Gait Training  single leg rotation with slight bend of knee 2x 5     Other Standing Knee Exercises  4 inch posterior step downs 5x    Other Standing Knee Exercises  windmills touching bottom step 5x       Knee/Hip Exercises: Seated   Long Arc Quad  Strengthening;Left;20 reps;Weights    Long Arc Quad Weight  2 lbs.      Knee/Hip Exercises: Supine   Bridges  20  reps    Bridges Limitations  holding 10# on pelvis       Knee/Hip Exercises: Prone   Hamstring Curl  15 reps    Hamstring Curl Limitations  2#     Hip Extension  Strengthening;Right;Left;10 reps    Hip Extension Limitations  2#       Vasopneumatic   Number Minutes Vasopneumatic   10 minutes    Vasopnuematic Location   Knee    Vasopneumatic Pressure  Medium    Vasopneumatic Temperature   34 deg      Manual Therapy   Soft tissue mobilization  IASTM and STM to left gastroc/soleus, post tibial muscles on left               PT Short Term Goals - 10/19/19 1611      PT SHORT TERM GOAL #1   Title  The patient will demonstrate knowlege of basic self care and ex's to promote ROM and healing    Status  Achieved      PT SHORT TERM GOAL #2   Title  Left knee flexion to 100 degrees needed for greater ease in/out of the car and with sit to stand    Status  Achieved      PT SHORT TERM GOAL #3   Title  The patient will have decreased knee edema with patella circumferential measurement decreased to  36 cm    Status  Achieved      PT SHORT TERM GOAL #4   Title  The patient will be able to ambulate > 300 feet for short to medium community distances    Status  Achieved      PT SHORT TERM GOAL #5   Title  The patient will be able to ascend/descend steps one at a time independently    Status  Achieved        PT Long Term Goals - 10/19/19 1612      PT LONG TERM GOAL #1   Title  The patient will be independent with a safe self progession of HEP    Time  8    Period  Weeks    Status  On-going    Target Date  12/14/19      PT LONG TERM GOAL #2   Title  The patient will have left knee ROM 0-132 degrees for greater ease ascending and descending stairs    Time  8    Period  Weeks    Status  Revised      PT LONG TERM GOAL #3   Title  The patient will be able to ambulate >1/4 mile needed for work with pain level 2/10 or less    Time  8    Period  Weeks    Status  Revised      PT LONG TERM GOAL #4   Title  The patient will have 4+/5 left knee strength needed for ascending and descending stairs and walking/standing longer periods of time    Time  8    Period  Weeks    Status  Revised      PT LONG TERM GOAL #5   Title  FOTO functional outcome score improved from 53% limitation to 30% indicating improved function with less pain    Time  8    Period  Weeks    Status  On-going            Plan - 10/28/19 1316    Clinical Impression  Statement  Despite increased soreness associated with more home activities, patient is able to participate in continued left LE strengthening.  She reports muscular fatigue but no worsening of pain and perhaps less soreness.  Decreased tender points and myofascial restriction in calf muscles noted following manual therapy.  Fewer cues needed for patellofemoral alignment.    Rehab Potential  Good    PT Frequency  2x / week    PT Duration  8 weeks    PT Treatment/Interventions  ADLs/Self Care Home Management;Aquatic Therapy;Cryotherapy;Electrical  Stimulation;Neuromuscular re-education;Therapeutic exercise;Therapeutic activities;Stair training;Gait training;Patient/family education;Manual techniques;Dry needling;Taping;Vasopneumatic Device    PT Next Visit Plan  quad strengthening progression  manual to posterior tib as needed;  vaso for edema control    PT Home Exercise Plan  Access Code: [email or web address]       Patient will benefit from skilled therapeutic intervention in order to improve the following deficits and impairments:  Abnormal gait, Decreased range of motion, Difficulty walking, Pain, Decreased activity tolerance, Impaired perceived functional ability, Increased edema, Decreased strength  Visit Diagnosis: Muscle weakness (generalized)  Acute pain of left knee  Stiffness of left knee, not elsewhere classified  Localized edema     Problem List Patient Active Problem List   Diagnosis Date Noted  . Fracture of tibial shaft, left, closed 07/05/2019  . Acute lateral meniscus tear of left knee 07/05/2019  . Tibial plateau fracture, left 07/05/2019  . Closed bicondylar fracture of left tibial plateau 07/04/2019  . Encounter for antineoplastic chemotherapy 10/27/2017  . 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 Goals of care, counseling/discussion 10/11/2017  . Encounter for smoking cessation counseling 10/11/2017  . S/P lobectomy of lung 09/30/2017   Ruben Im, PT 10/28/19 2:06 PM Phone: [phone] Fax: [phone] Alvera Singh 10/28/2019, 2:06 PM  [facility] [address], [place], [place], [address] Phone: [phone]   Fax:  [phone]  Name: Brandi Crosby MRN: [id number] Date of Birth: [birth year]

## 2019-11-02 ENCOUNTER — Ambulatory Visit: Payer: 59 | Admitting: Physical Therapy

## 2019-11-02 ENCOUNTER — Encounter: Payer: Self-pay | Admitting: Physical Therapy

## 2019-11-02 ENCOUNTER — Other Ambulatory Visit: Payer: Self-pay

## 2019-11-02 DIAGNOSIS — M25662 Stiffness of left knee, not elsewhere classified: Secondary | ICD-10-CM

## 2019-11-02 DIAGNOSIS — M6281 Muscle weakness (generalized): Secondary | ICD-10-CM

## 2019-11-02 DIAGNOSIS — R600 Localized edema: Secondary | ICD-10-CM

## 2019-11-02 DIAGNOSIS — M25562 Pain in left knee: Secondary | ICD-10-CM

## 2019-11-02 NOTE — Therapy (Signed)
[facility] [address], [place], [place], [address] Phone: [phone]   Fax:  [phone]  Physical Therapy Treatment  Patient Details  Name: Brandi Crosby MRN: [id number] Date of Birth: 1955-12-09 Referring Provider (PT): Dr. Doreatha Martin    Encounter Date: 11/02/2019  PT End of Session - 11/02/19 1241    Visit Number  19    Number of Visits  60    Date for PT Re-Evaluation  12/14/19    Authorization Type  UHC    PT Start Time  1101    PT Stop Time  1150    PT Time Calculation (min)  49 min    Activity Tolerance  Patient tolerated treatment well       Past Medical History:  Diagnosis Date  . Allergy   . Anxiety   . Depression   . Hyperlipemia   . Lung mass   . SCL ca dx'd 08/2017  . Shingles     Past Surgical History:  Procedure Laterality Date  . DENTAL SURGERY  2010   implant  . NODE DISSECTION Left 09/30/2017   Procedure: NODE DISSECTION;  Surgeon: Grace Isaac, MD;  Location: [place];  Service: Thoracic;  Laterality: Left;  . ORIF TIBIA PLATEAU Left 07/05/2019   Procedure: OPEN REDUCTION INTERNAL FIXATION (ORIF) TIBIAL PLATEAU;  Surgeon: Shona Needles, MD;  Location: [place];  Service: Orthopedics;  Laterality: Left;  . RIGHT OOPHORECTOMY    . TONSILLECTOMY AND ADENOIDECTOMY    . VIDEO ASSISTED THORACOSCOPY (VATS)/WEDGE RESECTION Left 09/30/2017   Procedure: VIDEO ASSISTED THORACOSCOPY (VATS)/LUNG RESECTION;  Surgeon: Grace Isaac, MD;  Location: [place];  Service: Thoracic;  Laterality: Left;  [name] VIDEO BRONCHOSCOPY N/A 09/30/2017   Procedure: VIDEO BRONCHOSCOPY;  Surgeon: Grace Isaac, MD;  Location: [facility] OR;  Service: Thoracic;  Laterality: N/A;  . WISDOM TOOTH EXTRACTION      There were no vitals filed for this visit.  Subjective Assessment - 11/02/19 1108    Subjective  It takes me a while to loosen up with walking.    Pertinent History  healthy;  goes by "Brandi Crosby"    Currently in  Pain?  No/denies    Pain Score  0-No pain    Pain Location  Knee    Pain Radiating Towards  knees are sore                       [facility] Adult PT Treatment/Exercise - 11/02/19 0001      Therapeutic Activites    ADL's  carry 5# weight; reciprocal stairs;faster pace walk       Knee/Hip Exercises: Stretches   Active Hamstring Stretch  Left;5 reps    Active Hamstring Stretch Limitations  on 2nd step     Other Knee/Hip Stretches  2nd step ROM 20x       Knee/Hip Exercises: Aerobic   Recumbent Bike  L3 4 min       Knee/Hip Exercises: Standing   Lateral Step Up Limitations  4 inch step up and over 30 sec     Forward Step Up  Left;Hand Hold: 2;Step Height: 6"    Forward Step Up Limitations  30 sec     Wall Squat Limitations  small range 30 sec     SLS with Vectors  4 ways 30 sec     Walking with Sports Cord  20#  backward only 1 minute   Gait Training  fast walk    Other Standing Knee Exercises  dead lifts 10# 1 minute     Other Standing Knee Exercises  windmills 30 sec       Knee/Hip Exercises: Seated   Sit to Sand  --   holding 10# 30 sec      Vasopneumatic   Number Minutes Vasopneumatic   10 minutes    Vasopnuematic Location   Knee    Vasopneumatic Pressure  Medium    Vasopneumatic Temperature   34 deg               PT Short Term Goals - 10/19/19 1611      PT SHORT TERM GOAL #1   Title  The patient will demonstrate knowlege of basic self care and ex's to promote ROM and healing    Status  Achieved      PT SHORT TERM GOAL #2   Title  Left knee flexion to 100 degrees needed for greater ease in/out of the car and with sit to stand    Status  Achieved      PT SHORT TERM GOAL #3   Title  The patient will have decreased knee edema with patella circumferential measurement decreased to 36 cm    Status  Achieved      PT SHORT TERM GOAL #4   Title  The patient will be able to ambulate > 300 feet for short to medium community distances    Status   Achieved      PT SHORT TERM GOAL #5   Title  The patient will be able to ascend/descend steps one at a time independently    Status  Achieved        PT Long Term Goals - 10/19/19 1612      PT LONG TERM GOAL #1   Title  The patient will be independent with a safe self progession of HEP    Time  8    Period  Weeks    Status  On-going    Target Date  12/14/19      PT LONG TERM GOAL #2   Title  The patient will have left knee ROM 0-132 degrees for greater ease ascending and descending stairs    Time  8    Period  Weeks    Status  Revised      PT LONG TERM GOAL #3   Title  The patient will be able to ambulate >1/4 mile needed for work with pain level 2/10 or less    Time  8    Period  Weeks    Status  Revised      PT LONG TERM GOAL #4   Title  The patient will have 4+/5 left knee strength needed for ascending and descending stairs and walking/standing longer periods of time    Time  8    Period  Weeks    Status  Revised      PT LONG TERM GOAL #5   Title  FOTO functional outcome score improved from 53% limitation to 30% indicating improved function with less pain    Time  8    Period  Weeks    Status  On-going            Plan - 11/02/19 1241    Clinical Impression Statement  The patient is able to increase difficulty of ex  with added resistance/loading and increased consecutive time performed before resting.  She denies pain but has  appropriate level of muscle fatigue given the intensity of the ex.  Improving gait pattern although with added speed, decreased stance time on left.  She is continues to have below normal gait speed.  Therapist monitoring and modifying treatment based on response.  Vasocompression provides excellent pain relief and edema management.    Examination-Participation Restrictions  Community Activity;Driving;Yard Work;Other;Shop    Stability/Clinical Decision Making  Stable/Uncomplicated    Rehab Potential  Good    PT Frequency  2x / week    PT  Duration  8 weeks    PT Treatment/Interventions  ADLs/Self Care Home Management;Aquatic Therapy;Cryotherapy;Electrical Stimulation;Neuromuscular re-education;Therapeutic exercise;Therapeutic activities;Stair training;Gait training;Patient/family education;Manual techniques;Dry needling;Taping;Vasopneumatic Device    PT Next Visit Plan  recheck knee ROM;  left LE strengthening progression, reciprocal stair climbing; increase gait speed; vasocompression    PT Home Exercise Plan  Access Code: [email or web address]       Patient will benefit from skilled therapeutic intervention in order to improve the following deficits and impairments:  Abnormal gait, Decreased range of motion, Difficulty walking, Pain, Decreased activity tolerance, Impaired perceived functional ability, Increased edema, Decreased strength  Visit Diagnosis: Muscle weakness (generalized)  Acute pain of left knee  Stiffness of left knee, not elsewhere classified  Localized edema     Problem List Patient Active Problem List   Diagnosis Date Noted  . Fracture of tibial shaft, left, closed 07/05/2019  . Acute lateral meniscus tear of left knee 07/05/2019  . Tibial plateau fracture, left 07/05/2019  . Closed bicondylar fracture of left tibial plateau 07/04/2019  . Encounter for antineoplastic chemotherapy 10/27/2017  . 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 Goals of care, counseling/discussion 10/11/2017  . Encounter for smoking cessation counseling 10/11/2017  . S/P lobectomy of lung 09/30/2017   Ruben Im, PT 11/02/19 12:52 PM Phone: [phone] Fax: [phone] Alvera Singh 11/02/2019, 12:52 PM  [facility] [address] [place], [place], [address] Phone: [phone]   Fax:  [phone]  Name: Brandi Crosby MRN: [id number] Date of Birth: 10/19/1955

## 2019-11-04 ENCOUNTER — Ambulatory Visit: Payer: 59 | Admitting: Physical Therapy

## 2019-11-04 ENCOUNTER — Other Ambulatory Visit: Payer: Self-pay

## 2019-11-04 DIAGNOSIS — M25562 Pain in left knee: Secondary | ICD-10-CM

## 2019-11-04 DIAGNOSIS — M6281 Muscle weakness (generalized): Secondary | ICD-10-CM | POA: Diagnosis not present

## 2019-11-04 DIAGNOSIS — M25662 Stiffness of left knee, not elsewhere classified: Secondary | ICD-10-CM

## 2019-11-04 DIAGNOSIS — R600 Localized edema: Secondary | ICD-10-CM

## 2019-11-04 NOTE — Therapy (Signed)
[facility] [address], [place], [place], [address] Phone: [phone]   Fax:  [phone]  Physical Therapy Treatment  Patient Details  Name: Brandi Crosby MRN: [id number] Date of Birth: 05-17-1956 Referring Provider (PT): Dr. Doreatha Martin    Encounter Date: 11/04/2019  PT End of Session - 11/04/19 1137    Visit Number  20    Number of Visits  60    Date for PT Re-Evaluation  12/14/19    Authorization Type  UHC    PT Start Time  1100    PT Stop Time  1148    PT Time Calculation (min)  48 min    Activity Tolerance  Patient tolerated treatment well       Past Medical History:  Diagnosis Date  . Allergy   . Anxiety   . Depression   . Hyperlipemia   . Lung mass   . SCL ca dx'd 08/2017  . Shingles     Past Surgical History:  Procedure Laterality Date  . DENTAL SURGERY  2010   implant  . NODE DISSECTION Left 09/30/2017   Procedure: NODE DISSECTION;  Surgeon: Grace Isaac, MD;  Location: [place];  Service: Thoracic;  Laterality: Left;  . ORIF TIBIA PLATEAU Left 07/05/2019   Procedure: OPEN REDUCTION INTERNAL FIXATION (ORIF) TIBIAL PLATEAU;  Surgeon: Shona Needles, MD;  Location: [place];  Service: Orthopedics;  Laterality: Left;  . RIGHT OOPHORECTOMY    . TONSILLECTOMY AND ADENOIDECTOMY    . VIDEO ASSISTED THORACOSCOPY (VATS)/WEDGE RESECTION Left 09/30/2017   Procedure: VIDEO ASSISTED THORACOSCOPY (VATS)/LUNG RESECTION;  Surgeon: Grace Isaac, MD;  Location: [place];  Service: Thoracic;  Laterality: Left;  [name] VIDEO BRONCHOSCOPY N/A 09/30/2017   Procedure: VIDEO BRONCHOSCOPY;  Surgeon: Grace Isaac, MD;  Location: [facility] OR;  Service: Thoracic;  Laterality: N/A;  . WISDOM TOOTH EXTRACTION      There were no vitals filed for this visit.  Subjective Assessment - 11/04/19 1103    Subjective  I was sore after last time in a good way.  Took her dog for a good walk yesterday.    Pertinent History  healthy;   goes by "Brandi Crosby"    Currently in Pain?  No/denies    Pain Score  0-No pain    Pain Radiating Towards  my legs are sore                        [facility] Adult PT Treatment/Exercise - 11/04/19 0001      Therapeutic Activites    ADL's  up and down steps reciprocally 30 sec      Knee/Hip Exercises: Stretches   Active Hamstring Stretch  Left;5 reps    Active Hamstring Stretch Limitations  on 2nd step     Other Knee/Hip Stretches  2nd step ROM 20x       Knee/Hip Exercises: Aerobic   Recumbent Bike  L3 5 min       Knee/Hip Exercises: Standing   Heel Raises  Both;1 set    Heel Raises Limitations  30 sec     Step Down Limitations  posterior step down 30 sec     Wall Squat Limitations  kickstand position with large red ball bounce     Lunge Walking - Round Trips  floor slider holding 4# weight at shoulder 30 sec right/left     Rebounder  3 ways 1 minute each  Gait Training  fast walk and carrying bil 4# weights     Other Standing Knee Exercises  dead lifts 10# 30 sec     Other Standing Knee Exercises  windmills 30 sec       Vasopneumatic   Number Minutes Vasopneumatic   15 minutes    Vasopnuematic Location   Knee    Vasopneumatic Pressure  Medium    Vasopneumatic Temperature   34 deg               PT Short Term Goals - 10/19/19 1611      PT SHORT TERM GOAL #1   Title  The patient will demonstrate knowlege of basic self care and ex's to promote ROM and healing    Status  Achieved      PT SHORT TERM GOAL #2   Title  Left knee flexion to 100 degrees needed for greater ease in/out of the car and with sit to stand    Status  Achieved      PT SHORT TERM GOAL #3   Title  The patient will have decreased knee edema with patella circumferential measurement decreased to 36 cm    Status  Achieved      PT SHORT TERM GOAL #4   Title  The patient will be able to ambulate > 300 feet for short to medium community distances    Status  Achieved      PT SHORT TERM  GOAL #5   Title  The patient will be able to ascend/descend steps one at a time independently    Status  Achieved        PT Long Term Goals - 10/19/19 1612      PT LONG TERM GOAL #1   Title  The patient will be independent with a safe self progession of HEP    Time  8    Period  Weeks    Status  On-going    Target Date  12/14/19      PT LONG TERM GOAL #2   Title  The patient will have left knee ROM 0-132 degrees for greater ease ascending and descending stairs    Time  8    Period  Weeks    Status  Revised      PT LONG TERM GOAL #3   Title  The patient will be able to ambulate >1/4 mile needed for work with pain level 2/10 or less    Time  8    Period  Weeks    Status  Revised      PT LONG TERM GOAL #4   Title  The patient will have 4+/5 left knee strength needed for ascending and descending stairs and walking/standing longer periods of time    Time  8    Period  Weeks    Status  Revised      PT LONG TERM GOAL #5   Title  FOTO functional outcome score improved from 53% limitation to 30% indicating improved function with less pain    Time  8    Period  Weeks    Status  On-going            Plan - 11/04/19 1139    Clinical Impression Statement  Improved gait speed noted today.  Increased single leg weight bearing exercises with verbal cues to avoid compensatory knee hyperextensions.  She has visible quad muscle quivering in response to fatigue.  She is hightly motivated and receptive  to a continued progression of ex intensity.  Improved fluidity of motion with reciprocal stairs.    Rehab Potential  Good    PT Frequency  2x / week    PT Duration  8 weeks    PT Treatment/Interventions  ADLs/Self Care Home Management;Aquatic Therapy;Cryotherapy;Electrical Stimulation;Neuromuscular re-education;Therapeutic exercise;Therapeutic activities;Stair training;Gait training;Patient/family education;Manual techniques;Dry needling;Taping;Vasopneumatic Device    PT Next Visit Plan   recheck knee ROM;  left LE strengthening progression, reciprocal stair climbing; increase gait speed; vasocompression    PT Home Exercise Plan  Access Code: [email or web address]       Patient will benefit from skilled therapeutic intervention in order to improve the following deficits and impairments:  Abnormal gait, Decreased range of motion, Difficulty walking, Pain, Decreased activity tolerance, Impaired perceived functional ability, Increased edema, Decreased strength  Visit Diagnosis: Muscle weakness (generalized)  Acute pain of left knee  Stiffness of left knee, not elsewhere classified  Localized edema     Problem List Patient Active Problem List   Diagnosis Date Noted  . Fracture of tibial shaft, left, closed 07/05/2019  . Acute lateral meniscus tear of left knee 07/05/2019  . Tibial plateau fracture, left 07/05/2019  . Closed bicondylar fracture of left tibial plateau 07/04/2019  . Encounter for antineoplastic chemotherapy 10/27/2017  . 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 Goals of care, counseling/discussion 10/11/2017  . Encounter for smoking cessation counseling 10/11/2017  . S/P lobectomy of lung 09/30/2017   Ruben Im, PT 11/04/19 11:43 AM Phone: [phone] Fax: [phone] Alvera Singh 11/04/2019, 11:42 AM  [facility] [address] [place], [place], [address] Phone: [phone]   Fax:  [phone]  Name: Brandi Crosby MRN: [id number] Date of Birth: October 20, 1955

## 2019-11-09 ENCOUNTER — Other Ambulatory Visit: Payer: Self-pay

## 2019-11-09 ENCOUNTER — Ambulatory Visit: Payer: 59 | Admitting: Physical Therapy

## 2019-11-09 ENCOUNTER — Encounter: Payer: Self-pay | Admitting: Physical Therapy

## 2019-11-09 DIAGNOSIS — M6281 Muscle weakness (generalized): Secondary | ICD-10-CM | POA: Diagnosis not present

## 2019-11-09 DIAGNOSIS — R600 Localized edema: Secondary | ICD-10-CM

## 2019-11-09 DIAGNOSIS — M25662 Stiffness of left knee, not elsewhere classified: Secondary | ICD-10-CM

## 2019-11-09 DIAGNOSIS — M25562 Pain in left knee: Secondary | ICD-10-CM

## 2019-11-09 NOTE — Therapy (Signed)
[facility] [address] [place], [place], [address] Phone: [phone]   Fax:  [phone]  Physical Therapy Treatment  Patient Details  Name: Brandi Crosby MRN: [id number] Date of Birth: Dec 29, 1955 Referring Provider (PT): Dr. Doreatha Martin    Encounter Date: 11/09/2019  PT End of Session - 11/09/19 1144    Visit Number  21    Number of Visits  60    Date for PT Re-Evaluation  12/14/19    Authorization Type  UHC    PT Start Time  7412    PT Stop Time  1225    PT Time Calculation (min)  43 min    Activity Tolerance  Patient tolerated treatment well       Past Medical History:  Diagnosis Date  . Allergy   . Anxiety   . Depression   . Hyperlipemia   . Lung mass   . SCL ca dx'd 08/2017  . Shingles     Past Surgical History:  Procedure Laterality Date  . DENTAL SURGERY  2010   implant  . NODE DISSECTION Left 09/30/2017   Procedure: NODE DISSECTION;  Surgeon: Grace Isaac, MD;  Location: [place];  Service: Thoracic;  Laterality: Left;  . ORIF TIBIA PLATEAU Left 07/05/2019   Procedure: OPEN REDUCTION INTERNAL FIXATION (ORIF) TIBIAL PLATEAU;  Surgeon: Shona Needles, MD;  Location: [place];  Service: Orthopedics;  Laterality: Left;  . RIGHT OOPHORECTOMY    . TONSILLECTOMY AND ADENOIDECTOMY    . VIDEO ASSISTED THORACOSCOPY (VATS)/WEDGE RESECTION Left 09/30/2017   Procedure: VIDEO ASSISTED THORACOSCOPY (VATS)/LUNG RESECTION;  Surgeon: Grace Isaac, MD;  Location: [place];  Service: Thoracic;  Laterality: Left;  [name] VIDEO BRONCHOSCOPY N/A 09/30/2017   Procedure: VIDEO BRONCHOSCOPY;  Surgeon: Grace Isaac, MD;  Location[facility];  Service: Thoracic;  Laterality: N/A;  . WISDOM TOOTH EXTRACTION      There were no vitals filed for this visit.  Subjective Assessment - 11/09/19 1104    Subjective  Sore from increased activity at home.  After last visit just sore from the workout.    Pertinent History   healthy;  goes by "Brandi Crosby"    Currently in Pain?  Yes    Pain Location  Knee    Pain Orientation  Left         [facility] PT Assessment - 11/09/19 0001      AROM   Left Knee Extension  0    Left Knee Flexion  138                    [facility] Adult PT Treatment/Exercise - 11/09/19 0001      Therapeutic Activites    ADL's  up and down steps reciprocally 30 sec      Knee/Hip Exercises: Stretches   Active Hamstring Stretch  Left;5 reps    Active Hamstring Stretch Limitations  on 2nd step     Other Knee/Hip Stretches  2nd step ROM 20x       Knee/Hip Exercises: Aerobic   Recumbent Bike  L3 5 min       Knee/Hip Exercises: Standing   Heel Raises Limitations  staggered stand heel raise 10x    Lateral Step Up Limitations  2 inch lateral step down 3 point heel touches 30 sec     Forward Step Up Limitations  2 inch step up and over 30 sec x2     SLS  hip abduction  isometric 30 sec     Rebounder  staggered stand 2# plyo ball toss against the rebounder 1 minute x2     Walking with Sports Cord  carry bil 6# hand wts 30 sec     Gait Training  fast walk and carrying bil 4# weights     Other Standing Knee Exercises  dead lifts 25# 30 sec     Other Standing Knee Exercises  lateral stepping on BOSU 2 sets of 30 sec       Vasopneumatic   Number Minutes Vasopneumatic   10 minutes    Vasopnuematic Location   Knee    Vasopneumatic Pressure  Medium    Vasopneumatic Temperature   34 deg               PT Short Term Goals - 10/19/19 1611      PT SHORT TERM GOAL #1   Title  The patient will demonstrate knowlege of basic self care and ex's to promote ROM and healing    Status  Achieved      PT SHORT TERM GOAL #2   Title  Left knee flexion to 100 degrees needed for greater ease in/out of the car and with sit to stand    Status  Achieved      PT SHORT TERM GOAL #3   Title  The patient will have decreased knee edema with patella circumferential measurement decreased to 36 cm     Status  Achieved      PT SHORT TERM GOAL #4   Title  The patient will be able to ambulate > 300 feet for short to medium community distances    Status  Achieved      PT SHORT TERM GOAL #5   Title  The patient will be able to ascend/descend steps one at a time independently    Status  Achieved        PT Long Term Goals - 10/19/19 1612      PT LONG TERM GOAL #1   Title  The patient will be independent with a safe self progession of HEP    Time  8    Period  Weeks    Status  On-going    Target Date  12/14/19      PT LONG TERM GOAL #2   Title  The patient will have left knee ROM 0-132 degrees for greater ease ascending and descending stairs    Time  8    Period  Weeks    Status  Revised      PT LONG TERM GOAL #3   Title  The patient will be able to ambulate >1/4 mile needed for work with pain level 2/10 or less    Time  8    Period  Weeks    Status  Revised      PT LONG TERM GOAL #4   Title  The patient will have 4+/5 left knee strength needed for ascending and descending stairs and walking/standing longer periods of time    Time  8    Period  Weeks    Status  Revised      PT LONG TERM GOAL #5   Title  FOTO functional outcome score improved from 53% limitation to 30% indicating improved function with less pain    Time  8    Period  Weeks    Status  On-going            Plan -  11/09/19 1144    Clinical Impression Statement  The patient reports a good challenge with a progression of interval style loaded strength training and proprioceptive exercises.  Her left knee ROM has significantly improved to 0-138 degrees with decreasing peripatellar edema.  Improving gait speed and reciprocal stair climbing noted.  Therapist closely monitoring response with all interventions.    Rehab Potential  Good    PT Frequency  2x / week    PT Duration  8 weeks    PT Treatment/Interventions  ADLs/Self Care Home Management;Aquatic Therapy;Cryotherapy;Electrical  Stimulation;Neuromuscular re-education;Therapeutic exercise;Therapeutic activities;Stair training;Gait training;Patient/family education;Manual techniques;Dry needling;Taping;Vasopneumatic Device    PT Next Visit Plan  try foot prop on BOSU with rebounder;  left LE strengthening progression, reciprocal stair climbing; increase gait speed; vasocompression    PT Home Exercise Plan  Access Code: [email or web address]       Patient will benefit from skilled therapeutic intervention in order to improve the following deficits and impairments:  Abnormal gait, Decreased range of motion, Difficulty walking, Pain, Decreased activity tolerance, Impaired perceived functional ability, Increased edema, Decreased strength  Visit Diagnosis: Muscle weakness (generalized)  Acute pain of left knee  Stiffness of left knee, not elsewhere classified  Localized edema     Problem List Patient Active Problem List   Diagnosis Date Noted  . Fracture of tibial shaft, left, closed 07/05/2019  . Acute lateral meniscus tear of left knee 07/05/2019  . Tibial plateau fracture, left 07/05/2019  . Closed bicondylar fracture of left tibial plateau 07/04/2019  . Encounter for antineoplastic chemotherapy 10/27/2017  . 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 Goals of care, counseling/discussion 10/11/2017  . Encounter for smoking cessation counseling 10/11/2017  . S/P lobectomy of lung 09/30/2017   Ruben Im, PT 11/09/19 12:43 PM Phone: [phone] Fax: [phone] Alvera Singh 11/09/2019, 12:43 PM  [facility] [address], [place], [place], [address] Phone: [phone]   Fax:  [phone]  Name: Brandi Crosby MRN: [id number] Date of Birth: Aug 22, 1955

## 2019-11-11 ENCOUNTER — Ambulatory Visit: Payer: 59 | Admitting: Physical Therapy

## 2019-11-11 ENCOUNTER — Other Ambulatory Visit: Payer: Self-pay

## 2019-11-11 DIAGNOSIS — M6281 Muscle weakness (generalized): Secondary | ICD-10-CM

## 2019-11-11 DIAGNOSIS — R600 Localized edema: Secondary | ICD-10-CM

## 2019-11-11 DIAGNOSIS — M25662 Stiffness of left knee, not elsewhere classified: Secondary | ICD-10-CM

## 2019-11-11 DIAGNOSIS — M25562 Pain in left knee: Secondary | ICD-10-CM

## 2019-11-11 NOTE — Therapy (Signed)
[facility] [address], [place], [place], [address] Phone: [phone]   Fax:  [phone]  Physical Therapy Treatment  Patient Details  Name: Brandi Crosby MRN: [id number] Date of Birth: 05/16/1956 Referring Provider (PT): Dr. Doreatha Martin    Encounter Date: 11/11/2019  PT End of Session - 11/11/19 1142    Visit Number  22    Number of Visits  60    Date for PT Re-Evaluation  12/14/19    Authorization Type  UHC    PT Start Time  1100    PT Stop Time  1150    PT Time Calculation (min)  50 min    Activity Tolerance  Patient tolerated treatment well       Past Medical History:  Diagnosis Date  . Allergy   . Anxiety   . Depression   . Hyperlipemia   . Lung mass   . SCL ca dx'd 08/2017  . Shingles     Past Surgical History:  Procedure Laterality Date  . DENTAL SURGERY  2010   implant  . NODE DISSECTION Left 09/30/2017   Procedure: NODE DISSECTION;  Surgeon: Grace Isaac, MD;  Location: [place];  Service: Thoracic;  Laterality: Left;  . ORIF TIBIA PLATEAU Left 07/05/2019   Procedure: OPEN REDUCTION INTERNAL FIXATION (ORIF) TIBIAL PLATEAU;  Surgeon: Shona Needles, MD;  Location: [place];  Service: Orthopedics;  Laterality: Left;  . RIGHT OOPHORECTOMY    . TONSILLECTOMY AND ADENOIDECTOMY    . VIDEO ASSISTED THORACOSCOPY (VATS)/WEDGE RESECTION Left 09/30/2017   Procedure: VIDEO ASSISTED THORACOSCOPY (VATS)/LUNG RESECTION;  Surgeon: Grace Isaac, MD;  Location: [place];  Service: Thoracic;  Laterality: Left;  [name] VIDEO BRONCHOSCOPY N/A 09/30/2017   Procedure: VIDEO BRONCHOSCOPY;  Surgeon: Grace Isaac, MD;  Location: [facility];  Service: Thoracic;  Laterality: N/A;  . WISDOM TOOTH EXTRACTION      There were no vitals filed for this visit.  Subjective Assessment - 11/11/19 1109    Subjective  Reports soreness following last visit but not too bad.    Pertinent History  healthy;  goes by "[name]"    Currently in Pain?  No/denies    Pain Score  0-No pain    Pain Location  Knee    Pain Type  Acute pain                        [facility] Adult PT Treatment/Exercise - 11/11/19 0001      Knee/Hip Exercises: Stretches   Active Hamstring Stretch  Left;5 reps    Active Hamstring Stretch Limitations  on 2nd step     Other Knee/Hip Stretches  2nd step ROM 20x       Knee/Hip Exercises: Aerobic   Recumbent Bike  L3 5 min       Knee/Hip Exercises: Machines for Strengthening   Total Gym Leg Press  seat 7 70# bil 15x; left only 35#       Knee/Hip Exercises: Standing   Heel Raises Limitations  holding 2 4# weights 30 sec    Lunge Walking - Round Trips  holding 2 4# weight forward and back mini lunges 10x     SLS with Vectors  2# plyo ball toss against rebounder 1 minute    Walking with Sports Cord  25# backward and forward 5x each;  15# side step 3x each way     Other Standing Knee Exercises  4  inch and 6 inch step up and overs 10x each     Other Standing Knee Exercises  red band on right with 10x 4 directions, WB on left       Vasopneumatic   Number Minutes Vasopneumatic   10 minutes    Vasopnuematic Location   Knee    Vasopneumatic Pressure  Medium    Vasopneumatic Temperature   34 deg               PT Short Term Goals - 10/19/19 1611      PT SHORT TERM GOAL #1   Title  The patient will demonstrate knowlege of basic self care and ex's to promote ROM and healing    Status  Achieved      PT SHORT TERM GOAL #2   Title  Left knee flexion to 100 degrees needed for greater ease in/out of the car and with sit to stand    Status  Achieved      PT SHORT TERM GOAL #3   Title  The patient will have decreased knee edema with patella circumferential measurement decreased to 36 cm    Status  Achieved      PT SHORT TERM GOAL #4   Title  The patient will be able to ambulate > 300 feet for short to medium community distances    Status  Achieved      PT SHORT TERM GOAL #5    Title  The patient will be able to ascend/descend steps one at a time independently    Status  Achieved        PT Long Term Goals - 10/19/19 1612      PT LONG TERM GOAL #1   Title  The patient will be independent with a safe self progession of HEP    Time  8    Period  Weeks    Status  On-going    Target Date  12/14/19      PT LONG TERM GOAL #2   Title  The patient will have left knee ROM 0-132 degrees for greater ease ascending and descending stairs    Time  8    Period  Weeks    Status  Revised      PT LONG TERM GOAL #3   Title  The patient will be able to ambulate >1/4 mile needed for work with pain level 2/10 or less    Time  8    Period  Weeks    Status  Revised      PT LONG TERM GOAL #4   Title  The patient will have 4+/5 left knee strength needed for ascending and descending stairs and walking/standing longer periods of time    Time  8    Period  Weeks    Status  Revised      PT LONG TERM GOAL #5   Title  FOTO functional outcome score improved from 53% limitation to 30% indicating improved function with less pain    Time  8    Period  Weeks    Status  On-going            Plan - 11/11/19 1142    Clinical Impression Statement  The patient is to perform a progression of LE strengthening and proprioceptive ex's.  She denies pain but has an expected level of muscular fatigue.  Mild swelling peripatellar and anterior lower leg.   Improved fluidity of motion with curb stepping up and  overs.  Therapist monitoring response with all interventions.    Examination-Activity Limitations  Locomotion Level;Bathing;Carry;Stairs;Lift;Stand    Rehab Potential  Good    PT Frequency  2x / week    PT Duration  8 weeks    PT Treatment/Interventions  ADLs/Self Care Home Management;Aquatic Therapy;Cryotherapy;Electrical Stimulation;Neuromuscular re-education;Therapeutic exercise;Therapeutic activities;Stair training;Gait training;Patient/family education;Manual techniques;Dry  needling;Taping;Vasopneumatic Device    PT Next Visit Plan  foot prop on BOSU with rebounder;  left LE strengthening progression, reciprocal stair climbing; increase gait speed; vasocompression    PT Home Exercise Plan  Access Code: [email or web address]       Patient will benefit from skilled therapeutic intervention in order to improve the following deficits and impairments:  Abnormal gait, Decreased range of motion, Difficulty walking, Pain, Decreased activity tolerance, Impaired perceived functional ability, Increased edema, Decreased strength  Visit Diagnosis: Muscle weakness (generalized)  Acute pain of left knee  Stiffness of left knee, not elsewhere classified  Localized edema     Problem List Patient Active Problem List   Diagnosis Date Noted  . Fracture of tibial shaft, left, closed 07/05/2019  . Acute lateral meniscus tear of left knee 07/05/2019  . Tibial plateau fracture, left 07/05/2019  . Closed bicondylar fracture of left tibial plateau 07/04/2019  . Encounter for antineoplastic chemotherapy 10/27/2017  . 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 Goals of care, counseling/discussion 10/11/2017  . Encounter for smoking cessation counseling 10/11/2017  . S/P lobectomy of lung 09/30/2017   Ruben Im, PT 11/11/19 1:25 PM Phone: [phone] Fax: [phone] Alvera Singh 11/11/2019, 1:25 PM  [facility] [address] [place], [place], [address] Phone: [phone]   Fax:  [phone]  Name: Fred Hammes MRN: [id number] Date of Birth: [birth year]

## 2019-11-15 ENCOUNTER — Ambulatory Visit
Admission: RE | Admit: 2019-11-15 | Discharge: 2019-11-15 | Disposition: A | Discharge status: Home / Self Care | Payer: 59 | Source: Ambulatory Visit | Attending: Family Medicine | Admitting: Family Medicine

## 2019-11-15 ENCOUNTER — Other Ambulatory Visit: Payer: Self-pay

## 2019-11-15 DIAGNOSIS — Z1231 Encounter for screening mammogram for malignant neoplasm of breast: Secondary | ICD-10-CM

## 2019-11-16 ENCOUNTER — Ambulatory Visit: Payer: 59 | Admitting: Physical Therapy

## 2019-11-16 ENCOUNTER — Other Ambulatory Visit: Payer: Self-pay

## 2019-11-16 DIAGNOSIS — M25562 Pain in left knee: Secondary | ICD-10-CM

## 2019-11-16 DIAGNOSIS — R600 Localized edema: Secondary | ICD-10-CM

## 2019-11-16 DIAGNOSIS — M6281 Muscle weakness (generalized): Secondary | ICD-10-CM

## 2019-11-16 DIAGNOSIS — M25662 Stiffness of left knee, not elsewhere classified: Secondary | ICD-10-CM

## 2019-11-16 NOTE — Therapy (Signed)
[facility] [address], [place], [place], [address] Phone: [phone]   Fax:  [phone]  Physical Therapy Treatment  Patient Details  Name: Brandi Crosby MRN: [id number] Date of Birth: 1956-03-16 Referring Provider (PT): Dr. Doreatha Martin    Encounter Date: 11/16/2019  PT End of Session - 11/16/19 1531    Visit Number  23    Number of Visits  60    Date for PT Re-Evaluation  12/14/19    Authorization Type  UHC    PT Start Time  1100    PT Stop Time  1150    PT Time Calculation (min)  50 min    Activity Tolerance  Patient tolerated treatment well       Past Medical History:  Diagnosis Date  . Allergy   . Anxiety   . Depression   . Hyperlipemia   . Lung mass   . SCL ca dx'd 08/2017  . Shingles     Past Surgical History:  Procedure Laterality Date  . DENTAL SURGERY  2010   implant  . NODE DISSECTION Left 09/30/2017   Procedure: NODE DISSECTION;  Surgeon: Grace Isaac, MD;  Location: [place];  Service: Thoracic;  Laterality: Left;  . ORIF TIBIA PLATEAU Left 07/05/2019   Procedure: OPEN REDUCTION INTERNAL FIXATION (ORIF) TIBIAL PLATEAU;  Surgeon: Shona Needles, MD;  Location: [place];  Service: Orthopedics;  Laterality: Left;  . RIGHT OOPHORECTOMY    . TONSILLECTOMY AND ADENOIDECTOMY    . VIDEO ASSISTED THORACOSCOPY (VATS)/WEDGE RESECTION Left 09/30/2017   Procedure: VIDEO ASSISTED THORACOSCOPY (VATS)/LUNG RESECTION;  Surgeon: Grace Isaac, MD;  Location: [place];  Service: Thoracic;  Laterality: Left;  [name] VIDEO BRONCHOSCOPY N/A 09/30/2017   Procedure: VIDEO BRONCHOSCOPY;  Surgeon: Grace Isaac, MD;  Location: [facility] OR;  Service: Thoracic;  Laterality: N/A;  . WISDOM TOOTH EXTRACTION      There were no vitals filed for this visit.  Subjective Assessment - 11/16/19 1106    Subjective  Less soreness at the end of the day.  I'm encouraged.    Currently in Pain?  No/denies    Pain Score  0-No pain     Pain Location  Knee    Pain Orientation  Left    Pain Type  Acute pain                        [facility] Adult PT Treatment/Exercise - 11/16/19 0001      Knee/Hip Exercises: Stretches   Other Knee/Hip Stretches  2nd step ROM 20x       Knee/Hip Exercises: Aerobic   Recumbent Bike  L3 5 min       Knee/Hip Exercises: Machines for Strengthening   Total Gym Leg Press  seat 7 35# 2x 10       Knee/Hip Exercises: Standing   Heel Raises Limitations  box lift and lowers 5x each way     Forward Step Up  Left;10 reps;Hand Hold: 0;Step Height: 4"    Forward Step Up Limitations  mirror feedback     Step Down  Left;1 set;5 reps;Hand Hold: 1;Step Height: 4"    Wall Squat Limitations  dead lift 10#     SLS with Vectors  forward and back right touches WB on left     Rebounder  foot prop on BOSU with plyo ball toss 2 min     Gait Training  high stepping, side  stepping 2 laps each     Other Standing Knee Exercises  alternating mini lunge BOSU to black foam 1 minute     Other Standing Knee Exercises  10# suitcase carry 5# in each hand 1 minute       Vasopneumatic   Number Minutes Vasopneumatic   10 minutes    Vasopnuematic Location   Knee    Vasopneumatic Pressure  Medium    Vasopneumatic Temperature   34 deg               PT Short Term Goals - 10/19/19 1611      PT SHORT TERM GOAL #1   Title  The patient will demonstrate knowlege of basic self care and ex's to promote ROM and healing    Status  Achieved      PT SHORT TERM GOAL #2   Title  Left knee flexion to 100 degrees needed for greater ease in/out of the car and with sit to stand    Status  Achieved      PT SHORT TERM GOAL #3   Title  The patient will have decreased knee edema with patella circumferential measurement decreased to 36 cm    Status  Achieved      PT SHORT TERM GOAL #4   Title  The patient will be able to ambulate > 300 feet for short to medium community distances    Status  Achieved      PT  SHORT TERM GOAL #5   Title  The patient will be able to ascend/descend steps one at a time independently    Status  Achieved        PT Long Term Goals - 10/19/19 1612      PT LONG TERM GOAL #1   Title  The patient will be independent with a safe self progession of HEP    Time  8    Period  Weeks    Status  On-going    Target Date  12/14/19      PT LONG TERM GOAL #2   Title  The patient will have left knee ROM 0-132 degrees for greater ease ascending and descending stairs    Time  8    Period  Weeks    Status  Revised      PT LONG TERM GOAL #3   Title  The patient will be able to ambulate >1/4 mile needed for work with pain level 2/10 or less    Time  8    Period  Weeks    Status  Revised      PT LONG TERM GOAL #4   Title  The patient will have 4+/5 left knee strength needed for ascending and descending stairs and walking/standing longer periods of time    Time  8    Period  Weeks    Status  Revised      PT LONG TERM GOAL #5   Title  FOTO functional outcome score improved from 53% limitation to 30% indicating improved function with less pain    Time  8    Period  Weeks    Status  On-going            Plan - 11/16/19 1532    Clinical Impression Statement  The patient is able to continue with a progression of LE strengthening with more single limb focused challenges.  She does fatigue rather quickly and needs light UE support at times for balance intermittently.  Much improved gait speed.  She is able to ascend steps reciprocally without UE support but needs light UE assist to descend.  Mild LE edema persists but improved following vasocompression device.  On track to meet rehab goals.    Rehab Potential  Good    PT Frequency  2x / week    PT Duration  8 weeks    PT Treatment/Interventions  ADLs/Self Care Home Management;Aquatic Therapy;Cryotherapy;Electrical Stimulation;Neuromuscular re-education;Therapeutic exercise;Therapeutic activities;Stair training;Gait  training;Patient/family education;Manual techniques;Dry needling;Taping;Vasopneumatic Device    PT Next Visit Plan  foot prop on BOSU with rebounder;  left LE strengthening progression, reciprocal stair climbing; increase gait speed; vasocompression    PT Home Exercise Plan  Access Code: [email or web address]       Patient will benefit from skilled therapeutic intervention in order to improve the following deficits and impairments:  Abnormal gait, Decreased range of motion, Difficulty walking, Pain, Decreased activity tolerance, Impaired perceived functional ability, Increased edema, Decreased strength  Visit Diagnosis: Muscle weakness (generalized)  Acute pain of left knee  Stiffness of left knee, not elsewhere classified  Localized edema     Problem List Patient Active Problem List   Diagnosis Date Noted  . Fracture of tibial shaft, left, closed 07/05/2019  . Acute lateral meniscus tear of left knee 07/05/2019  . Tibial plateau fracture, left 07/05/2019  . Closed bicondylar fracture of left tibial plateau 07/04/2019  . Encounter for antineoplastic chemotherapy 10/27/2017  . 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 Goals of care, counseling/discussion 10/11/2017  . Encounter for smoking cessation counseling 10/11/2017  . S/P lobectomy of lung 09/30/2017   Ruben Im, PT 11/16/19 5:20 PM Phone: [phone] Fax: [phone] Alvera Singh 11/16/2019, 5:20 PM  [facility] [address] [place], [place], [address] Phone: [phone]   Fax:  [phone]  Name: Adelaine Roppolo MRN: [id number] Date of Birth: 06-Feb-1956

## 2019-11-18 ENCOUNTER — Other Ambulatory Visit: Payer: Self-pay

## 2019-11-18 ENCOUNTER — Ambulatory Visit: Payer: 59 | Admitting: Physical Therapy

## 2019-11-18 DIAGNOSIS — M6281 Muscle weakness (generalized): Secondary | ICD-10-CM

## 2019-11-18 DIAGNOSIS — R600 Localized edema: Secondary | ICD-10-CM

## 2019-11-18 DIAGNOSIS — M25562 Pain in left knee: Secondary | ICD-10-CM

## 2019-11-18 DIAGNOSIS — M25662 Stiffness of left knee, not elsewhere classified: Secondary | ICD-10-CM

## 2019-11-18 NOTE — Therapy (Signed)
[facility] [address], [place], [place], [address] Phone: [phone]   Fax:  [phone]  Physical Therapy Treatment  Patient Details  Name: Brandi Crosby MRN: [id number] Date of Birth: 28-Oct-1955 Referring Provider (PT): Dr. Doreatha Martin    Encounter Date: 11/18/2019  PT End of Session - 11/18/19 1100    Visit Number  24    Number of Visits  60    Date for PT Re-Evaluation  12/14/19    Authorization Type  UHC    PT Start Time  1100    PT Stop Time  1150    PT Time Calculation (min)  50 min    Activity Tolerance  Patient tolerated treatment well       Past Medical History:  Diagnosis Date  . Allergy   . Anxiety   . Depression   . Hyperlipemia   . Lung mass   . SCL ca dx'd 08/2017  . Shingles     Past Surgical History:  Procedure Laterality Date  . DENTAL SURGERY  2010   implant  . NODE DISSECTION Left 09/30/2017   Procedure: NODE DISSECTION;  Surgeon: Grace Isaac, MD;  Location: [place];  Service: Thoracic;  Laterality: Left;  . ORIF TIBIA PLATEAU Left 07/05/2019   Procedure: OPEN REDUCTION INTERNAL FIXATION (ORIF) TIBIAL PLATEAU;  Surgeon: Shona Needles, MD;  Location: [place];  Service: Orthopedics;  Laterality: Left;  . RIGHT OOPHORECTOMY    . TONSILLECTOMY AND ADENOIDECTOMY    . VIDEO ASSISTED THORACOSCOPY (VATS)/WEDGE RESECTION Left 09/30/2017   Procedure: VIDEO ASSISTED THORACOSCOPY (VATS)/LUNG RESECTION;  Surgeon: Grace Isaac, MD;  Location: [place];  Service: Thoracic;  Laterality: Left;  [name] VIDEO BRONCHOSCOPY N/A 09/30/2017   Procedure: VIDEO BRONCHOSCOPY;  Surgeon: Grace Isaac, MD;  Location: [facility];  Service: Thoracic;  Laterality: N/A;  . WISDOM TOOTH EXTRACTION      There were no vitals filed for this visit.  Subjective Assessment - 11/18/19 1101    Subjective  Doing well.  Sore after last time but it didn't keep me from doing anything.    Pertinent History  healthy;  goes  by "[name]"    Currently in Pain?  No/denies    Pain Score  0-No pain    Pain Location  Knee    Pain Orientation  Left                        [facility] Adult PT Treatment/Exercise - 11/18/19 0001      Knee/Hip Exercises: Aerobic   Recumbent Bike  L3 5 min       Knee/Hip Exercises: Standing   Heel Raises  Both;10 reps    Heel Raises Limitations  holding 10# weight     Forward Step Up  Left;1 set;10 reps;Hand Hold: 0;Step Height: 8"    Step Down  Left;1 set;5 reps;Hand Hold: 1;Step Height: 4"    Rebounder  3 ways 1 minute each     Walking with Sports Cord  30# backward 10x     Gait Training  kickstand position with large red ball toss     Other Standing Knee Exercises  agility stepping around and over blocks on floor     Other Standing Knee Exercises  10# kickstand dead lifts with a little rotation 10x       Vasopneumatic   Number Minutes Vasopneumatic   10 minutes    Vasopnuematic Location  Knee    Vasopneumatic Pressure  Medium    Vasopneumatic Temperature   34 deg               PT Short Term Goals - 10/19/19 1611      PT SHORT TERM GOAL #1   Title  The patient will demonstrate knowlege of basic self care and ex's to promote ROM and healing    Status  Achieved      PT SHORT TERM GOAL #2   Title  Left knee flexion to 100 degrees needed for greater ease in/out of the car and with sit to stand    Status  Achieved      PT SHORT TERM GOAL #3   Title  The patient will have decreased knee edema with patella circumferential measurement decreased to 36 cm    Status  Achieved      PT SHORT TERM GOAL #4   Title  The patient will be able to ambulate > 300 feet for short to medium community distances    Status  Achieved      PT SHORT TERM GOAL #5   Title  The patient will be able to ascend/descend steps one at a time independently    Status  Achieved        PT Long Term Goals - 10/19/19 1612      PT LONG TERM GOAL #1   Title  The patient will be  independent with a safe self progession of HEP    Time  8    Period  Weeks    Status  On-going    Target Date  12/14/19      PT LONG TERM GOAL #2   Title  The patient will have left knee ROM 0-132 degrees for greater ease ascending and descending stairs    Time  8    Period  Weeks    Status  Revised      PT LONG TERM GOAL #3   Title  The patient will be able to ambulate >1/4 mile needed for work with pain level 2/10 or less    Time  8    Period  Weeks    Status  Revised      PT LONG TERM GOAL #4   Title  The patient will have 4+/5 left knee strength needed for ascending and descending stairs and walking/standing longer periods of time    Time  8    Period  Weeks    Status  Revised      PT LONG TERM GOAL #5   Title  FOTO functional outcome score improved from 53% limitation to 30% indicating improved function with less pain    Time  8    Period  Weeks    Status  On-going            Plan - 11/18/19 1141    Clinical Impression Statement  The patient has much improved balance/proprioception.  Improving quad motor control although visible muscle quivering with fatigue.  She is limited to < 10 reps with more challenging ex's like step downs from a small step.  Improved patellofemoral alignment with no verbal cues needed today.    Examination-Activity Limitations  Locomotion Level;Bathing;Carry;Stairs;Lift;Stand    Examination-Participation Restrictions  Community Activity;Driving;Yard Work;Other;Shop    Rehab Potential  Good    PT Frequency  2x / week    PT Duration  8 weeks    PT Treatment/Interventions  ADLs/Self Care Home Management;Aquatic  Therapy;Cryotherapy;Electrical Stimulation;Neuromuscular re-education;Therapeutic exercise;Therapeutic activities;Stair training;Gait training;Patient/family education;Manual techniques;Dry needling;Taping;Vasopneumatic Device    PT Next Visit Plan  foot prop on BOSU with rebounder;  left LE strengthening progression, reciprocal stair  climbing; increase gait speed; vasocompression    PT Home Exercise Plan  Access Code: [email or web address]       Patient will benefit from skilled therapeutic intervention in order to improve the following deficits and impairments:  Abnormal gait, Decreased range of motion, Difficulty walking, Pain, Decreased activity tolerance, Impaired perceived functional ability, Increased edema, Decreased strength  Visit Diagnosis: Muscle weakness (generalized)  Acute pain of left knee  Stiffness of left knee, not elsewhere classified  Localized edema     Problem List Patient Active Problem List   Diagnosis Date Noted  . Fracture of tibial shaft, left, closed 07/05/2019  . Acute lateral meniscus tear of left knee 07/05/2019  . Tibial plateau fracture, left 07/05/2019  . Closed bicondylar fracture of left tibial plateau 07/04/2019  . Encounter for antineoplastic chemotherapy 10/27/2017  . 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 Goals of care, counseling/discussion 10/11/2017  . Encounter for smoking cessation counseling 10/11/2017  . S/P lobectomy of lung 09/30/2017   Ruben Im, PT 11/18/19 5:27 PM Phone: [phone] Fax: [phone] Alvera Singh 11/18/2019, 5:27 PM  [facility] [address], [place], [place], [address] Phone: [phone]   Fax:  [phone]  Name: Rosalena Mccorry MRN: [id number] Date of Birth: 05-05-1956

## 2019-11-25 ENCOUNTER — Other Ambulatory Visit: Payer: Self-pay

## 2019-11-25 ENCOUNTER — Ambulatory Visit: Payer: 59 | Attending: Student | Admitting: Physical Therapy

## 2019-11-25 DIAGNOSIS — M25562 Pain in left knee: Secondary | ICD-10-CM | POA: Insufficient documentation

## 2019-11-25 DIAGNOSIS — R600 Localized edema: Secondary | ICD-10-CM | POA: Diagnosis present

## 2019-11-25 DIAGNOSIS — M6281 Muscle weakness (generalized): Secondary | ICD-10-CM

## 2019-11-25 DIAGNOSIS — M25662 Stiffness of left knee, not elsewhere classified: Secondary | ICD-10-CM | POA: Diagnosis present

## 2019-11-25 NOTE — Therapy (Signed)
[facility] [address], [place], [place], [address] Phone: [phone]   Fax:  [phone]  Physical Therapy Treatment  Patient Details  Name: Brandi Crosby MRN: [id number] Date of Birth: 09-12-1955 Referring Provider (PT): Dr. Doreatha Martin    Encounter Date: 11/25/2019  PT End of Session - 11/25/19 1709    Visit Number  25    Number of Visits  60    Date for PT Re-Evaluation  12/14/19    Authorization Type  UHC    PT Start Time  1100    PT Stop Time  1150    PT Time Calculation (min)  50 min    Activity Tolerance  Patient tolerated treatment well       Past Medical History:  Diagnosis Date  . Allergy   . Anxiety   . Depression   . Hyperlipemia   . Lung mass   . SCL ca dx'd 08/2017  . Shingles     Past Surgical History:  Procedure Laterality Date  . DENTAL SURGERY  2010   implant  . NODE DISSECTION Left 09/30/2017   Procedure: NODE DISSECTION;  Surgeon: Grace Isaac, MD;  Location: [place];  Service: Thoracic;  Laterality: Left;  . ORIF TIBIA PLATEAU Left 07/05/2019   Procedure: OPEN REDUCTION INTERNAL FIXATION (ORIF) TIBIAL PLATEAU;  Surgeon: Shona Needles, MD;  Location: [place];  Service: Orthopedics;  Laterality: Left;  . RIGHT OOPHORECTOMY    . TONSILLECTOMY AND ADENOIDECTOMY    . VIDEO ASSISTED THORACOSCOPY (VATS)/WEDGE RESECTION Left 09/30/2017   Procedure: VIDEO ASSISTED THORACOSCOPY (VATS)/LUNG RESECTION;  Surgeon: Grace Isaac, MD;  Location: [place];  Service: Thoracic;  Laterality: Left;  [name] VIDEO BRONCHOSCOPY N/A 09/30/2017   Procedure: VIDEO BRONCHOSCOPY;  Surgeon: Grace Isaac, MD;  Location: [facility] OR;  Service: Thoracic;  Laterality: N/A;  . WISDOM TOOTH EXTRACTION      There were no vitals filed for this visit.  Subjective Assessment - 11/25/19 1100    Subjective  I walked 1.2 miles over the weekend.  Trimmed bushes.  It feels good.  Minimal soreness.  I vacumned the house.    Pertinent History  healthy;  goes by "[name]"    How long can you walk comfortably?  on my feet an hour    Currently in Pain?  No/denies    Pain Score  0-No pain    Pain Location  Knee    Pain Orientation  Left                        [facility] Adult PT Treatment/Exercise - 11/25/19 0001      Knee/Hip Exercises: Stretches   Other Knee/Hip Stretches  2nd step ROM 20x       Knee/Hip Exercises: Aerobic   Recumbent Bike  L3 5 min       Knee/Hip Exercises: Standing   Forward Step Up Limitations  1 minute holding 5# weight     Step Down  Left;Step Height: 4"    Lunge Walking - Round Trips  carrying 7# in each hand while walking 1 minute     Stairs  8 inch step ups 10x     SLS with Vectors  SLS with green band UE diagonals 10x each way     Walking with Sports Cord  forward walk outs 10x 30#     Other Standing Knee Exercises  dead lifts 25# 1 minute  Other Standing Knee Exercises  holding red band arms extended forward  with right forward and back 30 sec each way       Vasopneumatic   Number Minutes Vasopneumatic   10 minutes    Vasopnuematic Location   Knee    Vasopneumatic Pressure  Medium    Vasopneumatic Temperature   34 deg               PT Short Term Goals - 10/19/19 1611      PT SHORT TERM GOAL #1   Title  The patient will demonstrate knowlege of basic self care and ex's to promote ROM and healing    Status  Achieved      PT SHORT TERM GOAL #2   Title  Left knee flexion to 100 degrees needed for greater ease in/out of the car and with sit to stand    Status  Achieved      PT SHORT TERM GOAL #3   Title  The patient will have decreased knee edema with patella circumferential measurement decreased to 36 cm    Status  Achieved      PT SHORT TERM GOAL #4   Title  The patient will be able to ambulate > 300 feet for short to medium community distances    Status  Achieved      PT SHORT TERM GOAL #5   Title  The patient will be able to ascend/descend  steps one at a time independently    Status  Achieved        PT Long Term Goals - 11/25/19 1713      PT LONG TERM GOAL #1   Title  The patient will be independent with a safe self progession of HEP    Time  8    Period  Weeks    Status  On-going      PT LONG TERM GOAL #2   Title  The patient will have left knee ROM 0-132 degrees for greater ease ascending and descending stairs    Time  8    Period  Weeks    Status  On-going      PT LONG TERM GOAL #3   Title  The patient will be able to ambulate >1/4 mile needed for work with pain level 2/10 or less    Status  Achieved      PT LONG TERM GOAL #4   Title  The patient will have 4+/5 left knee strength needed for ascending and descending stairs and walking/standing longer periods of time    Time  8    Period  Weeks    Status  On-going      PT LONG TERM GOAL #5   Title  FOTO functional outcome score improved from 53% limitation to 30% indicating improved function with less pain    Time  8    Period  Weeks    Status  On-going            Plan - 11/25/19 1709    Clinical Impression Statement  The patient is progressing very well with return to function, ROM and strength.  She is able to walk > 1 mile and overall spend about an hour on her feet doing household chores and some yardwork.  She is able to perform more advanced strengthening and proprioceptive ex's with less UE support.  Decreased quad motor control with step downs persists.   Decreasing lower leg and knee edema.  She  should meet remaining goals in the next 2 weeks.    Rehab Potential  Good    PT Frequency  2x / week    PT Duration  8 weeks    PT Treatment/Interventions  ADLs/Self Care Home Management;Aquatic Therapy;Cryotherapy;Electrical Stimulation;Neuromuscular re-education;Therapeutic exercise;Therapeutic activities;Stair training;Gait training;Patient/family education;Manual techniques;Dry needling;Taping;Vasopneumatic Device    PT Next Visit Plan  left LE  strengthening progression,  vasocompression; prepare for discharge in 2 weeks    PT Home Exercise Plan  Access Code: [email or web address]       Patient will benefit from skilled therapeutic intervention in order to improve the following deficits and impairments:  Abnormal gait, Decreased range of motion, Difficulty walking, Pain, Decreased activity tolerance, Impaired perceived functional ability, Increased edema, Decreased strength  Visit Diagnosis: Muscle weakness (generalized)  Acute pain of left knee  Stiffness of left knee, not elsewhere classified  Localized edema     Problem List Patient Active Problem List   Diagnosis Date Noted  . Fracture of tibial shaft, left, closed 07/05/2019  . Acute lateral meniscus tear of left knee 07/05/2019  . Tibial plateau fracture, left 07/05/2019  . Closed bicondylar fracture of left tibial plateau 07/04/2019  . Encounter for antineoplastic chemotherapy 10/27/2017  . 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 Goals of care, counseling/discussion 10/11/2017  . Encounter for smoking cessation counseling 10/11/2017  . S/P lobectomy of lung 09/30/2017   Ruben Im, PT 11/25/19 5:15 PM Phone: [phone] Fax: [phone] Alvera Singh 11/25/2019, 5:15 PM  [facility] [address], [place], [place], [address] Phone: [phone]   Fax:  [phone]  Name: Brandi Crosby MRN: [id number] Date of Birth: 09-04-1955

## 2019-11-30 ENCOUNTER — Other Ambulatory Visit: Payer: Self-pay

## 2019-11-30 ENCOUNTER — Ambulatory Visit: Payer: 59 | Admitting: Physical Therapy

## 2019-11-30 DIAGNOSIS — M6281 Muscle weakness (generalized): Secondary | ICD-10-CM

## 2019-11-30 DIAGNOSIS — M25562 Pain in left knee: Secondary | ICD-10-CM

## 2019-11-30 DIAGNOSIS — R600 Localized edema: Secondary | ICD-10-CM

## 2019-11-30 DIAGNOSIS — M25662 Stiffness of left knee, not elsewhere classified: Secondary | ICD-10-CM

## 2019-11-30 NOTE — Therapy (Signed)
[facility] [address], [place], [place], [address] Phone: [phone]   Fax:  [phone]  Physical Therapy Treatment  Patient Details  Name: Brandi Crosby MRN: [id number] Date of Birth: 03-02-1956 Referring Provider (PT): Dr. Doreatha Martin    Encounter Date: 11/30/2019  PT End of Session - 11/30/19 1239    Visit Number  26    Number of Visits  60    Date for PT Re-Evaluation  12/14/19    Authorization Type  UHC    PT Start Time  0867    PT Stop Time  1234    PT Time Calculation (min)  49 min    Activity Tolerance  Patient tolerated treatment well       Past Medical History:  Diagnosis Date  . Allergy   . Anxiety   . Depression   . Hyperlipemia   . Lung mass   . SCL ca dx'd 08/2017  . Shingles     Past Surgical History:  Procedure Laterality Date  . DENTAL SURGERY  2010   implant  . NODE DISSECTION Left 09/30/2017   Procedure: NODE DISSECTION;  Surgeon: Grace Isaac, MD;  Location: [place];  Service: Thoracic;  Laterality: Left;  . ORIF TIBIA PLATEAU Left 07/05/2019   Procedure: OPEN REDUCTION INTERNAL FIXATION (ORIF) TIBIAL PLATEAU;  Surgeon: Shona Needles, MD;  Location: [place];  Service: Orthopedics;  Laterality: Left;  . RIGHT OOPHORECTOMY    . TONSILLECTOMY AND ADENOIDECTOMY    . VIDEO ASSISTED THORACOSCOPY (VATS)/WEDGE RESECTION Left 09/30/2017   Procedure: VIDEO ASSISTED THORACOSCOPY (VATS)/LUNG RESECTION;  Surgeon: Grace Isaac, MD;  Location: [place];  Service: Thoracic;  Laterality: Left;  [name] VIDEO BRONCHOSCOPY N/A 09/30/2017   Procedure: VIDEO BRONCHOSCOPY;  Surgeon: Grace Isaac, MD;  Location: [facility] OR;  Service: Thoracic;  Laterality: N/A;  . WISDOM TOOTH EXTRACTION      There were no vitals filed for this visit.  Subjective Assessment - 11/30/19 1148    Subjective  Pt states things are going well. She is doing alot of stuff.    Pertinent History  healthy;  goes by "[name]"     How long can you walk comfortably?  on my feet an hour    Currently in Pain?  No/denies                        [facility] Adult PT Treatment/Exercise - 11/30/19 0001      Knee/Hip Exercises: Aerobic   Recumbent Bike  L3 x5 min      Knee/Hip Exercises: Standing   Heel Raises  --    Abduction Limitations  sidestepping with red TB around feet 3x10 reps each side     Forward Step Up  Left;2 sets;10 reps;Step Height: 8"    Forward Step Up Limitations  holding #5 dumbbell in Rt UE    Step Down  Left;2 sets;10 reps    SLS with Vectors  Lt on foam: 5x5 sec hold each direction    Walking with Sports Cord  #30 forward walkout x10 reps holding handles    Other Standing Knee Exercises  single leg eccentric sit with B ski poles 2x10 reps       Knee/Hip Exercises: Prone   Hamstring Curl  2 sets;10 reps    Hamstring Curl Limitations  red TB      Vasopneumatic   Number Minutes Vasopneumatic   10 minutes  Vasopnuematic Location   Knee    Vasopneumatic Pressure  Medium    Vasopneumatic Temperature   34 deg             PT Education - 11/30/19 1239    Education Details  technique with therex    Person(s) Educated  Patient    Methods  Explanation;Verbal cues    Comprehension  Returned demonstration;Verbalized understanding       PT Short Term Goals - 10/19/19 1611      PT SHORT TERM GOAL #1   Title  The patient will demonstrate knowlege of basic self care and ex's to promote ROM and healing    Status  Achieved      PT SHORT TERM GOAL #2   Title  Left knee flexion to 100 degrees needed for greater ease in/out of the car and with sit to stand    Status  Achieved      PT SHORT TERM GOAL #3   Title  The patient will have decreased knee edema with patella circumferential measurement decreased to 36 cm    Status  Achieved      PT SHORT TERM GOAL #4   Title  The patient will be able to ambulate > 300 feet for short to medium community distances    Status  Achieved       PT SHORT TERM GOAL #5   Title  The patient will be able to ascend/descend steps one at a time independently    Status  Achieved        PT Long Term Goals - 11/25/19 1713      PT LONG TERM GOAL #1   Title  The patient will be independent with a safe self progession of HEP    Time  8    Period  Weeks    Status  On-going      PT LONG TERM GOAL #2   Title  The patient will have left knee ROM 0-132 degrees for greater ease ascending and descending stairs    Time  8    Period  Weeks    Status  On-going      PT LONG TERM GOAL #3   Title  The patient will be able to ambulate >1/4 mile needed for work with pain level 2/10 or less    Status  Achieved      PT LONG TERM GOAL #4   Title  The patient will have 4+/5 left knee strength needed for ascending and descending stairs and walking/standing longer periods of time    Time  8    Period  Weeks    Status  On-going      PT LONG TERM GOAL #5   Title  FOTO functional outcome score improved from 53% limitation to 30% indicating improved function with less pain    Time  8    Period  Weeks    Status  On-going            Plan - 11/30/19 1239    Clinical Impression Statement  Today's session continued with focus on therex to increase Lt quad and hip strength/endurance. Pt did well with step ups and single leg activity, but she had increased difficulty with eccentric control during step downs secondary to muscle weakness. Pt denied pain with all of today's exercises and reported muscle fatigue as expected. Ended with game ready to decrease swelling in the Lt LE following heavy activity. Will continue with current POC.  Rehab Potential  Good    PT Frequency  2x / week    PT Duration  8 weeks    PT Treatment/Interventions  ADLs/Self Care Home Management;Aquatic Therapy;Cryotherapy;Electrical Stimulation;Neuromuscular re-education;Therapeutic exercise;Therapeutic activities;Stair training;Gait training;Patient/family education;Manual  techniques;Dry needling;Taping;Vasopneumatic Device    PT Next Visit Plan  left LE strengthening progression,  vasocompression; prepare for discharge in 2 weeks    PT Home Exercise Plan  Access Code: [email or web address]       Patient will benefit from skilled therapeutic intervention in order to improve the following deficits and impairments:  Abnormal gait, Decreased range of motion, Difficulty walking, Pain, Decreased activity tolerance, Impaired perceived functional ability, Increased edema, Decreased strength  Visit Diagnosis: Muscle weakness (generalized)  Acute pain of left knee  Stiffness of left knee, not elsewhere classified  Localized edema     Problem List Patient Active Problem List   Diagnosis Date Noted  . Fracture of tibial shaft, left, closed 07/05/2019  . Acute lateral meniscus tear of left knee 07/05/2019  . Tibial plateau fracture, left 07/05/2019  . Closed bicondylar fracture of left tibial plateau 07/04/2019  . Encounter for antineoplastic chemotherapy 10/27/2017  . 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 Goals of care, counseling/discussion 10/11/2017  . Encounter for smoking cessation counseling 10/11/2017  . S/P lobectomy of lung 09/30/2017    12:44 PM,11/30/19 Sherol Dade PT, DPT [place] [facility] [address] [place], [place], [address] Phone: [phone]   Fax:  [phone]  Name: Brandi Crosby MRN: [id number] Date of Birth: 1955-08-20

## 2019-12-02 ENCOUNTER — Ambulatory Visit: Payer: 59 | Admitting: Physical Therapy

## 2019-12-02 ENCOUNTER — Other Ambulatory Visit: Payer: Self-pay

## 2019-12-02 DIAGNOSIS — M25662 Stiffness of left knee, not elsewhere classified: Secondary | ICD-10-CM

## 2019-12-02 DIAGNOSIS — M25562 Pain in left knee: Secondary | ICD-10-CM

## 2019-12-02 DIAGNOSIS — M6281 Muscle weakness (generalized): Secondary | ICD-10-CM

## 2019-12-02 DIAGNOSIS — R600 Localized edema: Secondary | ICD-10-CM

## 2019-12-02 NOTE — Therapy (Signed)
[facility] [address] [place], [place], [address] Phone: [phone]   Fax:  [phone]  Physical Therapy Treatment  Patient Details  Name: Brandi Crosby MRN: [id number] Date of Birth: [birth year] Referring Provider (PT): Dr. Doreatha Martin    Encounter Date: 12/02/2019   PT End of Session - 12/02/19 1927    Visit Number 27    Number of Visits 60    Date for PT Re-Evaluation 12/14/19    Authorization Type UHC    PT Start Time 9242    PT Stop Time 1104    PT Time Calculation (min) 49 min    Activity Tolerance Patient tolerated treatment well           Past Medical History:  Diagnosis Date  . Allergy   . Anxiety   . Depression   . Hyperlipemia   . Lung mass   . SCL ca dx'd 08/2017  . Shingles     Past Surgical History:  Procedure Laterality Date  . DENTAL SURGERY  2010   implant  . NODE DISSECTION Left 09/30/2017   Procedure: NODE DISSECTION;  Surgeon: Grace Isaac, MD;  Location: [place];  Service: Thoracic;  Laterality: Left;  . ORIF TIBIA PLATEAU Left 07/05/2019   Procedure: OPEN REDUCTION INTERNAL FIXATION (ORIF) TIBIAL PLATEAU;  Surgeon: Shona Needles, MD;  Location: [place];  Service: Orthopedics;  Laterality: Left;  . RIGHT OOPHORECTOMY    . TONSILLECTOMY AND ADENOIDECTOMY    . VIDEO ASSISTED THORACOSCOPY (VATS)/WEDGE RESECTION Left 09/30/2017   Procedure: VIDEO ASSISTED THORACOSCOPY (VATS)/LUNG RESECTION;  Surgeon: Grace Isaac, MD;  Location: [place];  Service: Thoracic;  Laterality: Left;  [name] VIDEO BRONCHOSCOPY N/A 09/30/2017   Procedure: VIDEO BRONCHOSCOPY;  Surgeon: Grace Isaac, MD;  Location: [facility] OR;  Service: Thoracic;  Laterality: N/A;  . WISDOM TOOTH EXTRACTION      There were no vitals filed for this visit.   Subjective Assessment - 12/02/19 1017    Subjective My butt was so sore after last time.  I haven't tried getting up on a step stool to water the plants on my front  porch.  I still don't walk as fast as I used to.  I tried skipping yesterday and that hurt.    Pertinent History healthy;  goes by "Brandi Crosby"    Currently in Pain? No/denies    Pain Score 0-No pain    Pain Orientation Left                             [facility] Adult PT Treatment/Exercise - 12/02/19 0001      Knee/Hip Exercises: Stretches   Other Knee/Hip Stretches 2nd step ROM 20x       Knee/Hip Exercises: Aerobic   Recumbent Bike L3 x7 min      Knee/Hip Exercises: Machines for Strengthening   Cybex Knee Extension 5# bil 8x eccentric returns     Cybex Knee Flexion 20# bil 10x eccentric returns    Cybex Leg Press        Knee/Hip Exercises: Standing   Forward Step Up Left;2 sets;5 reps;Hand Hold: 0;Step Height: 8"    Forward Step Up Limitations reach forward and also right chair taps    Lunge Walking - Round Trips carrying 7# in each hand mini lunges     Gait Training march while carrying 7# in each hand     Other Standing Knee  Exercises bil heel raises with 2 7 # weights     Other Standing Knee Exercises floor sliders hip abduction/extension  10x     Knee/Hip Exercises: Supine   Bridges Right;Left;Both;3 sets;5 reps    Single Leg Bridge Strengthening;Right;Left;5 reps    Other Supine Knee/Hip Exercises dead bug 2x 10       Knee/Hip Exercises: Sidelying   Other Sidelying Knee/Hip Exercises side planks right with left hip abduction 3x       Knee/Hip Exercises: Prone   Other Prone Exercises plank forearms/toes 15 sec       Vasopneumatic   Number Minutes Vasopneumatic  10 minutes    Vasopnuematic Location  Knee    Vasopneumatic Pressure Medium    Vasopneumatic Temperature  34 deg                    PT Short Term Goals - 10/19/19 1611      PT SHORT TERM GOAL #1   Title The patient will demonstrate knowlege of basic self care and ex's to promote ROM and healing    Status Achieved      PT SHORT TERM GOAL #2   Title Left knee flexion to 100 degrees  needed for greater ease in/out of the car and with sit to stand    Status Achieved      PT SHORT TERM GOAL #3   Title The patient will have decreased knee edema with patella circumferential measurement decreased to 36 cm    Status Achieved      PT SHORT TERM GOAL #4   Title The patient will be able to ambulate > 300 feet for short to medium community distances    Status Achieved      PT SHORT TERM GOAL #5   Title The patient will be able to ascend/descend steps one at a time independently    Status Achieved             PT Long Term Goals - 11/25/19 1713      PT LONG TERM GOAL #1   Title The patient will be independent with a safe self progession of HEP    Time 8    Period Weeks    Status On-going      PT LONG TERM GOAL #2   Title The patient will have left knee ROM 0-132 degrees for greater ease ascending and descending stairs    Time 8    Period Weeks    Status On-going      PT LONG TERM GOAL #3   Title The patient will be able to ambulate >1/4 mile needed for work with pain level 2/10 or less    Status Achieved      PT LONG TERM GOAL #4   Title The patient will have 4+/5 left knee strength needed for ascending and descending stairs and walking/standing longer periods of time    Time 8    Period Weeks    Status On-going      PT LONG TERM GOAL #5   Title FOTO functional outcome score improved from 53% limitation to 30% indicating improved function with less pain    Time 8    Period Weeks    Status On-going                 Plan - 12/02/19 1928    Clinical Impression Statement Treatment focus on quad strengthening as well as functional strengthening including stepping on a higher  step and reaching to simulate watering her hanging plants.  Also focused on lumbo/pelvic/hip core strength needed for higher level functional activities.  Muscle fatigue reported in quad muscles.  Mild lower leg edema decreased following vasocompression.    Examination-Activity  Limitations Locomotion Level;Bathing;Carry;Stairs;Lift;Stand    Examination-Participation Restrictions Community Activity;Driving;Yard Work;Other;Shop    Rehab Potential Good    PT Frequency 2x / week    PT Duration 8 weeks    PT Treatment/Interventions ADLs/Self Care Home Management;Aquatic Therapy;Cryotherapy;Electrical Stimulation;Neuromuscular re-education;Therapeutic exercise;Therapeutic activities;Stair training;Gait training;Patient/family education;Manual techniques;Dry needling;Taping;Vasopneumatic Device    PT Next Visit Plan left LE strengthening progression,  vasocompression; prepare for discharge in 3 visits   PT Home Exercise Plan Access Code: [email or web address]           Patient will benefit from skilled therapeutic intervention in order to improve the following deficits and impairments:  Abnormal gait, Decreased range of motion, Difficulty walking, Pain, Decreased activity tolerance, Impaired perceived functional ability, Increased edema, Decreased strength  Visit Diagnosis: Muscle weakness (generalized)  Acute pain of left knee  Stiffness of left knee, not elsewhere classified  Localized edema     Problem List Patient Active Problem List   Diagnosis Date Noted  . Fracture of tibial shaft, left, closed 07/05/2019  . Acute lateral meniscus tear of left knee 07/05/2019  . Tibial plateau fracture, left 07/05/2019  . Closed bicondylar fracture of left tibial plateau 07/04/2019  . Encounter for antineoplastic chemotherapy 10/27/2017  . 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 Goals of care, counseling/discussion 10/11/2017  . Encounter for smoking cessation counseling 10/11/2017  . S/P lobectomy of lung 09/30/2017   Ruben Im, PT 12/02/19 7:35 PM Phone: [phone] Fax: [phone] Alvera Singh 12/02/2019, 7:35 PM  [facility] [address] [place], [place],  [address] Phone: [phone]   Fax:  [phone]  Name: Brandi Crosby MRN: [id number] Date of Birth: [birth year]

## 2019-12-07 ENCOUNTER — Ambulatory Visit
Admission: RE | Admit: 2019-12-07 | Discharge: 2019-12-07 | Disposition: A | Discharge status: Home / Self Care | Payer: 59 | Source: Ambulatory Visit | Attending: Nurse Practitioner | Admitting: Nurse Practitioner

## 2019-12-07 ENCOUNTER — Other Ambulatory Visit: Payer: Self-pay

## 2019-12-07 DIAGNOSIS — Z780 Asymptomatic menopausal state: Secondary | ICD-10-CM

## 2019-12-08 ENCOUNTER — Ambulatory Visit: Payer: 59 | Admitting: Physical Therapy

## 2019-12-08 ENCOUNTER — Encounter: Payer: Self-pay | Admitting: Physical Therapy

## 2019-12-08 DIAGNOSIS — R600 Localized edema: Secondary | ICD-10-CM

## 2019-12-08 DIAGNOSIS — M25662 Stiffness of left knee, not elsewhere classified: Secondary | ICD-10-CM

## 2019-12-08 DIAGNOSIS — M25562 Pain in left knee: Secondary | ICD-10-CM

## 2019-12-08 DIAGNOSIS — M6281 Muscle weakness (generalized): Secondary | ICD-10-CM | POA: Diagnosis not present

## 2019-12-08 NOTE — Therapy (Signed)
[facility] [address] [place], [place], [address] Phone: [phone]   Fax:  [phone]  Physical Therapy Treatment  Patient Details  Name: Brandi Crosby MRN: [id number] Date of Birth: 22-Apr-1956 Referring Provider (PT): Dr. Doreatha Martin    Encounter Date: 12/08/2019   PT End of Session - 12/08/19 1317    Visit Number 28    Number of Visits 60    Date for PT Re-Evaluation 12/14/19    Authorization Type UHC    PT Start Time 8546    PT Stop Time 2703    PT Time Calculation (min) 49 min    Activity Tolerance Patient tolerated treatment well;No increased pain    Behavior During Therapy WFL for tasks assessed/performed           Past Medical History:  Diagnosis Date  . Allergy   . Anxiety   . Depression   . Hyperlipemia   . Lung mass   . SCL ca dx'd 08/2017  . Shingles     Past Surgical History:  Procedure Laterality Date  . DENTAL SURGERY  2010   implant  . NODE DISSECTION Left 09/30/2017   Procedure: NODE DISSECTION;  Surgeon: Grace Isaac, MD;  Location: [place];  Service: Thoracic;  Laterality: Left;  . ORIF TIBIA PLATEAU Left 07/05/2019   Procedure: OPEN REDUCTION INTERNAL FIXATION (ORIF) TIBIAL PLATEAU;  Surgeon: Shona Needles, MD;  Location: [place];  Service: Orthopedics;  Laterality: Left;  . RIGHT OOPHORECTOMY    . TONSILLECTOMY AND ADENOIDECTOMY    . VIDEO ASSISTED THORACOSCOPY (VATS)/WEDGE RESECTION Left 09/30/2017   Procedure: VIDEO ASSISTED THORACOSCOPY (VATS)/LUNG RESECTION;  Surgeon: Grace Isaac, MD;  Location: [place];  Service: Thoracic;  Laterality: Left;  [name] VIDEO BRONCHOSCOPY N/A 09/30/2017   Procedure: VIDEO BRONCHOSCOPY;  Surgeon: Grace Isaac, MD;  Location: [facility];  Service: Thoracic;  Laterality: N/A;  . WISDOM TOOTH EXTRACTION      There were no vitals filed for this visit.   Subjective Assessment - 12/08/19 1147    Subjective Pt states that things are going  well. She is still not comfortable stepping up into a chair.    Pertinent History healthy;  goes by "Brandi Crosby"    Currently in Pain? No/denies                             [facility] Adult PT Treatment/Exercise - 12/08/19 0001      Knee/Hip Exercises: Aerobic   Recumbent Bike L3 x7 min      Knee/Hip Exercises: Machines for Strengthening   Cybex Knee Extension #15 2x5 reps concentric both, primarily Lt eccentric    Cybex Knee Flexion #20 x10 concentric both, eccentric Lt      Knee/Hip Exercises: Standing   Forward Step Up Left;2 sets;10 reps    Other Standing Knee Exercises sit to stand with Lt LE eccentric - bilateral ski poles     Other Standing Knee Exercises half kneel to stand from 4" box with foam pad 2x10 reps UE support ski poles       Knee/Hip Exercises: Supine   Straight Leg Raises Left;Strengthening;20 reps      Knee/Hip Exercises: Sidelying   Clams 2x10 reps yellow TB- Lt only       Vasopneumatic   Number Minutes Vasopneumatic  10 minutes    Vasopnuematic Location  Knee    Vasopneumatic Pressure Medium  Vasopneumatic Temperature  34 deg                  PT Education - 12/08/19 1315    Education Details technique with therex    Person(s) Educated Patient    Methods Explanation;Verbal cues    Comprehension Verbalized understanding;Returned demonstration            PT Short Term Goals - 10/19/19 1611      PT SHORT TERM GOAL #1   Title The patient will demonstrate knowlege of basic self care and ex's to promote ROM and healing    Status Achieved      PT SHORT TERM GOAL #2   Title Left knee flexion to 100 degrees needed for greater ease in/out of the car and with sit to stand    Status Achieved      PT SHORT TERM GOAL #3   Title The patient will have decreased knee edema with patella circumferential measurement decreased to 36 cm    Status Achieved      PT SHORT TERM GOAL #4   Title The patient will be able to ambulate > 300 feet for  short to medium community distances    Status Achieved      PT SHORT TERM GOAL #5   Title The patient will be able to ascend/descend steps one at a time independently    Status Achieved             PT Long Term Goals - 11/25/19 1713      PT LONG TERM GOAL #1   Title The patient will be independent with a safe self progession of HEP    Time 8    Period Weeks    Status On-going      PT LONG TERM GOAL #2   Title The patient will have left knee ROM 0-132 degrees for greater ease ascending and descending stairs    Time 8    Period Weeks    Status On-going      PT LONG TERM GOAL #3   Title The patient will be able to ambulate >1/4 mile needed for work with pain level 2/10 or less    Status Achieved      PT LONG TERM GOAL #4   Title The patient will have 4+/5 left knee strength needed for ascending and descending stairs and walking/standing longer periods of time    Time 8    Period Weeks    Status On-going      PT LONG TERM GOAL #5   Title FOTO functional outcome score improved from 53% limitation to 30% indicating improved function with less pain    Time 8    Period Weeks    Status On-going                 Plan - 12/08/19 1317    Clinical Impression Statement Today's session continued with focus on increasing Lt LE strength. Focused on eccentric portion of leg extension and leg curl, and pt was able to progress resistance with this today. Pt had visible muscle shaking during lunge exercise secondar to quadriceps fatigue.    Examination-Activity Limitations Locomotion Level;Bathing;Carry;Stairs;Lift;Stand    Examination-Participation Restrictions Community Activity;Driving;Yard Work;Other;Shop    Rehab Potential Good    PT Frequency 2x / week    PT Duration 8 weeks    PT Treatment/Interventions ADLs/Self Care Home Management;Aquatic Therapy;Cryotherapy;Electrical Stimulation;Neuromuscular re-education;Therapeutic exercise;Therapeutic activities;Stair training;Gait  training;Patient/family education;Manual techniques;Dry needling;Taping;Vasopneumatic Device  PT Next Visit Plan left LE strengthening progression,  vasocompression; begin to finalize HEP;  do 6 MWT    PT Home Exercise Plan Access Code: [email or web address]           Patient will benefit from skilled therapeutic intervention in order to improve the following deficits and impairments:  Abnormal gait, Decreased range of motion, Difficulty walking, Pain, Decreased activity tolerance, Impaired perceived functional ability, Increased edema, Decreased strength  Visit Diagnosis: Muscle weakness (generalized)  Acute pain of left knee  Stiffness of left knee, not elsewhere classified  Localized edema     Problem List Patient Active Problem List   Diagnosis Date Noted  . Fracture of tibial shaft, left, closed 07/05/2019  . Acute lateral meniscus tear of left knee 07/05/2019  . Tibial plateau fracture, left 07/05/2019  . Closed bicondylar fracture of left tibial plateau 07/04/2019  . Encounter for antineoplastic chemotherapy 10/27/2017  . 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 Goals of care, counseling/discussion 10/11/2017  . Encounter for smoking cessation counseling 10/11/2017  . S/P lobectomy of lung 09/30/2017    1:22 PM,12/08/19 Sherol Dade PT, DPT [facility] [address] [place], [place], [address] Phone: [phone]   Fax:  [phone]  Name: Brandi Crosby MRN: [id number] Date of Birth: 12/03/1955

## 2019-12-09 ENCOUNTER — Ambulatory Visit: Payer: 59 | Admitting: Physical Therapy

## 2019-12-09 ENCOUNTER — Other Ambulatory Visit: Payer: Self-pay

## 2019-12-09 DIAGNOSIS — R600 Localized edema: Secondary | ICD-10-CM

## 2019-12-09 DIAGNOSIS — M25562 Pain in left knee: Secondary | ICD-10-CM

## 2019-12-09 DIAGNOSIS — M25662 Stiffness of left knee, not elsewhere classified: Secondary | ICD-10-CM

## 2019-12-09 DIAGNOSIS — M6281 Muscle weakness (generalized): Secondary | ICD-10-CM | POA: Diagnosis not present

## 2019-12-09 NOTE — Therapy (Signed)
[facility] [address], [place], [place], [address] Phone: [phone]   Fax:  [phone]  Physical Therapy Treatment  Patient Details  Name: Brandi Crosby MRN: [id number] Date of Birth: 01-31-1956 Referring Provider (PT): Dr. Doreatha Martin    Encounter Date: 12/09/2019   PT End of Session - 12/09/19 1055    Visit Number 29    Number of Visits 60    Date for PT Re-Evaluation 12/14/19    Authorization Type UHC    PT Start Time 8338    PT Stop Time 1100    PT Time Calculation (min) 45 min    Activity Tolerance Patient tolerated treatment well;No increased pain           Past Medical History:  Diagnosis Date  . Allergy   . Anxiety   . Depression   . Hyperlipemia   . Lung mass   . SCL ca dx'd 08/2017  . Shingles     Past Surgical History:  Procedure Laterality Date  . DENTAL SURGERY  2010   implant  . NODE DISSECTION Left 09/30/2017   Procedure: NODE DISSECTION;  Surgeon: Grace Isaac, MD;  Location: [place];  Service: Thoracic;  Laterality: Left;  . ORIF TIBIA PLATEAU Left 07/05/2019   Procedure: OPEN REDUCTION INTERNAL FIXATION (ORIF) TIBIAL PLATEAU;  Surgeon: Shona Needles, MD;  Location: [place];  Service: Orthopedics;  Laterality: Left;  . RIGHT OOPHORECTOMY    . TONSILLECTOMY AND ADENOIDECTOMY    . VIDEO ASSISTED THORACOSCOPY (VATS)/WEDGE RESECTION Left 09/30/2017   Procedure: VIDEO ASSISTED THORACOSCOPY (VATS)/LUNG RESECTION;  Surgeon: Grace Isaac, MD;  Location: [place];  Service: Thoracic;  Laterality: Left;  [name] VIDEO BRONCHOSCOPY N/A 09/30/2017   Procedure: VIDEO BRONCHOSCOPY;  Surgeon: Grace Isaac, MD;  Location: [facility] OR;  Service: Thoracic;  Laterality: N/A;  . WISDOM TOOTH EXTRACTION      There were no vitals filed for this visit.   Subjective Assessment - 12/09/19 1016    Subjective I had a bone density test and it's borderline.  She recommended I go on a medication but I don't  want to go on it.    Pertinent History healthy;  goes by "[name]"    Currently in Pain? No/denies    Pain Score 0-No pain                             [facility] Adult PT Treatment/Exercise - 12/09/19 0001      Knee/Hip Exercises: Stretches   Other Knee/Hip Stretches 2nd step ROM 20x       Knee/Hip Exercises: Aerobic   Recumbent Bike L3 x5 min      Knee/Hip Exercises: Standing   Forward Step Up Left;2 sets;10 reps   holding 5# weight    Functional Squat Limitations 15# kettle bell squat with single bicep curls 10x     Lunge Walking - Round Trips step back while holding 5# weight at shoulder 10x on each side     SLS with Vectors 5# single leg dead lifts 10x     Gait Training 15# kettle bell swings 10x     Other Standing Knee Exercises sit to stand holding 15# kettle bell     Other Standing Knee Exercises squat 15# kettle bell with bicep curls       Vasopneumatic   Number Minutes Vasopneumatic  10 minutes    Vasopnuematic Location  Knee  Vasopneumatic Pressure Medium    Vasopneumatic Temperature  34 deg                    PT Short Term Goals - 10/19/19 1611      PT SHORT TERM GOAL #1   Title The patient will demonstrate knowlege of basic self care and ex's to promote ROM and healing    Status Achieved      PT SHORT TERM GOAL #2   Title Left knee flexion to 100 degrees needed for greater ease in/out of the car and with sit to stand    Status Achieved      PT SHORT TERM GOAL #3   Title The patient will have decreased knee edema with patella circumferential measurement decreased to 36 cm    Status Achieved      PT SHORT TERM GOAL #4   Title The patient will be able to ambulate > 300 feet for short to medium community distances    Status Achieved      PT SHORT TERM GOAL #5   Title The patient will be able to ascend/descend steps one at a time independently    Status Achieved             PT Long Term Goals - 11/25/19 1713      PT LONG TERM  GOAL #1   Title The patient will be independent with a safe self progession of HEP    Time 8    Period Weeks    Status On-going      PT LONG TERM GOAL #2   Title The patient will have left knee ROM 0-132 degrees for greater ease ascending and descending stairs    Time 8    Period Weeks    Status On-going      PT LONG TERM GOAL #3   Title The patient will be able to ambulate >1/4 mile needed for work with pain level 2/10 or less    Status Achieved      PT LONG TERM GOAL #4   Title The patient will have 4+/5 left knee strength needed for ascending and descending stairs and walking/standing longer periods of time    Time 8    Period Weeks    Status On-going      PT LONG TERM GOAL #5   Title FOTO functional outcome score improved from 53% limitation to 30% indicating improved function with less pain    Time 8    Period Weeks    Status On-going                 Plan - 12/09/19 1141    Clinical Impression Statement The patient is able to perform more advanced strength challenges but at limited repetitions secondary to muscular fatigue.  Verbal cues needed for activation of gluteals rather than compensating with arm movements to lift kettle bell weight.  She denies pain but feels appropriate level of fatigue following treatment session.  Anticipate readiness fro discharge from PT next visit,    Examination-Activity Limitations Locomotion Level;Bathing;Carry;Stairs;Lift;Stand    Examination-Participation Restrictions Community Activity;Driving;Yard Work;Other;Shop    Rehab Potential Good    PT Frequency 2x / week    PT Duration 8 weeks    PT Treatment/Interventions ADLs/Self Care Home Management;Aquatic Therapy;Cryotherapy;Electrical Stimulation;Neuromuscular re-education;Therapeutic exercise;Therapeutic activities;Stair training;Gait training;Patient/family education;Manual techniques;Dry needling;Taping;Vasopneumatic Device    PT Next Visit Plan recheck ROM, MMT and check  remaining goals for discharge;  vasocompression; finalize HEP;  do 6 MWT    PT Home Exercise Plan Access Code: [email or web address]           Patient will benefit from skilled therapeutic intervention in order to improve the following deficits and impairments:  Abnormal gait, Decreased range of motion, Difficulty walking, Pain, Decreased activity tolerance, Impaired perceived functional ability, Increased edema, Decreased strength  Visit Diagnosis: Muscle weakness (generalized)  Stiffness of left knee, not elsewhere classified  Acute pain of left knee  Localized edema     Problem List Patient Active Problem List   Diagnosis Date Noted  . Fracture of tibial shaft, left, closed 07/05/2019  . Acute lateral meniscus tear of left knee 07/05/2019  . Tibial plateau fracture, left 07/05/2019  . Closed bicondylar fracture of left tibial plateau 07/04/2019  . Encounter for antineoplastic chemotherapy 10/27/2017  . 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 Goals of care, counseling/discussion 10/11/2017  . Encounter for smoking cessation counseling 10/11/2017  . S/P lobectomy of lung 09/30/2017   Ruben Im, PT 12/09/19 11:48 AM Phone: [phone] Fax: [phone] Alvera Singh 12/09/2019, 11:47 AM  [facility] [address] [place], [place], [address] Phone: [phone]   Fax:  [phone]  Name: Brandi Crosby MRN: [id number] Date of Birth: 1956-04-24

## 2019-12-14 ENCOUNTER — Other Ambulatory Visit: Payer: Self-pay

## 2019-12-14 ENCOUNTER — Ambulatory Visit: Payer: 59 | Admitting: Physical Therapy

## 2019-12-14 DIAGNOSIS — M6281 Muscle weakness (generalized): Secondary | ICD-10-CM

## 2019-12-14 DIAGNOSIS — M25562 Pain in left knee: Secondary | ICD-10-CM

## 2019-12-14 DIAGNOSIS — M25662 Stiffness of left knee, not elsewhere classified: Secondary | ICD-10-CM

## 2019-12-14 DIAGNOSIS — R600 Localized edema: Secondary | ICD-10-CM

## 2019-12-14 NOTE — Therapy (Signed)
[facility] [address] [place], [place], [address] Phone: [phone]   Fax:  [phone]  Physical Therapy Treatment/Discharge Summary  Patient Details  Name: Brandi Crosby MRN: [id number] Date of Birth: March 24, 1956 Referring Provider (PT): Dr. Doreatha Martin    Encounter Date: 12/14/2019   PT End of Session - 12/14/19 1351    Visit Number 30    Number of Visits 60    Date for PT Re-Evaluation 12/14/19    Authorization Type UHC    PT Start Time 2482    PT Stop Time 1225    PT Time Calculation (min) 40 min    Activity Tolerance Patient tolerated treatment well           Past Medical History:  Diagnosis Date  . Allergy   . Anxiety   . Depression   . Hyperlipemia   . Lung mass   . SCL ca dx'd 08/2017  . Shingles     Past Surgical History:  Procedure Laterality Date  . DENTAL SURGERY  2010   implant  . NODE DISSECTION Left 09/30/2017   Procedure: NODE DISSECTION;  Surgeon: Grace Isaac, MD;  Location: [place];  Service: Thoracic;  Laterality: Left;  . ORIF TIBIA PLATEAU Left 07/05/2019   Procedure: OPEN REDUCTION INTERNAL FIXATION (ORIF) TIBIAL PLATEAU;  Surgeon: Shona Needles, MD;  Location: [place];  Service: Orthopedics;  Laterality: Left;  . RIGHT OOPHORECTOMY    . TONSILLECTOMY AND ADENOIDECTOMY    . VIDEO ASSISTED THORACOSCOPY (VATS)/WEDGE RESECTION Left 09/30/2017   Procedure: VIDEO ASSISTED THORACOSCOPY (VATS)/LUNG RESECTION;  Surgeon: Grace Isaac, MD;  Location: [place];  Service: Thoracic;  Laterality: Left;  [name] VIDEO BRONCHOSCOPY N/A 09/30/2017   Procedure: VIDEO BRONCHOSCOPY;  Surgeon: Grace Isaac, MD;  Location: [facility] OR;  Service: Thoracic;  Laterality: N/A;  . WISDOM TOOTH EXTRACTION      There were no vitals filed for this visit.   Subjective Assessment - 12/14/19 1149    Subjective I'm good.  Reports some soreness after last visit but not much.  Knee just tight, not painful.      Pertinent History healthy;  goes by "Brandi Crosby";  Md 6/29    Currently in Pain? No/denies    Pain Score 0-No pain    Pain Orientation Left    Pain Type Acute pain              [facility] PT Assessment - 12/14/19 0001      Observation/Other Assessments   Focus on Therapeutic Outcomes (FOTO)  19%      AROM   Left Knee Extension 0    Left Knee Flexion 142      Strength   Overall Strength Comments Single leg stand 14 sec     Left Knee Flexion 5/5   5-/5   Left Knee Extension 5/5   5-/5     6 minute walk test results    Aerobic Endurance Distance Walked 1551                         [facility] Adult PT Treatment/Exercise - 12/14/19 0001      Knee/Hip Exercises: Stretches   Other Knee/Hip Stretches 2nd step ROM 20x       Knee/Hip Exercises: Aerobic   Recumbent Bike L3 x5 min                    PT Short  Term Goals - 12/14/19 1357      PT SHORT TERM GOAL #1   Title The patient will demonstrate knowlege of basic self care and ex's to promote ROM and healing    Status Achieved      PT SHORT TERM GOAL #2   Title Left knee flexion to 100 degrees needed for greater ease in/out of the car and with sit to stand    Status Achieved      PT SHORT TERM GOAL #3   Title The patient will have decreased knee edema with patella circumferential measurement decreased to 36 cm    Status Achieved      PT SHORT TERM GOAL #4   Title The patient will be able to ambulate > 300 feet for short to medium community distances    Status Achieved      PT SHORT TERM GOAL #5   Title The patient will be able to ascend/descend steps one at a time independently    Status Achieved             PT Long Term Goals - 12/14/19 1207      PT LONG TERM GOAL #1   Title The patient will be independent with a safe self progession of HEP    Status Achieved      PT LONG TERM GOAL #2   Title The patient will have left knee ROM 0-132 degrees for greater ease ascending and descending stairs     Status Achieved      PT LONG TERM GOAL #3   Title The patient will be able to ambulate >1/4 mile needed for work with pain level 2/10 or less    Status Achieved      PT LONG TERM GOAL #4   Title The patient will have 4+/5 left knee strength needed for ascending and descending stairs and walking/standing longer periods of time    Status Achieved      PT LONG TERM GOAL #5   Title FOTO functional outcome score improved from 53% limitation to 30% indicating improved function with less pain    Baseline 19% limitation    Status Achieved                 Plan - 12/14/19 1209    Clinical Impression Statement The patient has made excellent improvements in knee ROM with full and painless ROM 0-142 degrees.  Her knee flexion/extension and hip strength grossly 5-/5 strength.  She is able to walk > 1 mile and ascend and descend steps reciprocally without an assistive device and no limp.  With 6 minute walk test she is able to ambulate 1551 feet.  She has met all rehab goals.  Recommend discharge from PT at this time.    Examination-Activity Limitations Locomotion Level;Bathing;Carry;Stairs;Lift;Stand    Examination-Participation Restrictions Community Activity;Driving;Yard Work;Other;Shop    Rehab Potential Good    PT Frequency 2x / week    PT Duration 8 weeks    PT Treatment/Interventions ADLs/Self Care Home Management;Aquatic Therapy;Cryotherapy;Electrical Stimulation;Neuromuscular re-education;Therapeutic exercise;Therapeutic activities;Stair training;Gait training;Patient/family education;Manual techniques;Dry needling;Taping;Vasopneumatic Device           Patient will benefit from skilled therapeutic intervention in order to improve the following deficits and impairments:  Abnormal gait, Decreased range of motion, Difficulty walking, Pain, Decreased activity tolerance, Impaired perceived functional ability, Increased edema, Decreased strength  Visit Diagnosis: Muscle weakness  (generalized)  Stiffness of left knee, not elsewhere classified  Acute pain of left knee  Localized  edema    PHYSICAL THERAPY DISCHARGE SUMMARY  Visits from Start of Care: 30  Current functional level related to goals / functional outcomes: See clinical impressions above  Remaining deficits: As above   Education / Equipment: HEP Plan: Patient agrees to discharge.  Patient goals were met. Patient is being discharged due to meeting the stated rehab goals.  ?????         Problem List Patient Active Problem List   Diagnosis Date Noted  . Fracture of tibial shaft, left, closed 07/05/2019  . Acute lateral meniscus tear of left knee 07/05/2019  . Tibial plateau fracture, left 07/05/2019  . Closed bicondylar fracture of left tibial plateau 07/04/2019  . Encounter for antineoplastic chemotherapy 10/27/2017  . 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 Goals of care, counseling/discussion 10/11/2017  . Encounter for smoking cessation counseling 10/11/2017  . S/P lobectomy of lung 09/30/2017   Ruben Im, PT 12/14/19 1:59 PM Phone: [phone] Fax: [phone] Alvera Singh 12/14/2019, 1:58 PM  [facility] [address] [place], [place], [address] Phone: [phone]   Fax:  [phone]  Name: Brandi Crosby MRN: [id number] Date of Birth: 12-20-1955

## 2019-12-16 ENCOUNTER — Encounter: Payer: 59 | Admitting: Physical Therapy

## 2019-12-21 ENCOUNTER — Encounter: Payer: 59 | Admitting: Physical Therapy

## 2019-12-23 ENCOUNTER — Encounter: Payer: 59 | Admitting: Physical Therapy

## 2020-03-03 ENCOUNTER — Other Ambulatory Visit: Payer: Self-pay

## 2020-03-03 ENCOUNTER — Ambulatory Visit (HOSPITAL_COMMUNITY)
Admission: RE | Admit: 2020-03-03 | Discharge: 2020-03-03 | Disposition: A | Discharge status: Home / Self Care | Payer: 59 | Source: Ambulatory Visit | Attending: Internal Medicine | Admitting: Internal Medicine

## 2020-03-03 ENCOUNTER — Other Ambulatory Visit: Payer: 59

## 2020-03-03 ENCOUNTER — Inpatient Hospital Stay: Payer: 59 | Attending: Internal Medicine

## 2020-03-03 DIAGNOSIS — C3432 Malignant neoplasm of lower lobe, left bronchus or lung: Secondary | ICD-10-CM | POA: Insufficient documentation

## 2020-03-03 DIAGNOSIS — Z902 Acquired absence of lung [part of]: Secondary | ICD-10-CM | POA: Diagnosis not present

## 2020-03-03 DIAGNOSIS — Z85118 Personal history of other malignant neoplasm of bronchus and lung: Secondary | ICD-10-CM | POA: Diagnosis not present

## 2020-03-03 DIAGNOSIS — Z9221 Personal history of antineoplastic chemotherapy: Secondary | ICD-10-CM | POA: Diagnosis not present

## 2020-03-03 DIAGNOSIS — R911 Solitary pulmonary nodule: Secondary | ICD-10-CM | POA: Diagnosis present

## 2020-03-03 LAB — CBC WITH DIFFERENTIAL (CANCER CENTER ONLY)
Abs Immature Granulocytes: 0.01 10*3/uL (ref 0.00–0.07)
Basophils Absolute: 0 10*3/uL (ref 0.0–0.1)
Basophils Relative: 1 %
Eosinophils Absolute: 0.1 10*3/uL (ref 0.0–0.5)
Eosinophils Relative: 2 %
HCT: 41 % (ref 36.0–46.0)
Hemoglobin: 13.8 g/dL (ref 12.0–15.0)
Immature Granulocytes: 0 %
Lymphocytes Relative: 35 %
Lymphs Abs: 1.8 10*3/uL (ref 0.7–4.0)
MCH: 31.6 pg (ref 26.0–34.0)
MCHC: 33.7 g/dL (ref 30.0–36.0)
MCV: 93.8 fL (ref 80.0–100.0)
Monocytes Absolute: 0.5 10*3/uL (ref 0.1–1.0)
Monocytes Relative: 9 %
Neutro Abs: 2.8 10*3/uL (ref 1.7–7.7)
Neutrophils Relative %: 53 %
Platelet Count: 205 10*3/uL (ref 150–400)
RBC: 4.37 MIL/uL (ref 3.87–5.11)
RDW: 12.8 % (ref 11.5–15.5)
WBC Count: 5.2 10*3/uL (ref 4.0–10.5)
nRBC: 0 % (ref 0.0–0.2)

## 2020-03-03 LAB — CMP (CANCER CENTER ONLY)
ALT: 16 U/L (ref 0–44)
AST: 19 U/L (ref 15–41)
Albumin: 4.2 g/dL (ref 3.5–5.0)
Alkaline Phosphatase: 75 U/L (ref 38–126)
Anion gap: 8 (ref 5–15)
BUN: 11 mg/dL (ref 8–23)
CO2: 27 mmol/L (ref 22–32)
Calcium: 9.6 mg/dL (ref 8.9–10.3)
Chloride: 105 mmol/L (ref 98–111)
Creatinine: 0.86 mg/dL (ref 0.44–1.00)
GFR, Est AFR Am: >60 mL/min (ref >60)
GFR, Estimated: >60 mL/min (ref >60)
Glucose, Bld: 103 mg/dL — ABNORMAL HIGH (ref 70–99)
Potassium: 4.2 mmol/L (ref 3.5–5.1)
Sodium: 140 mmol/L (ref 135–145)
Total Bilirubin: 0.6 mg/dL (ref 0.3–1.2)
Total Protein: 7.2 g/dL (ref 6.5–8.1)

## 2020-03-03 MED ORDER — IOHEXOL 300 MG/ML  SOLN
75.0000 mL | Freq: Once | INTRAMUSCULAR | Status: AC | PRN
  Administered 2020-03-03: 75 mL via INTRAVENOUS

## 2020-03-06 ENCOUNTER — Encounter: Payer: Self-pay | Admitting: Internal Medicine

## 2020-03-06 ENCOUNTER — Other Ambulatory Visit: Payer: Self-pay

## 2020-03-06 ENCOUNTER — Inpatient Hospital Stay (HOSPITAL_BASED_OUTPATIENT_CLINIC_OR_DEPARTMENT_OTHER): Payer: 59 | Admitting: Internal Medicine

## 2020-03-06 VITALS — BP 119/74 | HR 75 | Temp 97.7°F | Resp 17 | Ht 65.0 in | Wt 129.1 lb

## 2020-03-06 DIAGNOSIS — C3490 Malignant neoplasm of unspecified part of unspecified bronchus or lung: Secondary | ICD-10-CM

## 2020-03-06 DIAGNOSIS — R911 Solitary pulmonary nodule: Secondary | ICD-10-CM | POA: Diagnosis not present

## 2020-03-06 DIAGNOSIS — Z902 Acquired absence of lung [part of]: Secondary | ICD-10-CM

## 2020-03-06 DIAGNOSIS — C3432 Malignant neoplasm of lower lobe, left bronchus or lung: Secondary | ICD-10-CM | POA: Diagnosis not present

## 2020-03-06 NOTE — Progress Notes (Signed)
[place] Telephone:([phone]   Fax:(336) [phone]  OFFICE PROGRESS NOTE  Carolee Rota, NP [place] [address]  DIAGNOSIS: Limited stage (T2 a, N0, M0) of small cell lung cancer presented with left lower lobe lung mass status post left lower lobectomy with lymph node dissection on September 30, 2017.  PRIOR THERAPY:  1) status post left lower lobectomy with lymph node dissection on September 30, 2017. 2) Adjuvant systemic chemotherapy with 4 cycles of cisplatin 80 mg/M2 on day 1 and etoposide 100 mg/M2 on days 1, 2 and 3 every 3 weeks.  First dose Nov 03, 2017.  Status post 4 cycles.  Last dose was given January 12, 2018.  CURRENT THERAPY: Observation.  INTERVAL HISTORY: Brandi Crosby 64 y.o. female returns to the clinic today for follow-up visit accompanied by her husband.  The patient is feeling fine today with no concerning complaints.  She denied having any chest pain, shortness of breath, cough or hemoptysis.  She denied having any fever or chills.  She has no nausea, vomiting, diarrhea or constipation.  She has no headache or visual changes.  She had repeat CT scan of the chest performed recently and she is here for evaluation and discussion of her risk her results.   MEDICAL HISTORY: Past Medical History:  Diagnosis Date  . Allergy   . Anxiety   . Depression   . Hyperlipemia   . Lung mass   . SCL ca dx'd 08/2017  . Shingles     ALLERGIES:  is allergic to augmentin [amoxicillin-pot clavulanate] and penicillins.  MEDICATIONS:  Current Outpatient Medications  Medication Sig Dispense Refill  . ALPRAZolam (XANAX) 0.5 MG tablet Take 0.5 mg by mouth at bedtime as needed for anxiety or sleep.     . Ascorbic Acid (VITAMIN C) 1000 MG tablet Take 1,000 mg by mouth daily.      . cholecalciferol (VITAMIN D) 1000 units tablet Take 1,000 Units by mouth daily.    [name] escitalopram (LEXAPRO) 10 MG tablet Take 10 mg by mouth daily.      No current  facility-administered medications for this visit.    SURGICAL HISTORY:  Past Surgical History:  Procedure Laterality Date  . DENTAL SURGERY  2010   implant  . NODE DISSECTION Left 09/30/2017   Procedure: NODE DISSECTION;  Surgeon: Grace Isaac, MD;  Location: [employer] [facility];  Service: Thoracic;  Laterality: Left;  . ORIF TIBIA PLATEAU Left 07/05/2019   Procedure: OPEN REDUCTION INTERNAL FIXATION (ORIF) TIBIAL PLATEAU;  Surgeon: Shona Needles, MD;  Location: [facility];  Service: Orthopedics;  Laterality: Left;  . RIGHT OOPHORECTOMY    . TONSILLECTOMY AND ADENOIDECTOMY    . VIDEO ASSISTED THORACOSCOPY (VATS)/WEDGE RESECTION Left 09/30/2017   Procedure: VIDEO ASSISTED THORACOSCOPY (VATS)/LUNG RESECTION;  Surgeon: Grace Isaac, MD;  Location: [facility];  Service: Thoracic;  Laterality: Left;  [name] VIDEO BRONCHOSCOPY N/A 09/30/2017   Procedure: VIDEO BRONCHOSCOPY;  Surgeon: Grace Isaac, MD;  Location: [facility];  Service: Thoracic;  Laterality: N/A;  . WISDOM TOOTH EXTRACTION      REVIEW OF SYSTEMS:  A comprehensive review of systems was negative.   PHYSICAL EXAMINATION: General appearance: alert, cooperative and no distress Head: Normocephalic, without obvious abnormality, atraumatic Neck: no adenopathy, no JVD, supple, symmetrical, trachea midline and thyroid not enlarged, symmetric, no tenderness/mass/nodules Lymph nodes: Cervical, supraclavicular, and axillary nodes normal. Resp: clear to auscultation bilaterally Back: symmetric, no curvature. ROM normal.  No CVA tenderness. Cardio: regular rate and rhythm, S1, S2 normal, no murmur, click, rub or gallop GI: soft, non-tender; bowel sounds normal; no masses,  no organomegaly Extremities: extremities normal, atraumatic, no cyanosis or edema  ECOG PERFORMANCE STATUS: 0 - Asymptomatic  Blood pressure 119/74, pulse 75, temperature 97.7 F (36.5 C), resp. rate 17, height 5\' 5"  (1.651 m), weight 129 lb 1.6 oz (58.6 kg), SpO2 100 %.  LABORATORY  DATA: Lab Results  Component Value Date   WBC 5.2 03/03/2020   HGB 13.8 03/03/2020   HCT 41.0 03/03/2020   MCV 93.8 03/03/2020   PLT 205 03/03/2020      Chemistry      Component Value Date/Time   NA 140 03/03/2020 0926   K 4.2 03/03/2020 0926   CL 105 03/03/2020 0926   CO2 27 03/03/2020 0926   BUN 11 03/03/2020 0926   CREATININE 0.86 03/03/2020 0926      Component Value Date/Time   CALCIUM 9.6 03/03/2020 0926   ALKPHOS 75 03/03/2020 0926   AST 19 03/03/2020 0926   ALT 16 03/03/2020 0926   BILITOT 0.6 03/03/2020 0926       RADIOGRAPHIC STUDIES: CT Chest W Contrast  Result Date: 03/03/2020 CLINICAL DATA:  Primary Cancer Type: Lung Imaging Indication: Routine surveillance Interval therapy since last imaging? No Initial Cancer Diagnosis Date: 09/30/2017; Established by: Biopsy-proven Detailed Pathology: Limited stage small cell lung cancer. Primary Tumor location: Left lower lobe. Surgeries: Left lower lobectomy 09/30/2017. Chemotherapy: Yes; Ongoing? No; Most recent administration: 01/12/2018 Immunotherapy? No Radiation therapy? No EXAM: CT CHEST WITH CONTRAST TECHNIQUE: Multidetector CT imaging of the chest was performed during intravenous contrast administration. CONTRAST:  37mL OMNIPAQUE IOHEXOL 300 MG/ML  SOLN COMPARISON:  Most recent CT chest 08/26/2019.  09/18/2017 PET-CT. FINDINGS: Cardiovascular: The heart size is normal. No substantial pericardial effusion. Coronary artery calcification is evident. Atherosclerotic calcification is noted in the wall of the thoracic aorta. Mediastinum/Nodes: No mediastinal lymphadenopathy. There is no hilar lymphadenopathy. The esophagus has normal imaging features. There is no axillary lymphadenopathy. Lungs/Pleura: Volume loss left hemithorax compatible with previous left lower lobectomy. Stable calcified granuloma right upper lobe (51/7). 5 mm subpleural nodule in the posterior right costophrenic sulcus is new in the interval (142/7) with  adjacent 4 mm nodule seen on 140/7. No focal airspace consolidation. No pleural effusion. Upper Abdomen: Unremarkable. Musculoskeletal: No worrisome lytic or sclerotic osseous abnormality. IMPRESSION: 1. No definite findings for recurrent or metastatic disease. 2. 2 tiny subpleural nodules posterior right costophrenic sulcus are new in the interval and likely infectious/inflammatory or related to atelectasis. Consider 3 month follow-up to assess for resolution. 3.  Aortic Atherosclerois (ICD10-170.0) Electronically Signed   By: Misty Stanley M.D.   On: 03/03/2020 10:40    ASSESSMENT AND PLAN: This is a very pleasant 64 years old white female recently diagnosed with limited stage small cell lung cancer status post surgical resection.   She completed a course of adjuvant treatment with systemic chemotherapy with cisplatin and etoposide status post 4 cycles.   She is currently on observation with no concerning complaints. She had repeat CT scan of the chest performed recently.  I personally and independently reviewed the scan images and discussed the result and showed the images to the patient and her husband. Her scan showed no concerning findings for disease recurrence or metastatic disease but there was tiny subpleural nodules in the posterior right costophrenic sulcus that is new in the interval and suspicious to be inflammatory in  origin. I recommended for the patient to have repeat CT scan of the chest in 3 months for reevaluation of her disease and to rule out any progression of the subpleural nodules. She was advised to call immediately if she has any other concerning symptoms in the interval. The patient voices understanding of current disease status and treatment options and is in agreement with the current care plan.  All questions were answered. The patient knows to call the clinic with any problems, questions or concerns. We can certainly see the patient much sooner if necessary.  Disclaimer:  This note was dictated with voice recognition software. Similar sounding words can inadvertently be transcribed and may not be corrected upon review.

## 2020-03-08 ENCOUNTER — Telehealth: Payer: Self-pay | Admitting: Internal Medicine

## 2020-03-08 NOTE — Telephone Encounter (Signed)
Scheduled per los. Called and spoke with patient. Confirmed appt 

## 2020-06-02 ENCOUNTER — Inpatient Hospital Stay: Payer: 59 | Attending: Internal Medicine

## 2020-06-02 ENCOUNTER — Encounter (HOSPITAL_COMMUNITY): Payer: Self-pay

## 2020-06-02 ENCOUNTER — Other Ambulatory Visit: Payer: Self-pay

## 2020-06-02 ENCOUNTER — Ambulatory Visit (HOSPITAL_COMMUNITY)
Admission: RE | Admit: 2020-06-02 | Discharge: 2020-06-02 | Disposition: A | Discharge status: Home / Self Care | Payer: 59 | Source: Ambulatory Visit | Attending: Internal Medicine | Admitting: Internal Medicine

## 2020-06-02 DIAGNOSIS — C3432 Malignant neoplasm of lower lobe, left bronchus or lung: Secondary | ICD-10-CM | POA: Diagnosis not present

## 2020-06-02 DIAGNOSIS — C3490 Malignant neoplasm of unspecified part of unspecified bronchus or lung: Secondary | ICD-10-CM

## 2020-06-02 DIAGNOSIS — Z9221 Personal history of antineoplastic chemotherapy: Secondary | ICD-10-CM | POA: Diagnosis not present

## 2020-06-02 LAB — CMP (CANCER CENTER ONLY)
ALT: 18 U/L (ref 0–44)
AST: 20 U/L (ref 15–41)
Albumin: 4.3 g/dL (ref 3.5–5.0)
Alkaline Phosphatase: 70 U/L (ref 38–126)
Anion gap: 9 (ref 5–15)
BUN: 11 mg/dL (ref 8–23)
CO2: 26 mmol/L (ref 22–32)
Calcium: 10 mg/dL (ref 8.9–10.3)
Chloride: 104 mmol/L (ref 98–111)
Creatinine: 0.93 mg/dL (ref 0.44–1.00)
GFR, Estimated: >60 mL/min (ref >60)
Glucose, Bld: 95 mg/dL (ref 70–99)
Potassium: 4.1 mmol/L (ref 3.5–5.1)
Sodium: 139 mmol/L (ref 135–145)
Total Bilirubin: 0.5 mg/dL (ref 0.3–1.2)
Total Protein: 7.4 g/dL (ref 6.5–8.1)

## 2020-06-02 LAB — CBC WITH DIFFERENTIAL (CANCER CENTER ONLY)
Abs Immature Granulocytes: 0.02 10*3/uL (ref 0.00–0.07)
Basophils Absolute: 0 10*3/uL (ref 0.0–0.1)
Basophils Relative: 1 %
Eosinophils Absolute: 0.1 10*3/uL (ref 0.0–0.5)
Eosinophils Relative: 2 %
HCT: 41 % (ref 36.0–46.0)
Hemoglobin: 14.1 g/dL (ref 12.0–15.0)
Immature Granulocytes: 0 %
Lymphocytes Relative: 33 %
Lymphs Abs: 2.3 10*3/uL (ref 0.7–4.0)
MCH: 31.9 pg (ref 26.0–34.0)
MCHC: 34.4 g/dL (ref 30.0–36.0)
MCV: 92.8 fL (ref 80.0–100.0)
Monocytes Absolute: 0.5 10*3/uL (ref 0.1–1.0)
Monocytes Relative: 7 %
Neutro Abs: 4.1 10*3/uL (ref 1.7–7.7)
Neutrophils Relative %: 57 %
Platelet Count: 214 10*3/uL (ref 150–400)
RBC: 4.42 MIL/uL (ref 3.87–5.11)
RDW: 12.4 % (ref 11.5–15.5)
WBC Count: 7.1 10*3/uL (ref 4.0–10.5)
nRBC: 0 % (ref 0.0–0.2)

## 2020-06-02 MED ORDER — IOHEXOL 300 MG/ML  SOLN
75.0000 mL | Freq: Once | INTRAMUSCULAR | Status: AC | PRN
  Administered 2020-06-02: 75 mL via INTRAVENOUS

## 2020-06-02 MED ORDER — SODIUM CHLORIDE (PF) 0.9 % IJ SOLN
INTRAMUSCULAR | Status: AC
  Filled 2020-06-02: qty 50

## 2020-06-05 ENCOUNTER — Encounter: Payer: Self-pay | Admitting: Internal Medicine

## 2020-06-05 ENCOUNTER — Inpatient Hospital Stay: Payer: 59 | Admitting: Internal Medicine

## 2020-06-05 ENCOUNTER — Other Ambulatory Visit: Payer: Self-pay

## 2020-06-05 VITALS — BP 119/75 | HR 73 | Temp 98.7°F | Resp 18 | Ht 65.0 in | Wt 131.1 lb

## 2020-06-05 DIAGNOSIS — C3432 Malignant neoplasm of lower lobe, left bronchus or lung: Secondary | ICD-10-CM

## 2020-06-05 DIAGNOSIS — C3490 Malignant neoplasm of unspecified part of unspecified bronchus or lung: Secondary | ICD-10-CM

## 2020-06-05 NOTE — Progress Notes (Signed)
[place] Telephone:(336) [phone]   Fax:(336) [phone]  OFFICE PROGRESS NOTE  Carolee Rota, NP [place] [address]  DIAGNOSIS: Limited stage (T2 a, N0, M0) of small cell lung cancer presented with left lower lobe lung mass status post left lower lobectomy with lymph node dissection on September 30, 2017.  PRIOR THERAPY:  1) status post left lower lobectomy with lymph node dissection on September 30, 2017. 2) Adjuvant systemic chemotherapy with 4 cycles of cisplatin 80 mg/M2 on day 1 and etoposide 100 mg/M2 on days 1, 2 and 3 every 3 weeks.  First dose Nov 03, 2017.  Status post 4 cycles.  Last dose was given January 12, 2018.  CURRENT THERAPY: Observation.  INTERVAL HISTORY: Brandi Crosby 64 y.o. female returns to the clinic today for follow-up visit accompanied by her husband.  The patient is feeling fine today with no concerning complaints.  She denied having any chest pain, shortness of breath, cough or hemoptysis.  She denied having any nausea, vomiting, diarrhea or constipation.  She denied having any headache or visual changes.  She has no recent weight loss or night sweats.  She had repeat CT scan of the chest performed recently and she is here for evaluation and discussion of her scan results.  MEDICAL HISTORY: Past Medical History:  Diagnosis Date   Allergy    Anxiety    Depression    Hyperlipemia    Lung mass    SCL ca dx'd 08/2017   Shingles     ALLERGIES:  is allergic to augmentin [amoxicillin-pot clavulanate] and penicillins.  MEDICATIONS:  Current Outpatient Medications  Medication Sig Dispense Refill   ALPRAZolam (XANAX) 0.5 MG tablet Take 0.5 mg by mouth at bedtime as needed for anxiety or sleep.      Ascorbic Acid (VITAMIN C) 1000 MG tablet Take 1,000 mg by mouth daily.       cholecalciferol (VITAMIN D) 1000 units tablet Take 1,000 Units by mouth daily.     escitalopram (LEXAPRO) 10 MG tablet Take 10 mg by mouth daily.       No current facility-administered medications for this visit.    SURGICAL HISTORY:  Past Surgical History:  Procedure Laterality Date   DENTAL SURGERY  2010   implant   NODE DISSECTION Left 09/30/2017   Procedure: NODE DISSECTION;  Surgeon: Grace Isaac, MD;  Location: [facility];  Service: Thoracic;  Laterality: Left;   ORIF TIBIA PLATEAU Left 07/05/2019   Procedure: OPEN REDUCTION INTERNAL FIXATION (ORIF) TIBIAL PLATEAU;  Surgeon: Shona Needles, MD;  Location: [facility];  Service: Orthopedics;  Laterality: Left;   RIGHT OOPHORECTOMY     TONSILLECTOMY AND ADENOIDECTOMY     VIDEO ASSISTED THORACOSCOPY (VATS)/WEDGE RESECTION Left 09/30/2017   Procedure: VIDEO ASSISTED THORACOSCOPY (VATS)/LUNG RESECTION;  Surgeon: Grace Isaac, MD;  Location: [facility];  Service: Thoracic;  Laterality: Left;   VIDEO BRONCHOSCOPY N/A 09/30/2017   Procedure: VIDEO BRONCHOSCOPY;  Surgeon: Grace Isaac, MD;  Location: [facility];  Service: Thoracic;  Laterality: N/A;   WISDOM TOOTH EXTRACTION      REVIEW OF SYSTEMS:  A comprehensive review of systems was negative.   PHYSICAL EXAMINATION: General appearance: alert, cooperative and no distress Head: Normocephalic, without obvious abnormality, atraumatic Neck: no adenopathy, no JVD, supple, symmetrical, trachea midline and thyroid not enlarged, symmetric, no tenderness/mass/nodules Lymph nodes: Cervical, supraclavicular, and axillary nodes normal. Resp: clear to auscultation bilaterally Back: symmetric, no curvature.  ROM normal. No CVA tenderness. Cardio: regular rate and rhythm, S1, S2 normal, no murmur, click, rub or gallop GI: soft, non-tender; bowel sounds normal; no masses,  no organomegaly Extremities: extremities normal, atraumatic, no cyanosis or edema  ECOG PERFORMANCE STATUS: 0 - Asymptomatic  Blood pressure 119/75, pulse 73, temperature 98.7 F (37.1 C), temperature source Tympanic, resp. rate 18, height 5\' 5"  (1.651 m), weight 131 lb 1.6  oz (59.5 kg), SpO2 100 %.  LABORATORY DATA: Lab Results  Component Value Date   WBC 7.1 06/02/2020   HGB 14.1 06/02/2020   HCT 41.0 06/02/2020   MCV 92.8 06/02/2020   PLT 214 06/02/2020      Chemistry      Component Value Date/Time   NA 139 06/02/2020 1333   K 4.1 06/02/2020 1333   CL 104 06/02/2020 1333   CO2 26 06/02/2020 1333   BUN 11 06/02/2020 1333   CREATININE 0.93 06/02/2020 1333      Component Value Date/Time   CALCIUM 10.0 06/02/2020 1333   ALKPHOS 70 06/02/2020 1333   AST 20 06/02/2020 1333   ALT 18 06/02/2020 1333   BILITOT 0.5 06/02/2020 1333       RADIOGRAPHIC STUDIES: CT Chest W Contrast  Result Date: 06/04/2020 CLINICAL DATA:  Limited stage small cell left lower lobe lung cancer status post left lower lobectomy 09/30/2017 with adjuvant chemotherapy. Restaging. EXAM: CT CHEST WITH CONTRAST TECHNIQUE: Multidetector CT imaging of the chest was performed during intravenous contrast administration. CONTRAST:  62mL OMNIPAQUE IOHEXOL 300 MG/ML  SOLN COMPARISON:  03/03/2020 chest CT. FINDINGS: Cardiovascular: Normal heart size. No significant pericardial effusion/thickening. Atherosclerotic nonaneurysmal thoracic aorta. Normal caliber pulmonary arteries. No central pulmonary emboli. Mediastinum/Nodes: No discrete thyroid nodules. Unremarkable esophagus. No pathologically enlarged axillary, mediastinal or hilar lymph nodes. Lungs/Pleura: No pneumothorax. No pleural effusion. Status post left lower lobectomy. Stable tiny calcified peripheral right upper lobe granuloma. No acute consolidative airspace disease, lung masses or significant pulmonary nodules. Previously described subpleural tiny 5 mm and 4 mm basilar right lower lobe pulmonary nodules have resolved. Upper abdomen: No acute abnormality. Musculoskeletal: No aggressive appearing focal osseous lesions. Moderate thoracic spondylosis. IMPRESSION: 1. No evidence of local tumor recurrence status post left lower lobectomy.  2. No evidence of metastatic disease in the chest. Previously described tiny right lung base pulmonary nodules have resolved. 3. Aortic Atherosclerosis (ICD10-I70.0). Electronically Signed   By: Ilona Sorrel M.D.   On: 06/04/2020 14:43    ASSESSMENT AND PLAN: This is a very pleasant 64 years old white female recently diagnosed with limited stage small cell lung cancer status post surgical resection.   She completed a course of adjuvant treatment with systemic chemotherapy with cisplatin and etoposide status post 4 cycles.   The patient is currently on observation and she is feeling fine today with no concerning complaints. She had repeat CT scan of the chest performed recently.  I personally and independently reviewed the scans and discussed the results with the patient and her husband today. Her scan showed no concerning findings for disease recurrence or metastasis. I recommended for her to continue on observation with repeat CT scan of the chest in 6 months. The patient was advised to call immediately if she has any concerning symptoms in the interval. The patient voices understanding of current disease status and treatment options and is in agreement with the current care plan.  All questions were answered. The patient knows to call the clinic with any problems, questions or concerns. We  can certainly see the patient much sooner if necessary.  Disclaimer: This note was dictated with voice recognition software. Similar sounding words can inadvertently be transcribed and may not be corrected upon review.

## 2020-06-06 ENCOUNTER — Telehealth: Payer: Self-pay | Admitting: Internal Medicine

## 2020-06-06 NOTE — Telephone Encounter (Signed)
Scheduled per los. Called and spoke with patient. Confirmed appts  

## 2020-11-26 LAB — COLOGUARD: COLOGUARD: NEGATIVE

## 2020-11-26 LAB — EXTERNAL GENERIC LAB PROCEDURE: COLOGUARD: NEGATIVE

## 2020-12-01 ENCOUNTER — Inpatient Hospital Stay: Payer: 59 | Attending: Internal Medicine

## 2020-12-01 ENCOUNTER — Other Ambulatory Visit: Payer: Self-pay

## 2020-12-01 ENCOUNTER — Ambulatory Visit (HOSPITAL_COMMUNITY)
Admission: RE | Admit: 2020-12-01 | Discharge: 2020-12-01 | Disposition: A | Discharge status: Home / Self Care | Payer: 59 | Source: Ambulatory Visit | Attending: Internal Medicine | Admitting: Internal Medicine

## 2020-12-01 DIAGNOSIS — C3432 Malignant neoplasm of lower lobe, left bronchus or lung: Secondary | ICD-10-CM | POA: Diagnosis not present

## 2020-12-01 DIAGNOSIS — C3490 Malignant neoplasm of unspecified part of unspecified bronchus or lung: Secondary | ICD-10-CM | POA: Insufficient documentation

## 2020-12-01 DIAGNOSIS — Z87891 Personal history of nicotine dependence: Secondary | ICD-10-CM | POA: Insufficient documentation

## 2020-12-01 LAB — CMP (CANCER CENTER ONLY)
ALT: 19 U/L (ref 0–44)
AST: 21 U/L (ref 15–41)
Albumin: 4.4 g/dL (ref 3.5–5.0)
Alkaline Phosphatase: 72 U/L (ref 38–126)
Anion gap: 12 (ref 5–15)
BUN: 12 mg/dL (ref 8–23)
CO2: 25 mmol/L (ref 22–32)
Calcium: 10 mg/dL (ref 8.9–10.3)
Chloride: 102 mmol/L (ref 98–111)
Creatinine: 0.88 mg/dL (ref 0.44–1.00)
GFR, Estimated: >60 mL/min (ref >60)
Glucose, Bld: 109 mg/dL — ABNORMAL HIGH (ref 70–99)
Potassium: 4.3 mmol/L (ref 3.5–5.1)
Sodium: 139 mmol/L (ref 135–145)
Total Bilirubin: 0.5 mg/dL (ref 0.3–1.2)
Total Protein: 7.5 g/dL (ref 6.5–8.1)

## 2020-12-01 LAB — CBC WITH DIFFERENTIAL (CANCER CENTER ONLY)
Abs Immature Granulocytes: 0.01 10*3/uL (ref 0.00–0.07)
Basophils Absolute: 0 10*3/uL (ref 0.0–0.1)
Basophils Relative: 0 %
Eosinophils Absolute: 0.1 10*3/uL (ref 0.0–0.5)
Eosinophils Relative: 1 %
HCT: 41.8 % (ref 36.0–46.0)
Hemoglobin: 14.7 g/dL (ref 12.0–15.0)
Immature Granulocytes: 0 %
Lymphocytes Relative: 23 %
Lymphs Abs: 1.6 10*3/uL (ref 0.7–4.0)
MCH: 32.6 pg (ref 26.0–34.0)
MCHC: 35.2 g/dL (ref 30.0–36.0)
MCV: 92.7 fL (ref 80.0–100.0)
Monocytes Absolute: 0.5 10*3/uL (ref 0.1–1.0)
Monocytes Relative: 8 %
Neutro Abs: 4.8 10*3/uL (ref 1.7–7.7)
Neutrophils Relative %: 68 %
Platelet Count: 221 10*3/uL (ref 150–400)
RBC: 4.51 MIL/uL (ref 3.87–5.11)
RDW: 12 % (ref 11.5–15.5)
WBC Count: 7 10*3/uL (ref 4.0–10.5)
nRBC: 0 % (ref 0.0–0.2)

## 2020-12-04 ENCOUNTER — Inpatient Hospital Stay: Payer: 59 | Admitting: Internal Medicine

## 2020-12-04 ENCOUNTER — Other Ambulatory Visit: Payer: Self-pay

## 2020-12-04 VITALS — BP 131/88 | HR 77 | Temp 97.3°F | Resp 18 | Ht 65.0 in | Wt 124.6 lb

## 2020-12-04 DIAGNOSIS — C3432 Malignant neoplasm of lower lobe, left bronchus or lung: Secondary | ICD-10-CM | POA: Diagnosis not present

## 2020-12-04 DIAGNOSIS — C3490 Malignant neoplasm of unspecified part of unspecified bronchus or lung: Secondary | ICD-10-CM | POA: Diagnosis not present

## 2020-12-04 NOTE — Progress Notes (Signed)
[place] Telephone:(336) [phone]   Fax:(336) [phone]  OFFICE PROGRESS NOTE  Carolee Rota, NP [place] [address]  DIAGNOSIS: Limited stage (T2 a, N0, M0) of small cell lung cancer presented with left lower lobe lung mass status post left lower lobectomy with lymph node dissection on September 30, 2017.  PRIOR THERAPY:  1) status post left lower lobectomy with lymph node dissection on September 30, 2017. 2) Adjuvant systemic chemotherapy with 4 cycles of cisplatin 80 mg/M2 on day 1 and etoposide 100 mg/M2 on days 1, 2 and 3 every 3 weeks.  First dose Nov 03, 2017.  Status post 4 cycles.  Last dose was given January 12, 2018.  CURRENT THERAPY: Observation.  INTERVAL HISTORY: Brandi Crosby 65 y.o. female returns to the clinic today for follow-up visit accompanied by her husband.  The patient is feeling fine today with no concerning complaints.  She denied having any chest pain, shortness of breath, cough or hemoptysis.  She denied having any fever or chills.  She has no nausea, vomiting, diarrhea or constipation.  She denied having any headache or visual changes.  She has no weight loss or night sweats.  She is here today for evaluation with repeat CT scan of the chest for restaging of her disease.   MEDICAL HISTORY: Past Medical History:  Diagnosis Date   Allergy    Anxiety    Depression    Hyperlipemia    Lung mass    SCL ca dx'd 08/2017   Shingles     ALLERGIES:  is allergic to augmentin [amoxicillin-pot clavulanate] and penicillins.  MEDICATIONS:  Current Outpatient Medications  Medication Sig Dispense Refill   ALPRAZolam (XANAX) 0.5 MG tablet Take 0.5 mg by mouth at bedtime as needed for anxiety or sleep.      Ascorbic Acid (VITAMIN C) 1000 MG tablet Take 1,000 mg by mouth daily.       cholecalciferol (VITAMIN D) 1000 units tablet Take 1,000 Units by mouth daily.     escitalopram (LEXAPRO) 10 MG tablet Take 10 mg by mouth daily.      No  current facility-administered medications for this visit.    SURGICAL HISTORY:  Past Surgical History:  Procedure Laterality Date   DENTAL SURGERY  2010   implant   NODE DISSECTION Left 09/30/2017   Procedure: NODE DISSECTION;  Surgeon: Grace Isaac, MD;  Location: [place];  Service: Thoracic;  Laterality: Left;   ORIF TIBIA PLATEAU Left 07/05/2019   Procedure: OPEN REDUCTION INTERNAL FIXATION (ORIF) TIBIAL PLATEAU;  Surgeon: Shona Needles, MD;  Location: [facility];  Service: Orthopedics;  Laterality: Left;   RIGHT OOPHORECTOMY     TONSILLECTOMY AND ADENOIDECTOMY     VIDEO ASSISTED THORACOSCOPY (VATS)/WEDGE RESECTION Left 09/30/2017   Procedure: VIDEO ASSISTED THORACOSCOPY (VATS)/LUNG RESECTION;  Surgeon: Grace Isaac, MD;  Location: [facility];  Service: Thoracic;  Laterality: Left;   VIDEO BRONCHOSCOPY N/A 09/30/2017   Procedure: VIDEO BRONCHOSCOPY;  Surgeon: Grace Isaac, MD;  Location: [facility];  Service: Thoracic;  Laterality: N/A;   WISDOM TOOTH EXTRACTION      REVIEW OF SYSTEMS:  A comprehensive review of systems was negative.   PHYSICAL EXAMINATION: General appearance: alert, cooperative, and no distress Head: Normocephalic, without obvious abnormality, atraumatic Neck: no adenopathy, no JVD, supple, symmetrical, trachea midline, and thyroid not enlarged, symmetric, no tenderness/mass/nodules Lymph nodes: Cervical, supraclavicular, and axillary nodes normal. Resp: clear to auscultation bilaterally Back:  symmetric, no curvature. ROM normal. No CVA tenderness. Cardio: regular rate and rhythm, S1, S2 normal, no murmur, click, rub or gallop GI: soft, non-tender; bowel sounds normal; no masses,  no organomegaly Extremities: extremities normal, atraumatic, no cyanosis or edema  ECOG PERFORMANCE STATUS: 0 - Asymptomatic  Blood pressure 131/88, pulse 77, temperature (!) 97.3 F (36.3 C), temperature source Tympanic, resp. rate 18, height 5\' 5"  (1.651 m), weight 124 lb 9.6 oz (56.5  kg), SpO2 98 %.  LABORATORY DATA: Lab Results  Component Value Date   WBC 7.0 12/01/2020   HGB 14.7 12/01/2020   HCT 41.8 12/01/2020   MCV 92.7 12/01/2020   PLT 221 12/01/2020      Chemistry      Component Value Date/Time   NA 139 12/01/2020 1035   K 4.3 12/01/2020 1035   CL 102 12/01/2020 1035   CO2 25 12/01/2020 1035   BUN 12 12/01/2020 1035   CREATININE 0.88 12/01/2020 1035      Component Value Date/Time   CALCIUM 10.0 12/01/2020 1035   ALKPHOS 72 12/01/2020 1035   AST 21 12/01/2020 1035   ALT 19 12/01/2020 1035   BILITOT 0.5 12/01/2020 1035       RADIOGRAPHIC STUDIES: CT CHEST WO CONTRAST  Result Date: 12/03/2020 CLINICAL DATA:  Limited stage left lower lobe small cell lung cancer status post left lower lobectomy 09/30/2017 with adjuvant chemotherapy. Restaging. Interval observation. EXAM: CT CHEST WITHOUT CONTRAST TECHNIQUE: Multidetector CT imaging of the chest was performed following the standard protocol without IV contrast. COMPARISON:  06/02/2020 chest CT. FINDINGS: Cardiovascular: Normal heart size. No significant pericardial effusion/thickening. Left anterior descending coronary atherosclerosis. Atherosclerotic nonaneurysmal thoracic aorta. Normal caliber pulmonary arteries. Mediastinum/Nodes: No discrete thyroid nodules. Unremarkable esophagus. No pathologically enlarged axillary, mediastinal or hilar lymph nodes, noting limited sensitivity for the detection of hilar adenopathy on this noncontrast study. Lungs/Pleura: No pneumothorax. No pleural effusion. Status post left lower lobectomy. No acute consolidative airspace disease, lung masses or significant pulmonary nodules. Stable tiny calcified peripheral right upper lobe granuloma. Upper abdomen: No acute abnormality. Musculoskeletal: No aggressive appearing focal osseous lesions. Mild to moderate thoracic spondylosis. IMPRESSION: 1. No evidence of local tumor recurrence status post left lower lobectomy. 2. No  evidence of metastatic disease in the chest. 3. One vessel coronary atherosclerosis. 4. Aortic Atherosclerosis (ICD10-I70.0). Electronically Signed   By: Ilona Sorrel M.D.   On: 12/03/2020 15:31     ASSESSMENT AND PLAN: This is a very pleasant 65 years old white female recently diagnosed with limited stage small cell lung cancer status post surgical resection.   She completed a course of adjuvant treatment with systemic chemotherapy with cisplatin and etoposide status post 4 cycles.   The patient is currently on observation and she is feeling fine today with no concerning complaints. She had repeat CT scan of the chest performed recently.  I personally and independently reviewed the scans and discussed the results with the patient and her husband. Her scan showed no concerning findings for disease recurrence or metastasis. I recommended for her to come back for follow-up visit in 1 year for evaluation with repeat CT scan of the chest. The patient was advised to call immediately if she has any concerning symptoms in the interval. The patient voices understanding of current disease status and treatment options and is in agreement with the current care plan.  All questions were answered. The patient knows to call the clinic with any problems, questions or concerns. We can certainly  see the patient much sooner if necessary.  Disclaimer: This note was dictated with voice recognition software. Similar sounding words can inadvertently be transcribed and may not be corrected upon review.

## 2020-12-07 ENCOUNTER — Telehealth: Payer: Self-pay | Admitting: Internal Medicine

## 2020-12-07 NOTE — Telephone Encounter (Signed)
Scheduled per los. Called and left msg. Mailed printout  °

## 2020-12-27 ENCOUNTER — Other Ambulatory Visit: Payer: Self-pay | Admitting: Family Medicine

## 2020-12-27 DIAGNOSIS — Z1231 Encounter for screening mammogram for malignant neoplasm of breast: Secondary | ICD-10-CM

## 2021-02-19 ENCOUNTER — Ambulatory Visit
Admission: RE | Admit: 2021-02-19 | Discharge: 2021-02-19 | Disposition: A | Discharge status: Home / Self Care | Payer: 59 | Source: Ambulatory Visit | Attending: Family Medicine | Admitting: Family Medicine

## 2021-02-19 ENCOUNTER — Other Ambulatory Visit: Payer: Self-pay

## 2021-02-19 DIAGNOSIS — Z1231 Encounter for screening mammogram for malignant neoplasm of breast: Secondary | ICD-10-CM

## 2021-11-12 ENCOUNTER — Other Ambulatory Visit: Payer: Self-pay | Admitting: Family Medicine

## 2021-11-12 DIAGNOSIS — M810 Age-related osteoporosis without current pathological fracture: Secondary | ICD-10-CM

## 2021-11-28 ENCOUNTER — Other Ambulatory Visit: Payer: Self-pay | Admitting: Family Medicine

## 2021-11-28 DIAGNOSIS — Z1231 Encounter for screening mammogram for malignant neoplasm of breast: Secondary | ICD-10-CM

## 2021-11-30 ENCOUNTER — Other Ambulatory Visit: Payer: Self-pay

## 2021-11-30 ENCOUNTER — Inpatient Hospital Stay: Payer: 59 | Attending: Oncology

## 2021-11-30 ENCOUNTER — Ambulatory Visit (HOSPITAL_COMMUNITY)
Admission: RE | Admit: 2021-11-30 | Discharge: 2021-11-30 | Disposition: A | Discharge status: Home / Self Care | Payer: 59 | Source: Ambulatory Visit | Attending: Internal Medicine | Admitting: Internal Medicine

## 2021-11-30 DIAGNOSIS — Z902 Acquired absence of lung [part of]: Secondary | ICD-10-CM | POA: Insufficient documentation

## 2021-11-30 DIAGNOSIS — Z9221 Personal history of antineoplastic chemotherapy: Secondary | ICD-10-CM | POA: Insufficient documentation

## 2021-11-30 DIAGNOSIS — C3432 Malignant neoplasm of lower lobe, left bronchus or lung: Secondary | ICD-10-CM | POA: Insufficient documentation

## 2021-11-30 DIAGNOSIS — C3490 Malignant neoplasm of unspecified part of unspecified bronchus or lung: Secondary | ICD-10-CM | POA: Diagnosis not present

## 2021-11-30 DIAGNOSIS — R739 Hyperglycemia, unspecified: Secondary | ICD-10-CM | POA: Insufficient documentation

## 2021-11-30 LAB — CBC WITH DIFFERENTIAL (CANCER CENTER ONLY)
Abs Immature Granulocytes: 0.02 10*3/uL (ref 0.00–0.07)
Basophils Absolute: 0 10*3/uL (ref 0.0–0.1)
Basophils Relative: 1 %
Eosinophils Absolute: 0.1 10*3/uL (ref 0.0–0.5)
Eosinophils Relative: 2 %
HCT: 40.2 % (ref 36.0–46.0)
Hemoglobin: 14.1 g/dL (ref 12.0–15.0)
Immature Granulocytes: 0 %
Lymphocytes Relative: 37 %
Lymphs Abs: 2.3 10*3/uL (ref 0.7–4.0)
MCH: 32.9 pg (ref 26.0–34.0)
MCHC: 35.1 g/dL (ref 30.0–36.0)
MCV: 93.7 fL (ref 80.0–100.0)
Monocytes Absolute: 0.4 10*3/uL (ref 0.1–1.0)
Monocytes Relative: 7 %
Neutro Abs: 3.3 10*3/uL (ref 1.7–7.7)
Neutrophils Relative %: 53 %
Platelet Count: 204 10*3/uL (ref 150–400)
RBC: 4.29 MIL/uL (ref 3.87–5.11)
RDW: 11.9 % (ref 11.5–15.5)
WBC Count: 6.2 10*3/uL (ref 4.0–10.5)
nRBC: 0 % (ref 0.0–0.2)

## 2021-11-30 LAB — CMP (CANCER CENTER ONLY)
ALT: 16 U/L (ref 0–44)
AST: 17 U/L (ref 15–41)
Albumin: 4.5 g/dL (ref 3.5–5.0)
Alkaline Phosphatase: 58 U/L (ref 38–126)
Anion gap: 5 (ref 5–15)
BUN: 11 mg/dL (ref 8–23)
CO2: 29 mmol/L (ref 22–32)
Calcium: 9.9 mg/dL (ref 8.9–10.3)
Chloride: 104 mmol/L (ref 98–111)
Creatinine: 0.88 mg/dL (ref 0.44–1.00)
GFR, Estimated: >60 mL/min (ref >60)
Glucose, Bld: 114 mg/dL — ABNORMAL HIGH (ref 70–99)
Potassium: 4.4 mmol/L (ref 3.5–5.1)
Sodium: 138 mmol/L (ref 135–145)
Total Bilirubin: 0.4 mg/dL (ref 0.3–1.2)
Total Protein: 7.2 g/dL (ref 6.5–8.1)

## 2021-11-30 MED ORDER — SODIUM CHLORIDE (PF) 0.9 % IJ SOLN
INTRAMUSCULAR | Status: AC
  Filled 2021-11-30: qty 50

## 2021-11-30 MED ORDER — IOHEXOL 300 MG/ML  SOLN
100.0000 mL | Freq: Once | INTRAMUSCULAR | Status: AC | PRN
  Administered 2021-11-30: 75 mL via INTRAVENOUS

## 2021-12-03 ENCOUNTER — Inpatient Hospital Stay: Payer: 59 | Admitting: Physician Assistant

## 2021-12-03 ENCOUNTER — Inpatient Hospital Stay: Payer: 59 | Admitting: Internal Medicine

## 2021-12-03 VITALS — BP 118/68 | HR 80 | Temp 98.4°F | Resp 18 | Ht 65.0 in | Wt 119.3 lb

## 2021-12-03 DIAGNOSIS — C3490 Malignant neoplasm of unspecified part of unspecified bronchus or lung: Secondary | ICD-10-CM | POA: Diagnosis not present

## 2021-12-03 DIAGNOSIS — R739 Hyperglycemia, unspecified: Secondary | ICD-10-CM | POA: Diagnosis not present

## 2021-12-03 DIAGNOSIS — C3432 Malignant neoplasm of lower lobe, left bronchus or lung: Secondary | ICD-10-CM

## 2021-12-03 DIAGNOSIS — Z902 Acquired absence of lung [part of]: Secondary | ICD-10-CM | POA: Diagnosis not present

## 2021-12-03 DIAGNOSIS — Z9221 Personal history of antineoplastic chemotherapy: Secondary | ICD-10-CM | POA: Diagnosis not present

## 2021-12-03 NOTE — Progress Notes (Signed)
Brandi Crosby, [place] [id number]  DIAGNOSIS:  Limited stage (T2 a, N0, M0) of small cell lung cancer presented with left lower lobe lung mass status post left lower lobectomy with lymph node dissection on September 30, 2017.  PRIOR THERAPY:  1) status post left lower lobectomy with lymph node dissection on September 30, 2017. 2) Adjuvant systemic chemotherapy with 4 cycles of cisplatin 80 mg/M2 on day 1 and etoposide 100 mg/M2 on days 1, 2 and 3 every 3 weeks.  First dose Nov 03, 2017.  Status post 4 cycles.  Last dose was given January 12, 2018.  CURRENT THERAPY: Observation.  INTERVAL HISTORY: Brandi Crosby 66 y.o. female returns to the clinic today for a follow up visit.  She was last seen in clinic 1 year ago.  The patient has been on observation since 2019 and feeling well.  She denies any recent fever, chills, night sweats, or unexplained weight loss.  She reports that she is trying to eat healthy and walk in her neighborhood which [place].  She reports her breathing has been "good".  She reports she is very active and does a lot of moving.  No significant dyspnea on exertion.  She denies any cough, hemoptysis, or chest pain.  Denies any nausea, vomiting, diarrhea, or constipation.  She denies any headaches.  She reports she has developed some "floaters" and she has an upcoming appointment with her eye doctor.  She mentions that she saw her PCP for an annual visit a few weeks ago and they recommended cholesterol medicine.  She is also concerned that her blood sugar was slightly elevated on her fasting labs prior to getting her scan done.  She is here today to review her scan results. Brandi Crosby Kitchen    MEDICAL HISTORY: Past Medical History:  Diagnosis Date   Allergy    Anxiety    Depression    Hyperlipemia    Lung mass    SCL ca dx'd 08/2017   Shingles     ALLERGIES:  is allergic to augmentin  [amoxicillin-pot clavulanate] and penicillins.  MEDICATIONS:  Current Outpatient Medications  Medication Sig Dispense Refill   ALPRAZolam (XANAX) 0.5 MG tablet Take 0.5 mg by mouth at bedtime as needed for anxiety or sleep.      Ascorbic Acid (VITAMIN C) 1000 MG tablet Take 1,000 mg by mouth daily.       cholecalciferol (VITAMIN D) 1000 units tablet Take 1,000 Units by mouth daily.     escitalopram (LEXAPRO) 10 MG tablet Take 10 mg by mouth daily.      No current facility-administered medications for this visit.    SURGICAL HISTORY:  Past Surgical History:  Procedure Laterality Date   DENTAL SURGERY  2010   implant   NODE DISSECTION Left 09/30/2017   Procedure: NODE DISSECTION;  Surgeon: Grace Isaac, MD;  Location: [place];  Service: Thoracic;  Laterality: Left;   ORIF TIBIA PLATEAU Left 07/05/2019   Procedure: OPEN REDUCTION INTERNAL FIXATION (ORIF) TIBIAL PLATEAU;  Surgeon: Shona Needles, MD;  Location: [place];  Service: Orthopedics;  Laterality: Left;   RIGHT OOPHORECTOMY     TONSILLECTOMY AND ADENOIDECTOMY     VIDEO ASSISTED THORACOSCOPY (VATS)/WEDGE RESECTION Left 09/30/2017   Procedure: VIDEO ASSISTED THORACOSCOPY (VATS)/LUNG RESECTION;  Surgeon: Grace Isaac, MD;  Location: [place];  Service: Thoracic;  Laterality: Left;  VIDEO BRONCHOSCOPY N/A 09/30/2017   Procedure: VIDEO BRONCHOSCOPY;  Surgeon: Grace Isaac, MD;  Location: [facility];  Service: Thoracic;  Laterality: N/A;   WISDOM TOOTH EXTRACTION      REVIEW OF SYSTEMS:   Review of Systems  Constitutional: Negative for appetite change, chills, fatigue, fever and unexpected weight change.  HENT: Negative for mouth sores, nosebleeds, sore throat and trouble swallowing.   Eyes: Negative for eye problems and icterus.  Respiratory: Negative for cough, hemoptysis, shortness of breath and wheezing.   Cardiovascular: Negative for chest pain and leg swelling.  Gastrointestinal: Negative for abdominal pain, constipation,  diarrhea, nausea and vomiting.  Genitourinary: Negative for bladder incontinence, difficulty urinating, dysuria, frequency and hematuria.   Musculoskeletal: Negative for back pain, gait problem, neck pain and neck stiffness.  Skin: Negative for itching and rash.  Neurological: Negative for dizziness, extremity weakness, gait problem, headaches, light-headedness and seizures.  Hematological: Negative for adenopathy. Does not bruise/bleed easily.  Psychiatric/Behavioral: Negative for confusion, depression and sleep disturbance. The patient is not nervous/anxious.     PHYSICAL EXAMINATION:  Blood pressure 118/68, pulse 80, temperature 98.4 F (36.9 C), temperature source Oral, resp. rate 18, height 5\' 5"  (1.651 m), weight 119 lb 4.8 oz (54.1 kg), SpO2 98 %.  ECOG PERFORMANCE STATUS: 0  Physical Exam  Constitutional: Oriented to person, place, and time and well-developed, well-nourished, and in no distress.  HENT:  Head: Normocephalic and atraumatic.  Mouth/Throat: Oropharynx is clear and moist. No oropharyngeal exudate.  Eyes: Conjunctivae are normal. Right eye exhibits no discharge. Left eye exhibits no discharge. No scleral icterus.  Neck: Normal range of motion. Neck supple.  Cardiovascular: Normal rate, regular rhythm, normal heart sounds and intact distal pulses.   Pulmonary/Chest: Effort normal and breath sounds normal. No respiratory distress. No wheezes. No rales.  Abdominal: Soft. Bowel sounds are normal. Exhibits no distension and no mass. There is no tenderness.  Musculoskeletal: Normal range of motion. Exhibits no edema.  Lymphadenopathy:    No cervical adenopathy.  Neurological: Alert and oriented to person, place, and time. Exhibits normal muscle tone. Gait normal. Coordination normal.  Skin: Skin is warm and dry. No rash noted. Not diaphoretic. No erythema. No pallor.  Psychiatric: Mood, memory and judgment normal.  Vitals reviewed.  LABORATORY DATA: Lab Results   Component Value Date   WBC 6.2 11/30/2021   HGB 14.1 11/30/2021   HCT 40.2 11/30/2021   MCV 93.7 11/30/2021   PLT 204 11/30/2021      Chemistry      Component Value Date/Time   NA 138 11/30/2021 1030   K 4.4 11/30/2021 1030   CL 104 11/30/2021 1030   CO2 29 11/30/2021 1030   BUN 11 11/30/2021 1030   CREATININE 0.88 11/30/2021 1030      Component Value Date/Time   CALCIUM 9.9 11/30/2021 1030   ALKPHOS 58 11/30/2021 1030   AST 17 11/30/2021 1030   ALT 16 11/30/2021 1030   BILITOT 0.4 11/30/2021 1030       RADIOGRAPHIC STUDIES:  CT Chest W Contrast  Result Date: 12/03/2021 CLINICAL DATA:  66 year old female with history of non-small cell lung cancer. Staging examination. * Tracking Code: BO * EXAM: CT CHEST WITH CONTRAST TECHNIQUE: Multidetector CT imaging of the chest was performed during intravenous contrast administration. RADIATION DOSE REDUCTION: This exam was performed according to the departmental dose-optimization program which includes automated exposure control, adjustment of the mA and/or kV according to patient size and/or use of iterative reconstruction  technique. CONTRAST:  12mL OMNIPAQUE IOHEXOL 300 MG/ML  SOLN COMPARISON:  Chest CT 12/01/2020. FINDINGS: Cardiovascular: Heart size is normal. There is no significant pericardial fluid, thickening or pericardial calcification. There is aortic atherosclerosis, as well as atherosclerosis of the great vessels of the mediastinum and the coronary arteries, including calcified atherosclerotic plaque in the left anterior descending coronary artery. Mediastinum/Nodes: No pathologically enlarged mediastinal or hilar lymph nodes. Esophagus is unremarkable in appearance. No axillary lymphadenopathy. Lungs/Pleura: Status post left lower lobectomy. Compensatory hyperexpansion of the left upper lobe. No suspicious appearing pulmonary nodules or masses are noted. No acute consolidative airspace disease. No pleural effusions. Upper  Abdomen: Aortic atherosclerosis. Diffuse low attenuation throughout the visualized hepatic parenchyma, indicative of a background of hepatic steatosis. Musculoskeletal: There are no aggressive appearing lytic or blastic lesions noted in the visualized portions of the skeleton. IMPRESSION: 1. Stable examination with no findings to suggest locally recurrent tumor or metastatic disease in the chest. 2. Aortic atherosclerosis, in addition to left anterior descending coronary artery disease. Please note that although the presence of coronary artery calcium documents the presence of coronary artery disease, the severity of this disease and any potential stenosis cannot be assessed on this non-gated CT examination. Assessment for potential risk factor modification, dietary therapy or pharmacologic therapy may be warranted, if clinically indicated. 3. Hepatic steatosis. Aortic Atherosclerosis (ICD10-I70.0). Electronically Signed   By: Vinnie Langton M.D.   On: 12/03/2021 08:59     ASSESSMENT/PLAN:  This is a very pleasant 66 year old Caucasian female diagnosed with limited stage small cell lung cancer.  She status post resection.  She then completed adjuvant treatment with systemic chemotherapy with cisplatin and etoposide for 4 cycles.  She has been on observation since 2019 and feeling fine.  The patient recently had a restaging CT scan performed.  Dr. Julien Nordmann personally and independently reviewed the scan and discussed the results with the patient today.  The scan did not show any evidence of disease progression.  Dr. Julien Nordmann recommends that she continue on observation with a restaging CT scan of the chest in 1 year.  We will see her back for follow-up visit at that time to review her scan results.  The patient's blood sugar was slightly elevated on her fasting labs.  I encouraged her to follow-up with her PCP to ensure that she does not have diabetes or prediabetes.  Her scan also incidentally noticed  coronary artery disease and hepatic steatosis.  Also encouraged that she discuss with her PCP risk management/risk reduction with blood pressure management if needed, being active, eating healthy, and statins if recommended.  She states that her PCP recommended statins which she was concerned about taking due to side effect profile.  I encouraged her to have the risks and benefits discussion with her PCP regarding this.  The patient was advised to call immediately if she has any concerning symptoms in the interval. The patient voices understanding of current disease status and treatment options and is in agreement with the current care plan. All questions were answered. The patient knows to call the clinic with any problems, questions or concerns. We can certainly see the patient much sooner if necessary   Orders Placed This Encounter  Procedures   CT Chest W Contrast    Standing Status:   Future    Standing Expiration Date:   12/03/2022    Order Specific Question:   If indicated for the ordered procedure, I authorize the administration of contrast media per Radiology protocol  Answer:   Yes    Order Specific Question:   Preferred imaging location?    Answer:   [facility]   CBC with Differential ([place] Only)    Standing Status:   Future    Standing Expiration Date:   12/04/2022   CMP ([place] only)    Standing Status:   Future    Standing Expiration Date:   12/04/2022      Tobe Sos Athalie Newhard, PA-C 12/03/21  ADDENDUM: Hematology/Oncology Attending: I had a face-to-face encounter with the patient today.  I reviewed her records, lab, scan and recommended her care plan.  This is a very pleasant 66 years old white female with history of limited stage small cell lung cancer status postresection followed by 4 cycles of systemic chemotherapy with cisplatin and etoposide and has been on observation since 2019. The patient is feeling fine today with no concerning  complaints. She had repeat CT scan of the chest performed recently.  I personally and independently reviewed the scan and discussed the results with the patient today. Her scan showed no concerning findings for disease recurrence or metastasis. I recommended for her to continue on observation with repeat CT scan of the chest in 1 year. The patient was advised to call immediately if she has any other concerning symptoms in the interval. The total time spent in the appointment was 20 minutes. Disclaimer: This note was dictated with voice recognition software. Similar sounding words can inadvertently be transcribed and may be missed upon review. Eilleen Kempf, MD

## 2021-12-05 ENCOUNTER — Telehealth: Payer: Self-pay | Admitting: Internal Medicine

## 2021-12-05 NOTE — Telephone Encounter (Signed)
Scheduled per 06/13 los, patient has been called and notified of upcoming appointments.

## 2022-02-20 ENCOUNTER — Ambulatory Visit
Admission: RE | Admit: 2022-02-20 | Discharge: 2022-02-20 | Disposition: A | Discharge status: Home / Self Care | Payer: 59 | Source: Ambulatory Visit | Attending: Family Medicine | Admitting: Family Medicine

## 2022-02-20 DIAGNOSIS — Z1231 Encounter for screening mammogram for malignant neoplasm of breast: Secondary | ICD-10-CM

## 2022-05-09 ENCOUNTER — Ambulatory Visit
Admission: RE | Admit: 2022-05-09 | Discharge: 2022-05-09 | Disposition: A | Discharge status: Home / Self Care | Payer: 59 | Source: Ambulatory Visit | Attending: Family Medicine | Admitting: Family Medicine

## 2022-05-09 DIAGNOSIS — M810 Age-related osteoporosis without current pathological fracture: Secondary | ICD-10-CM

## 2022-11-26 ENCOUNTER — Ambulatory Visit (HOSPITAL_COMMUNITY)
Admission: RE | Admit: 2022-11-26 | Discharge: 2022-11-26 | Disposition: A | Discharge status: Home / Self Care | Payer: Medicare Other | Source: Ambulatory Visit | Attending: Physician Assistant | Admitting: Physician Assistant

## 2022-11-26 ENCOUNTER — Other Ambulatory Visit: Payer: Self-pay

## 2022-11-26 ENCOUNTER — Inpatient Hospital Stay: Payer: Medicare Other | Attending: Physician Assistant

## 2022-11-26 DIAGNOSIS — Z902 Acquired absence of lung [part of]: Secondary | ICD-10-CM | POA: Insufficient documentation

## 2022-11-26 DIAGNOSIS — C3490 Malignant neoplasm of unspecified part of unspecified bronchus or lung: Secondary | ICD-10-CM | POA: Diagnosis present

## 2022-11-26 DIAGNOSIS — Z79899 Other long term (current) drug therapy: Secondary | ICD-10-CM | POA: Insufficient documentation

## 2022-11-26 DIAGNOSIS — R911 Solitary pulmonary nodule: Secondary | ICD-10-CM | POA: Insufficient documentation

## 2022-11-26 DIAGNOSIS — Z87891 Personal history of nicotine dependence: Secondary | ICD-10-CM | POA: Insufficient documentation

## 2022-11-26 DIAGNOSIS — C3432 Malignant neoplasm of lower lobe, left bronchus or lung: Secondary | ICD-10-CM

## 2022-11-26 DIAGNOSIS — Z85118 Personal history of other malignant neoplasm of bronchus and lung: Secondary | ICD-10-CM | POA: Insufficient documentation

## 2022-11-26 LAB — CBC WITH DIFFERENTIAL (CANCER CENTER ONLY)
Abs Immature Granulocytes: 0.02 10*3/uL (ref 0.00–0.07)
Basophils Absolute: 0 10*3/uL (ref 0.0–0.1)
Basophils Relative: 1 %
Eosinophils Absolute: 0.1 10*3/uL (ref 0.0–0.5)
Eosinophils Relative: 1 %
HCT: 40.5 % (ref 36.0–46.0)
Hemoglobin: 14.1 g/dL (ref 12.0–15.0)
Immature Granulocytes: 0 %
Lymphocytes Relative: 29 %
Lymphs Abs: 2 10*3/uL (ref 0.7–4.0)
MCH: 32.6 pg (ref 26.0–34.0)
MCHC: 34.8 g/dL (ref 30.0–36.0)
MCV: 93.8 fL (ref 80.0–100.0)
Monocytes Absolute: 0.6 10*3/uL (ref 0.1–1.0)
Monocytes Relative: 9 %
Neutro Abs: 4.1 10*3/uL (ref 1.7–7.7)
Neutrophils Relative %: 60 %
Platelet Count: 202 10*3/uL (ref 150–400)
RBC: 4.32 MIL/uL (ref 3.87–5.11)
RDW: 11.9 % (ref 11.5–15.5)
WBC Count: 6.9 10*3/uL (ref 4.0–10.5)
nRBC: 0 % (ref 0.0–0.2)

## 2022-11-26 LAB — CMP (CANCER CENTER ONLY)
ALT: 15 U/L (ref 0–44)
AST: 19 U/L (ref 15–41)
Albumin: 4.5 g/dL (ref 3.5–5.0)
Alkaline Phosphatase: 58 U/L (ref 38–126)
Anion gap: 6 (ref 5–15)
BUN: 16 mg/dL (ref 8–23)
CO2: 27 mmol/L (ref 22–32)
Calcium: 9.6 mg/dL (ref 8.9–10.3)
Chloride: 105 mmol/L (ref 98–111)
Creatinine: 0.92 mg/dL (ref 0.44–1.00)
GFR, Estimated: >60 mL/min (ref >60)
Glucose, Bld: 125 mg/dL — ABNORMAL HIGH (ref 70–99)
Potassium: 4.2 mmol/L (ref 3.5–5.1)
Sodium: 138 mmol/L (ref 135–145)
Total Bilirubin: 0.4 mg/dL (ref 0.3–1.2)
Total Protein: 7.1 g/dL (ref 6.5–8.1)

## 2022-11-26 MED ORDER — IOHEXOL 300 MG/ML  SOLN
75.0000 mL | Freq: Once | INTRAMUSCULAR | Status: AC | PRN
  Administered 2022-11-26: 75 mL via INTRAVENOUS

## 2022-11-26 NOTE — Progress Notes (Unsigned)
[facility] OFFICE PROGRESS NOTE  [facility] [address] [place] [address]  DIAGNOSIS: Limited stage (T2 a, N0, M0) of small cell lung cancer presented with left lower lobe lung mass status post left lower lobectomy with lymph node dissection on September 30, 2017.   PRIOR THERAPY: 1) status post left lower lobectomy with lymph node dissection on September 30, 2017. 2) Adjuvant systemic chemotherapy with 4 cycles of cisplatin 80 mg/M2 on day 1 and etoposide 100 mg/M2 on days 1, 2 and 3 every 3 weeks.  First dose Nov 03, 2017.  Status post 4 cycles.  Last dose was given January 12, 2018.  CURRENT THERAPY: Observation   INTERVAL HISTORY: Brandi Crosby 67 y.o. female returns to the clinic today for a follow up visit.  She was last seen in clinic 1 year ago. The patient has been on observation since 2019 and feeling well.  She denies any recent fever, chills, night sweats, or unexplained weight loss.  She is doing a lot of yard work. She reports her breathing has been "good".  Denies significant dyspnea on exertion.  She denies any cough, hemoptysis, or chest pain.  Denies any nausea, vomiting, diarrhea, or constipation.  She denies any headaches.  She recently had a restaging CT scan performed.  She is here today to review her scan results.     MEDICAL HISTORY: Past Medical History:  Diagnosis Date   Allergy    Anxiety    Depression    Hyperlipemia    Lung mass    SCL ca dx'd 08/2017   Shingles     ALLERGIES:  is allergic to augmentin [amoxicillin-pot clavulanate] and penicillins.  MEDICATIONS:  Current Outpatient Medications  Medication Sig Dispense Refill   ALPRAZolam (XANAX) 0.5 MG tablet Take 0.5 mg by mouth at bedtime as needed for anxiety or sleep.      Ascorbic Acid (VITAMIN C) 1000 MG tablet Take 1,000 mg by mouth daily.       cholecalciferol (VITAMIN D) 1000 units tablet Take 1,000 Units by mouth daily.     escitalopram (LEXAPRO) 10  MG tablet Take 10 mg by mouth daily.      No current facility-administered medications for this visit.    SURGICAL HISTORY:  Past Surgical History:  Procedure Laterality Date   DENTAL SURGERY  2010   implant   NODE DISSECTION Left 09/30/2017   Procedure: NODE DISSECTION;  Surgeon: Delight Ovens, MD;  Location: [facility] OR;  Service: Thoracic;  Laterality: Left;   ORIF TIBIA PLATEAU Left 07/05/2019   Procedure: OPEN REDUCTION INTERNAL FIXATION (ORIF) TIBIAL PLATEAU;  Surgeon: Roby Lofts, MD;  Location: [facility] OR;  Service: Orthopedics;  Laterality: Left;   RIGHT OOPHORECTOMY     TONSILLECTOMY AND ADENOIDECTOMY     VIDEO ASSISTED THORACOSCOPY (VATS)/WEDGE RESECTION Left 09/30/2017   Procedure: VIDEO ASSISTED THORACOSCOPY (VATS)/LUNG RESECTION;  Surgeon: Delight Ovens, MD;  Location: [facility] OR;  Service: Thoracic;  Laterality: Left;   VIDEO BRONCHOSCOPY N/A 09/30/2017   Procedure: VIDEO BRONCHOSCOPY;  Surgeon: Delight Ovens, MD;  Location: [facility] OR;  Service: Thoracic;  Laterality: N/A;   WISDOM TOOTH EXTRACTION      REVIEW OF SYSTEMS:   Review of Systems  Constitutional: Negative for appetite change, chills, fatigue, fever and unexpected weight change.  HENT: Negative for mouth sores, nosebleeds, sore throat and trouble swallowing.   Eyes: Negative for eye problems and icterus.  Respiratory: Negative for cough, hemoptysis, shortness of breath and wheezing.   Cardiovascular: Negative for chest pain and leg swelling.  Gastrointestinal: Negative for abdominal pain, constipation, diarrhea, nausea and vomiting.  Genitourinary: Negative for bladder incontinence, difficulty urinating, dysuria, frequency and hematuria.   Musculoskeletal: Negative for back pain, gait problem, neck pain and neck stiffness.  Skin: Negative for itching and rash.  Neurological: Negative for dizziness, extremity weakness, gait problem, headaches, light-headedness and seizures.  Hematological: Negative for adenopathy.  Does not bruise/bleed easily.  Psychiatric/Behavioral: Negative for confusion, depression and sleep disturbance. The patient is not nervous/anxious   PHYSICAL EXAMINATION:  Blood pressure 122/77, pulse 97, temperature 98.3 F (36.8 C), temperature source Oral, resp. rate 17, weight 112 lb 14.4 oz (51.2 kg), SpO2 97 %.  ECOG PERFORMANCE STATUS: 0  Physical Exam  Constitutional: Oriented to person, place, and time and well-developed, well-nourished, and in no distress.  HENT:  Head: Normocephalic and atraumatic.  Mouth/Throat: Oropharynx is clear and moist. No oropharyngeal exudate.  Eyes: Conjunctivae are normal. Right eye exhibits no discharge. Left eye exhibits no discharge. No scleral icterus.  Neck: Normal range of motion. Neck supple.  Cardiovascular: Normal rate, regular rhythm, normal heart sounds and intact distal pulses.   Pulmonary/Chest: Effort normal and breath sounds normal. No respiratory distress. No wheezes. No rales.  Abdominal: Soft. Bowel sounds are normal. Exhibits no distension and no mass. There is no tenderness.  Musculoskeletal: Normal range of motion. Exhibits no edema.  Lymphadenopathy:    No cervical adenopathy.  Neurological: Alert and oriented to person, place, and time. Exhibits normal muscle tone. Gait normal. Coordination normal.  Skin: Skin is warm and dry. No rash noted. Not diaphoretic. No erythema. No pallor.  Psychiatric: Mood, memory and judgment normal.  Vitals reviewed.  LABORATORY DATA: Lab Results  Component Value Date   WBC 6.9 11/26/2022   HGB 14.1 11/26/2022   HCT 40.5 11/26/2022   MCV 93.8 11/26/2022   PLT 202 11/26/2022      Chemistry      Component Value Date/Time   NA 138 11/26/2022 1113   K 4.2 11/26/2022 1113   CL 105 11/26/2022 1113   CO2 27 11/26/2022 1113   BUN 16 11/26/2022 1113   CREATININE 0.92 11/26/2022 1113      Component Value Date/Time   CALCIUM 9.6 11/26/2022 1113   ALKPHOS 58 11/26/2022 1113   AST 19  11/26/2022 1113   ALT 15 11/26/2022 1113   BILITOT 0.4 11/26/2022 1113       RADIOGRAPHIC STUDIES:  CT Chest W Contrast  Result Date: 11/28/2022 CLINICAL DATA:  Small-cell lung cancer.  Restaging. EXAM: CT CHEST WITH CONTRAST TECHNIQUE: Multidetector CT imaging of the chest was performed during intravenous contrast administration. RADIATION DOSE REDUCTION: This exam was performed according to the departmental dose-optimization program which includes automated exposure control, adjustment of the mA and/or kV according to patient size and/or use of iterative reconstruction technique. CONTRAST:  75mL OMNIPAQUE IOHEXOL 300 MG/ML  SOLN COMPARISON:  11/30/2021 FINDINGS: Cardiovascular: The heart size is normal. No substantial pericardial effusion. Coronary artery calcification is evident. Mild atherosclerotic calcification is noted in the wall of the thoracic aorta. Mediastinum/Nodes: No mediastinal lymphadenopathy. There is no hilar lymphadenopathy. The esophagus has normal imaging features. There is no axillary lymphadenopathy. Lungs/Pleura: New small focus of atelectasis noted in the perifissural anterior right upper lobe. 4 mm anterior right upper lobe nodule on 56/5 is new in the interval. Sequelae of left lower lobectomy again noted.  Upper Abdomen: Visualized portion of the upper abdomen is unremarkable. Musculoskeletal: No worrisome lytic or sclerotic osseous abnormality. IMPRESSION: 1. New 4 mm anterior right upper lobe pulmonary nodule. Likely benign, continued close attention on follow-up restaging studies recommended. 2. Stable sequelae of left lower lobectomy. 3.  Aortic Atherosclerosis (ICD10-I70.0). Electronically Signed   By: Kennith Center M.D.   On: 11/28/2022 09:15     ASSESSMENT/PLAN:  This is a very pleasant 67 year old Caucasian female diagnosed with limited stage small cell lung cancer. She status post resection. She then completed adjuvant treatment with systemic chemotherapy with  cisplatin and etoposide for 4 cycles. She has been on observation since 2019 and feeling fine.   The patient recently had a restaging CT scan performed. Dr. Arbutus Ped personally and independently reviewed the scan and discussed the results with the patient today. The scan did not show any evidence of disease progression except a small 4 mm nodule that is likely benign inflammatory. Dr. Arbutus Ped recommends that she continue on observation with a restaging CT scan of the chest in 1 year.   We will see her back for follow-up visit at that time to review her scan results.  The patient was advised to call immediately if she has any concerning symptoms in the interval. The patient voices understanding of current disease status and treatment options and is in agreement with the current care plan. All questions were answered. The patient knows to call the clinic with any problems, questions or concerns. We can certainly see the patient much sooner if necessary  No orders of the defined types were placed in this encounter.    Jeromiah Ohalloran L Dashea Mcmullan, PA-C 11/28/22  ADDENDUM: Hematology/Oncology Attending:  I had a face-to-face encounter with the patient today.  I reviewed her records, lab, scan and recommended her care plan.  This is a very pleasant 66 years old white female with history of limited stage small cell lung cancer diagnosed in April 2019 status post left lower lobectomy with lymph node dissection followed by 4 cycles of systemic chemotherapy with cisplatin and [place] side completed in July 2019.  The patient has been on observation since that time.  She is feeling fine with no concerning complaints.  She is here today for evaluation with repeat CT scan of the chest. I personally and independently reviewed the scan images and discussed the result with the patient today. Her scan showed no concerning findings for disease recurrence or metastasis but there was new 0.4 cm anterior right upper  lobe pulmonary nodule that is likely benign but require attention on upcoming imaging studies. I recommended for the patient to continue on observation and we will see her back for follow-up visit in 1 year for evaluation and repeat CT scan of the chest for restaging of her disease. She was advised to call immediately if she has any other concerning symptoms in the interval. Disclaimer: This note was dictated with voice recognition software. Similar sounding words can inadvertently be transcribed and may be missed upon review. Lajuana Matte, MD 11/28/22

## 2022-11-28 ENCOUNTER — Ambulatory Visit: Payer: 59 | Admitting: Internal Medicine

## 2022-11-28 ENCOUNTER — Inpatient Hospital Stay: Payer: Medicare Other | Admitting: Physician Assistant

## 2022-11-28 VITALS — BP 122/77 | HR 97 | Temp 98.3°F | Resp 17 | Wt 112.9 lb

## 2022-11-28 DIAGNOSIS — Z79899 Other long term (current) drug therapy: Secondary | ICD-10-CM | POA: Diagnosis not present

## 2022-11-28 DIAGNOSIS — C3432 Malignant neoplasm of lower lobe, left bronchus or lung: Secondary | ICD-10-CM | POA: Diagnosis not present

## 2022-11-28 DIAGNOSIS — Z87891 Personal history of nicotine dependence: Secondary | ICD-10-CM | POA: Diagnosis not present

## 2022-11-28 DIAGNOSIS — Z902 Acquired absence of lung [part of]: Secondary | ICD-10-CM | POA: Diagnosis not present

## 2022-11-28 DIAGNOSIS — R911 Solitary pulmonary nodule: Secondary | ICD-10-CM | POA: Diagnosis present

## 2022-11-28 DIAGNOSIS — Z85118 Personal history of other malignant neoplasm of bronchus and lung: Secondary | ICD-10-CM | POA: Diagnosis not present

## 2023-04-11 ENCOUNTER — Other Ambulatory Visit: Payer: Self-pay

## 2023-04-11 ENCOUNTER — Other Ambulatory Visit: Payer: Self-pay | Admitting: Physician Assistant

## 2023-04-11 DIAGNOSIS — Z1231 Encounter for screening mammogram for malignant neoplasm of breast: Secondary | ICD-10-CM

## 2023-05-06 ENCOUNTER — Ambulatory Visit
Admission: RE | Admit: 2023-05-06 | Discharge: 2023-05-06 | Disposition: A | Discharge status: Home / Self Care | Payer: Medicare Other | Source: Ambulatory Visit

## 2023-05-06 DIAGNOSIS — Z1231 Encounter for screening mammogram for malignant neoplasm of breast: Secondary | ICD-10-CM

## 2023-09-05 LAB — COLOGUARD: COLOGUARD: POSITIVE — AB

## 2023-09-05 LAB — EXTERNAL GENERIC LAB PROCEDURE: COLOGUARD: POSITIVE — AB

## 2023-11-24 ENCOUNTER — Encounter (HOSPITAL_COMMUNITY): Payer: Self-pay

## 2023-11-24 ENCOUNTER — Ambulatory Visit (HOSPITAL_COMMUNITY)
Admission: RE | Admit: 2023-11-24 | Discharge: 2023-11-24 | Disposition: A | Discharge status: Home / Self Care | Source: Ambulatory Visit | Attending: Physician Assistant | Admitting: Physician Assistant

## 2023-11-24 ENCOUNTER — Other Ambulatory Visit: Payer: Medicare Other

## 2023-11-24 ENCOUNTER — Inpatient Hospital Stay: Attending: Physician Assistant

## 2023-11-24 DIAGNOSIS — Z85118 Personal history of other malignant neoplasm of bronchus and lung: Secondary | ICD-10-CM | POA: Insufficient documentation

## 2023-11-24 DIAGNOSIS — C3432 Malignant neoplasm of lower lobe, left bronchus or lung: Secondary | ICD-10-CM | POA: Insufficient documentation

## 2023-11-24 DIAGNOSIS — Z9221 Personal history of antineoplastic chemotherapy: Secondary | ICD-10-CM | POA: Insufficient documentation

## 2023-11-24 DIAGNOSIS — Z902 Acquired absence of lung [part of]: Secondary | ICD-10-CM | POA: Insufficient documentation

## 2023-11-24 LAB — CBC WITH DIFFERENTIAL (CANCER CENTER ONLY)
Abs Immature Granulocytes: 0.02 10*3/uL (ref 0.00–0.07)
Basophils Absolute: 0 10*3/uL (ref 0.0–0.1)
Basophils Relative: 1 %
Eosinophils Absolute: 0 10*3/uL (ref 0.0–0.5)
Eosinophils Relative: 1 %
HCT: 39.6 % (ref 36.0–46.0)
Hemoglobin: 13.9 g/dL (ref 12.0–15.0)
Immature Granulocytes: 0 %
Lymphocytes Relative: 24 %
Lymphs Abs: 1.6 10*3/uL (ref 0.7–4.0)
MCH: 32.3 pg (ref 26.0–34.0)
MCHC: 35.1 g/dL (ref 30.0–36.0)
MCV: 91.9 fL (ref 80.0–100.0)
Monocytes Absolute: 0.6 10*3/uL (ref 0.1–1.0)
Monocytes Relative: 8 %
Neutro Abs: 4.4 10*3/uL (ref 1.7–7.7)
Neutrophils Relative %: 66 %
Platelet Count: 220 10*3/uL (ref 150–400)
RBC: 4.31 MIL/uL (ref 3.87–5.11)
RDW: 12.1 % (ref 11.5–15.5)
WBC Count: 6.7 10*3/uL (ref 4.0–10.5)
nRBC: 0 % (ref 0.0–0.2)

## 2023-11-24 LAB — CMP (CANCER CENTER ONLY)
ALT: 12 U/L (ref 0–44)
AST: 17 U/L (ref 15–41)
Albumin: 4.4 g/dL (ref 3.5–5.0)
Alkaline Phosphatase: 62 U/L (ref 38–126)
Anion gap: 5 (ref 5–15)
BUN: 12 mg/dL (ref 8–23)
CO2: 31 mmol/L (ref 22–32)
Calcium: 9.6 mg/dL (ref 8.9–10.3)
Chloride: 104 mmol/L (ref 98–111)
Creatinine: 0.86 mg/dL (ref 0.44–1.00)
GFR, Estimated: >60 mL/min (ref >60)
Glucose, Bld: 101 mg/dL — ABNORMAL HIGH (ref 70–99)
Potassium: 3.9 mmol/L (ref 3.5–5.1)
Sodium: 140 mmol/L (ref 135–145)
Total Bilirubin: 0.4 mg/dL (ref 0.0–1.2)
Total Protein: 6.9 g/dL (ref 6.5–8.1)

## 2023-11-24 MED ORDER — IOHEXOL 300 MG/ML  SOLN
75.0000 mL | Freq: Once | INTRAMUSCULAR | Status: AC | PRN
  Administered 2023-11-24: 75 mL via INTRAVENOUS

## 2023-11-24 MED ORDER — SODIUM CHLORIDE (PF) 0.9 % IJ SOLN
INTRAMUSCULAR | Status: AC
  Filled 2023-11-24: qty 50

## 2023-11-25 ENCOUNTER — Telehealth: Payer: Self-pay | Admitting: Physician Assistant

## 2023-11-25 NOTE — Telephone Encounter (Signed)
 Left the patient a voicemail with the rescheduled appointment details. The patient is active on MyChart.

## 2023-11-25 NOTE — Progress Notes (Unsigned)
 [name] OFFICE PROGRESS NOTE  [facility] [name] [place] [address]  DIAGNOSIS: Limited stage (T2 a, N0, M0) of small cell lung cancer presented with left lower lobe lung mass status post left lower lobectomy with lymph node dissection on September 30, 2017.   PRIOR THERAPY: 1) status post left lower lobectomy with lymph node dissection on September 30, 2017. 2) Adjuvant systemic chemotherapy with 4 cycles of cisplatin  80 mg/M2 on day 1 and etoposide  100 mg/M2 on days 1, 2 and 3 every 3 weeks.  First dose Nov 03, 2017.  Status post 4 cycles.  Last dose was given January 12, 2018.  CURRENT THERAPY: Observation   INTERVAL HISTORY: Brandi Crosby 69 y.o. female returns to the clinic today for a follow up visit.  She was last seen in clinic 1 year ago. The patient has been on observation since 2019 and feeling well.  She denies any recent fever, chills, night sweats, or unexplained weight loss. She reports her breathing has been "***.  Denies significant dyspnea on exertion.  She denies any cough, hemoptysis, or chest pain.  Denies any nausea, vomiting, diarrhea, or constipation.  She denies any headaches.  She recently had a restaging CT scan performed.  She is here today to review her scan results.    MEDICAL HISTORY: Past Medical History:  Diagnosis Date   Allergy    Anxiety    Depression    Hyperlipemia    Lung mass    SCL ca dx'd 08/2017   Shingles     ALLERGIES:  is allergic to augmentin [amoxicillin-pot clavulanate] and penicillins.  MEDICATIONS:  Current Outpatient Medications  Medication Sig Dispense Refill   ALPRAZolam  (XANAX ) 0.5 MG tablet Take 0.5 mg by mouth at bedtime as needed for anxiety or sleep.      Ascorbic Acid (VITAMIN C) 1000 MG tablet Take 1,000 mg by mouth daily.       cholecalciferol (VITAMIN D) 1000 units tablet Take 1,000 Units by mouth daily.     escitalopram  (LEXAPRO ) 10 MG tablet Take 10 mg by mouth daily.       No current facility-administered medications for this visit.    SURGICAL HISTORY:  Past Surgical History:  Procedure Laterality Date   DENTAL SURGERY  2010   implant   NODE DISSECTION Left 09/30/2017   Procedure: NODE DISSECTION;  Surgeon: [name], MD;  Location: [facility] OR;  Service: Thoracic;  Laterality: Left;   ORIF TIBIA PLATEAU Left 07/05/2019   Procedure: OPEN REDUCTION INTERNAL FIXATION (ORIF) TIBIAL PLATEAU;  Surgeon: [name], MD;  Location: [facility] OR;  Service: Orthopedics;  Laterality: Left;   RIGHT OOPHORECTOMY     TONSILLECTOMY AND ADENOIDECTOMY     VIDEO ASSISTED THORACOSCOPY (VATS)/WEDGE RESECTION Left 09/30/2017   Procedure: VIDEO ASSISTED THORACOSCOPY (VATS)/LUNG RESECTION;  Surgeon: [name], MD;  Location: [facility] OR;  Service: Thoracic;  Laterality: Left;   VIDEO BRONCHOSCOPY N/A 09/30/2017   Procedure: VIDEO BRONCHOSCOPY;  Surgeon: [name], MD;  Location: [facility] OR;  Service: Thoracic;  Laterality: N/A;   WISDOM TOOTH EXTRACTION      REVIEW OF SYSTEMS:   Review of Systems  Constitutional: Negative for appetite change, chills, fatigue, fever and unexpected weight change.  HENT:   Negative for mouth sores, nosebleeds, sore throat and trouble swallowing.   Eyes: Negative for eye problems and icterus.  Respiratory: Negative for cough, hemoptysis, shortness of breath  and wheezing.   Cardiovascular: Negative for chest pain and leg swelling.  Gastrointestinal: Negative for abdominal pain, constipation, diarrhea, nausea and vomiting.  Genitourinary: Negative for bladder incontinence, difficulty urinating, dysuria, frequency and hematuria.   Musculoskeletal: Negative for back pain, gait problem, neck pain and neck stiffness.  Skin: Negative for itching and rash.  Neurological: Negative for dizziness, extremity weakness, gait problem, headaches, light-headedness and seizures.  Hematological: Negative for adenopathy. Does not bruise/bleed easily.   Psychiatric/Behavioral: Negative for confusion, depression and sleep disturbance. The patient is not nervous/anxious.     PHYSICAL EXAMINATION:  There were no vitals taken for this visit.  ECOG PERFORMANCE STATUS: {CHL ONC ECOG [id number]  Physical Exam  Constitutional: Oriented to person, place, and time and well-developed, well-nourished, and in no distress. No distress.  HENT:  Head: Normocephalic and atraumatic.  Mouth/Throat: Oropharynx is clear and moist. No oropharyngeal exudate.  Eyes: Conjunctivae are normal. Right eye exhibits no discharge. Left eye exhibits no discharge. No scleral icterus.  Neck: Normal range of motion. Neck supple.  Cardiovascular: Normal rate, regular rhythm, normal heart sounds and intact distal pulses.   Pulmonary/Chest: Effort normal and breath sounds normal. No respiratory distress. No wheezes. No rales.  Abdominal: Soft. Bowel sounds are normal. Exhibits no distension and no mass. There is no tenderness.  Musculoskeletal: Normal range of motion. Exhibits no edema.  Lymphadenopathy:    No cervical adenopathy.  Neurological: Alert and oriented to person, place, and time. Exhibits normal muscle tone. Gait normal. Coordination normal.  Skin: Skin is warm and dry. No rash noted. Not diaphoretic. No erythema. No pallor.  Psychiatric: Mood, memory and judgment normal.  Vitals reviewed.  LABORATORY DATA: Lab Results  Component Value Date   WBC 6.7 11/24/2023   HGB 13.9 11/24/2023   HCT 39.6 11/24/2023   MCV 91.9 11/24/2023   PLT 220 11/24/2023      Chemistry      Component Value Date/Time   NA 140 11/24/2023 1115   K 3.9 11/24/2023 1115   CL 104 11/24/2023 1115   CO2 31 11/24/2023 1115   BUN 12 11/24/2023 1115   CREATININE 0.86 11/24/2023 1115      Component Value Date/Time   CALCIUM 9.6 11/24/2023 1115   ALKPHOS 62 11/24/2023 1115   AST 17 11/24/2023 1115   ALT 12 11/24/2023 1115   BILITOT 0.4 11/24/2023 1115        RADIOGRAPHIC STUDIES:  No results found.   ASSESSMENT/PLAN:  This is a very pleasant 68 year old Caucasian female diagnosed with limited stage small cell lung cancer. She status post resection. She then completed adjuvant treatment with systemic chemotherapy with cisplatin  and etoposide  for 4 cycles. She has been on observation since 2019 and feeling fine.   The patient recently had a restaging CT scan performed. Dr. Marguerita Shih personally and independently reviewed the scan and discussed the results with the patient today. The scan did not show any evidence of disease progression except a small 4 mm nodule that is likely benign inflammatory. Dr. Marguerita Shih recommends that she continue on observation with a restaging CT scan of the chest in 1 year.   We will see her back for follow-up visit at that time to review her scan results.   The patient was advised to call immediately if she has any concerning symptoms in the interval. The patient voices understanding of current disease status and treatment options and is in agreement with the current care plan. All questions were answered. The patient knows  to call the clinic with any problems, questions or concerns. We can certainly see the patient much sooner if necessary       No orders of the defined types were placed in this encounter.    I spent {CHL ONC TIME VISIT - ZHYQM:[id number]} counseling the patient face to face. The total time spent in the appointment was {CHL ONC TIME VISIT - MWUXL:[id number]}.  Secundino Ellithorpe L Laquida Cotrell, PA-C 11/25/23

## 2023-11-25 NOTE — Progress Notes (Deleted)
 [name] OFFICE PROGRESS NOTE  [facility] [name] [place] [address]  DIAGNOSIS: Limited stage (T2 a, N0, M0) of small cell lung cancer presented with left lower lobe lung mass status post left lower lobectomy with lymph node dissection on September 30, 2017.   PRIOR THERAPY: 1) status post left lower lobectomy with lymph node dissection on September 30, 2017. 2) Adjuvant systemic chemotherapy with 4 cycles of cisplatin  80 mg/M2 on day 1 and etoposide  100 mg/M2 on days 1, 2 and 3 every 3 weeks.  First dose Nov 03, 2017.  Status post 4 cycles.  Last dose was given January 12, 2018.  CURRENT THERAPY: Observation   INTERVAL HISTORY: Brandi Crosby 68 y.o. female returns to the clinic today for a follow up visit.  She was last seen in clinic 1 year ago. The patient has been on observation since 2019 and feeling well.  She denies any recent fever, chills, night sweats, or unexplained weight loss. She reports her breathing has been "***.  Denies significant dyspnea on exertion.  She denies any cough, hemoptysis, or chest pain.  Denies any nausea, vomiting, diarrhea, or constipation.  She denies any headaches.  She recently had a restaging CT scan performed.  She is here today to review her scan results.    MEDICAL HISTORY: Past Medical History:  Diagnosis Date   Allergy    Anxiety    Depression    Hyperlipemia    Lung mass    SCL ca dx'd 08/2017   Shingles     ALLERGIES:  is allergic to augmentin [amoxicillin-pot clavulanate] and penicillins.  MEDICATIONS:  Current Outpatient Medications  Medication Sig Dispense Refill   ALPRAZolam  (XANAX ) 0.5 MG tablet Take 0.5 mg by mouth at bedtime as needed for anxiety or sleep.      Ascorbic Acid (VITAMIN C) 1000 MG tablet Take 1,000 mg by mouth daily.       cholecalciferol (VITAMIN D) 1000 units tablet Take 1,000 Units by mouth daily.     escitalopram  (LEXAPRO ) 10 MG tablet Take 10 mg by mouth daily.       No current facility-administered medications for this visit.    SURGICAL HISTORY:  Past Surgical History:  Procedure Laterality Date   DENTAL SURGERY  2010   implant   NODE DISSECTION Left 09/30/2017   Procedure: NODE DISSECTION;  Surgeon: [name], MD;  Location: [facility] OR;  Service: Thoracic;  Laterality: Left;   ORIF TIBIA PLATEAU Left 07/05/2019   Procedure: OPEN REDUCTION INTERNAL FIXATION (ORIF) TIBIAL PLATEAU;  Surgeon: [name], MD;  Location: [facility] OR;  Service: Orthopedics;  Laterality: Left;   RIGHT OOPHORECTOMY     TONSILLECTOMY AND ADENOIDECTOMY     VIDEO ASSISTED THORACOSCOPY (VATS)/WEDGE RESECTION Left 09/30/2017   Procedure: VIDEO ASSISTED THORACOSCOPY (VATS)/LUNG RESECTION;  Surgeon: [name], MD;  Location: [facility] OR;  Service: Thoracic;  Laterality: Left;   VIDEO BRONCHOSCOPY N/A 09/30/2017   Procedure: VIDEO BRONCHOSCOPY;  Surgeon: [name], MD;  Location: [facility] OR;  Service: Thoracic;  Laterality: N/A;   WISDOM TOOTH EXTRACTION      REVIEW OF SYSTEMS:   Review of Systems  Constitutional: Negative for appetite change, chills, fatigue, fever and unexpected weight change.  HENT:   Negative for mouth sores, nosebleeds, sore throat and trouble swallowing.   Eyes: Negative for eye problems and icterus.  Respiratory: Negative for cough, hemoptysis, shortness of breath  and wheezing.   Cardiovascular: Negative for chest pain and leg swelling.  Gastrointestinal: Negative for abdominal pain, constipation, diarrhea, nausea and vomiting.  Genitourinary: Negative for bladder incontinence, difficulty urinating, dysuria, frequency and hematuria.   Musculoskeletal: Negative for back pain, gait problem, neck pain and neck stiffness.  Skin: Negative for itching and rash.  Neurological: Negative for dizziness, extremity weakness, gait problem, headaches, light-headedness and seizures.  Hematological: Negative for adenopathy. Does not bruise/bleed easily.   Psychiatric/Behavioral: Negative for confusion, depression and sleep disturbance. The patient is not nervous/anxious.     PHYSICAL EXAMINATION:  There were no vitals taken for this visit.  ECOG PERFORMANCE STATUS: {CHL ONC ECOG [id number]  Physical Exam  Constitutional: Oriented to person, place, and time and well-developed, well-nourished, and in no distress. No distress.  HENT:  Head: Normocephalic and atraumatic.  Mouth/Throat: Oropharynx is clear and moist. No oropharyngeal exudate.  Eyes: Conjunctivae are normal. Right eye exhibits no discharge. Left eye exhibits no discharge. No scleral icterus.  Neck: Normal range of motion. Neck supple.  Cardiovascular: Normal rate, regular rhythm, normal heart sounds and intact distal pulses.   Pulmonary/Chest: Effort normal and breath sounds normal. No respiratory distress. No wheezes. No rales.  Abdominal: Soft. Bowel sounds are normal. Exhibits no distension and no mass. There is no tenderness.  Musculoskeletal: Normal range of motion. Exhibits no edema.  Lymphadenopathy:    No cervical adenopathy.  Neurological: Alert and oriented to person, place, and time. Exhibits normal muscle tone. Gait normal. Coordination normal.  Skin: Skin is warm and dry. No rash noted. Not diaphoretic. No erythema. No pallor.  Psychiatric: Mood, memory and judgment normal.  Vitals reviewed.  LABORATORY DATA: Lab Results  Component Value Date   WBC 6.7 11/24/2023   HGB 13.9 11/24/2023   HCT 39.6 11/24/2023   MCV 91.9 11/24/2023   PLT 220 11/24/2023      Chemistry      Component Value Date/Time   NA 140 11/24/2023 1115   K 3.9 11/24/2023 1115   CL 104 11/24/2023 1115   CO2 31 11/24/2023 1115   BUN 12 11/24/2023 1115   CREATININE 0.86 11/24/2023 1115      Component Value Date/Time   CALCIUM 9.6 11/24/2023 1115   ALKPHOS 62 11/24/2023 1115   AST 17 11/24/2023 1115   ALT 12 11/24/2023 1115   BILITOT 0.4 11/24/2023 1115        RADIOGRAPHIC STUDIES:  No results found.   ASSESSMENT/PLAN:  This is a very pleasant 68 year old Caucasian female diagnosed with limited stage small cell lung cancer. She status post resection. She then completed adjuvant treatment with systemic chemotherapy with cisplatin  and etoposide  for 4 cycles. She has been on observation since 2019 and feeling fine.   The patient recently had a restaging CT scan performed. Dr. Marguerita Shih personally and independently reviewed the scan and discussed the results with the patient today. The scan did not show any evidence of disease progression except a small 4 mm nodule that is likely benign inflammatory. Dr. Marguerita Shih recommends that she continue on observation with a restaging CT scan of the chest in 1 year.   We will see her back for follow-up visit at that time to review her scan results.   The patient was advised to call immediately if she has any concerning symptoms in the interval. The patient voices understanding of current disease status and treatment options and is in agreement with the current care plan. All questions were answered. The patient knows  to call the clinic with any problems, questions or concerns. We can certainly see the patient much sooner if necessary       No orders of the defined types were placed in this encounter.    I spent {CHL ONC TIME VISIT - ZHYQM:[id number]} counseling the patient face to face. The total time spent in the appointment was {CHL ONC TIME VISIT - MWUXL:[id number]}.  Secundino Ellithorpe L Laquida Cotrell, PA-C 11/25/23

## 2023-11-27 ENCOUNTER — Inpatient Hospital Stay: Admitting: Physician Assistant

## 2023-11-27 VITALS — BP 135/69 | HR 70 | Temp 98.0°F | Resp 14 | Wt 116.0 lb

## 2023-11-27 DIAGNOSIS — Z902 Acquired absence of lung [part of]: Secondary | ICD-10-CM | POA: Diagnosis not present

## 2023-11-27 DIAGNOSIS — Z85118 Personal history of other malignant neoplasm of bronchus and lung: Secondary | ICD-10-CM | POA: Diagnosis present

## 2023-11-27 DIAGNOSIS — Z9221 Personal history of antineoplastic chemotherapy: Secondary | ICD-10-CM | POA: Diagnosis not present

## 2023-11-27 DIAGNOSIS — C3432 Malignant neoplasm of lower lobe, left bronchus or lung: Secondary | ICD-10-CM

## 2023-11-27 NOTE — Addendum Note (Signed)
 Addended by: Marlene Simas on: 11/27/2023 10:18 PM   Modules accepted: Level of Service

## 2023-11-28 ENCOUNTER — Ambulatory Visit: Payer: Medicare Other | Admitting: Physician Assistant

## 2023-12-01 ENCOUNTER — Other Ambulatory Visit: Payer: Self-pay | Admitting: Family Medicine

## 2023-12-01 DIAGNOSIS — Z1231 Encounter for screening mammogram for malignant neoplasm of breast: Secondary | ICD-10-CM

## 2023-12-01 DIAGNOSIS — M810 Age-related osteoporosis without current pathological fracture: Secondary | ICD-10-CM

## 2024-06-30 ENCOUNTER — Inpatient Hospital Stay (HOSPITAL_BASED_OUTPATIENT_CLINIC_OR_DEPARTMENT_OTHER): Admission: RE | Admit: 2024-06-30 | Source: Ambulatory Visit

## 2024-06-30 ENCOUNTER — Other Ambulatory Visit (HOSPITAL_BASED_OUTPATIENT_CLINIC_OR_DEPARTMENT_OTHER)

## 2024-11-23 ENCOUNTER — Other Ambulatory Visit

## 2024-11-30 ENCOUNTER — Ambulatory Visit: Admitting: Internal Medicine
